# Patient Record
Sex: Male | Born: 1935 | Race: White | Hispanic: No | Marital: Married | Smoking: Never smoker
Health system: Southern US, Community
[De-identification: ages and names within clinical notes are randomized; demographics above are authoritative.]

## PROBLEM LIST (undated history)

## (undated) DIAGNOSIS — I739 Peripheral vascular disease, unspecified: Secondary | ICD-10-CM

## (undated) DIAGNOSIS — I2510 Atherosclerotic heart disease of native coronary artery without angina pectoris: Secondary | ICD-10-CM

## (undated) DIAGNOSIS — K4090 Unilateral inguinal hernia, without obstruction or gangrene, not specified as recurrent: Secondary | ICD-10-CM

## (undated) DIAGNOSIS — E785 Hyperlipidemia, unspecified: Secondary | ICD-10-CM

## (undated) DIAGNOSIS — I4891 Unspecified atrial fibrillation: Secondary | ICD-10-CM

## (undated) DIAGNOSIS — I701 Atherosclerosis of renal artery: Secondary | ICD-10-CM

## (undated) DIAGNOSIS — L570 Actinic keratosis: Secondary | ICD-10-CM

## (undated) DIAGNOSIS — M109 Gout, unspecified: Secondary | ICD-10-CM

## (undated) DIAGNOSIS — I10 Essential (primary) hypertension: Secondary | ICD-10-CM

## (undated) DIAGNOSIS — N183 Chronic kidney disease, stage 3 unspecified: Secondary | ICD-10-CM

## (undated) DIAGNOSIS — I6529 Occlusion and stenosis of unspecified carotid artery: Secondary | ICD-10-CM

## (undated) HISTORY — DX: Essential (primary) hypertension: I10

## (undated) HISTORY — DX: Actinic keratosis: L57.0

## (undated) HISTORY — PX: TONSILLECTOMY: SUR1361

## (undated) HISTORY — PX: OTHER SURGICAL HISTORY: SHX169

## (undated) HISTORY — PX: PROSTATE SURGERY: SHX751

---

## 1993-04-20 DIAGNOSIS — C61 Malignant neoplasm of prostate: Secondary | ICD-10-CM

## 1993-04-20 HISTORY — PX: PROSTATECTOMY: SHX69

## 1993-04-20 HISTORY — DX: Malignant neoplasm of prostate: C61

## 1998-04-20 HISTORY — PX: CORONARY ANGIOPLASTY WITH STENT PLACEMENT: SHX49

## 2004-07-01 ENCOUNTER — Ambulatory Visit: Payer: Self-pay | Admitting: Internal Medicine

## 2006-01-28 IMAGING — US US CAROTID DUPLEX BILAT
1 series · 17 of 24 positions shown · non-contrast
Comparison: none

REASON FOR EXAM: Stenosis
COMMENTS:

[Series 1: us carotid duplex bilat · 17 of 71 slices shown]
[im 1/71]
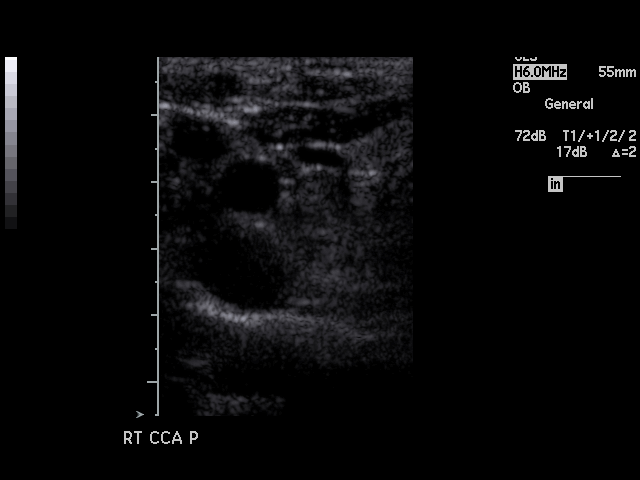
[im 7/71]
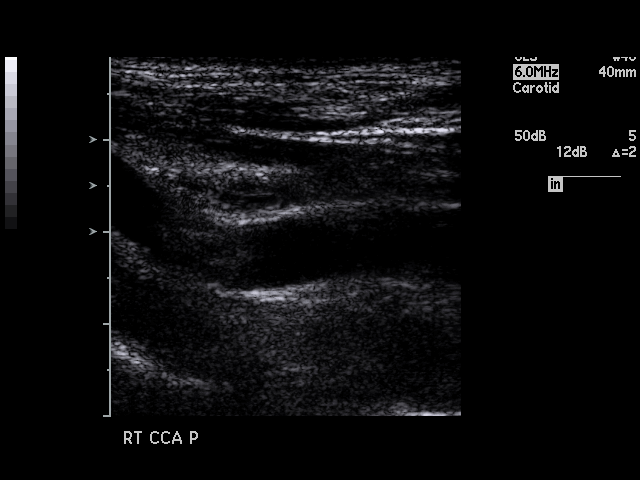
[im 10/71]
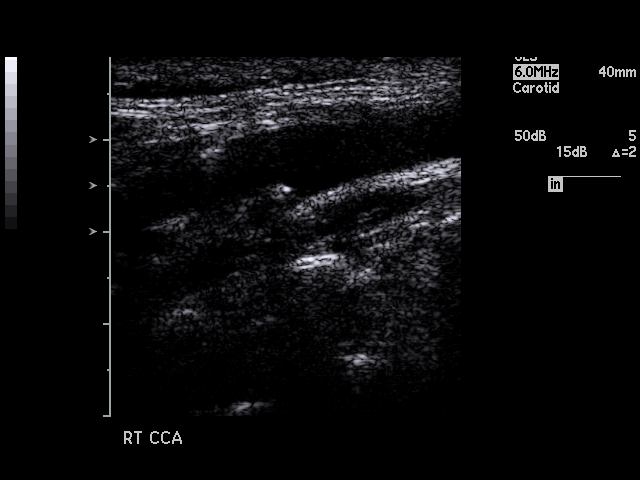
[im 13/71]
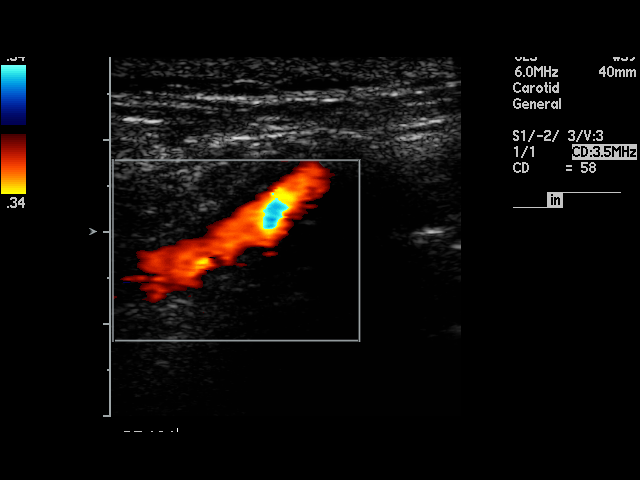
[im 19/71]
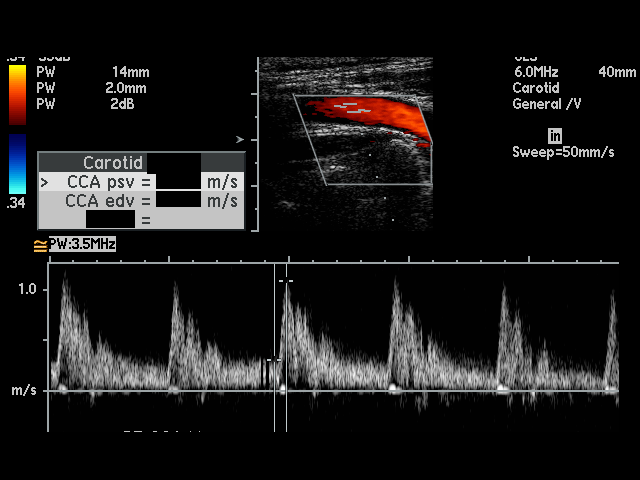
[im 22/71]
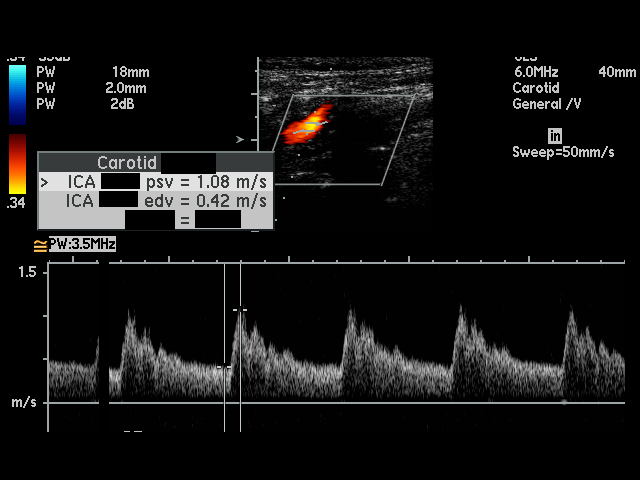
[im 28/71]
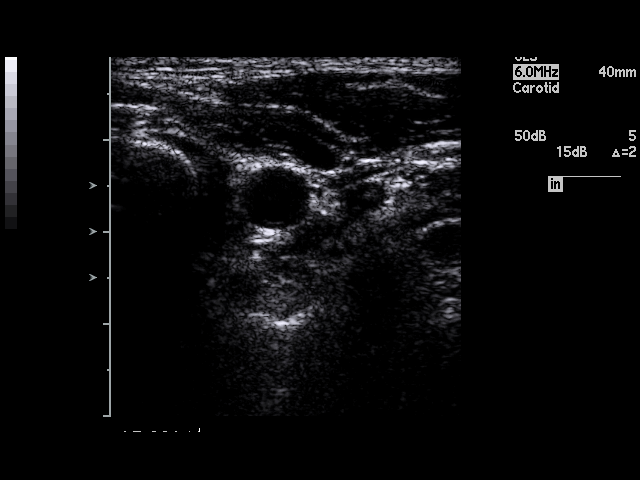
[im 31/71]
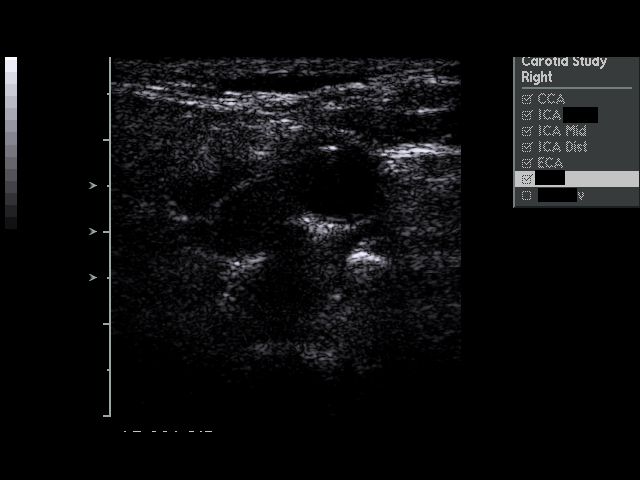
[im 37/71]
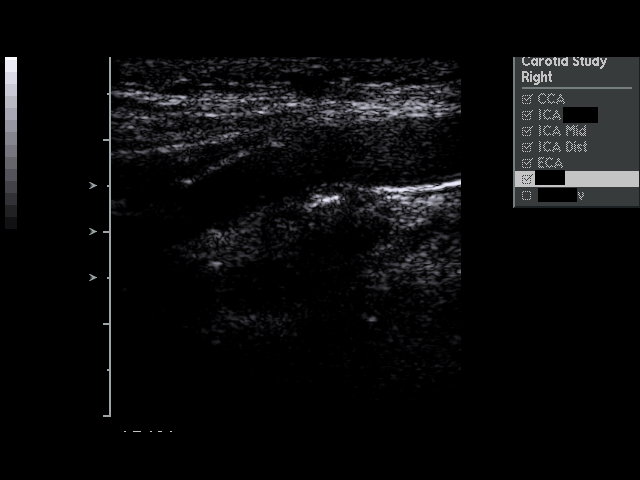
[im 40/71]
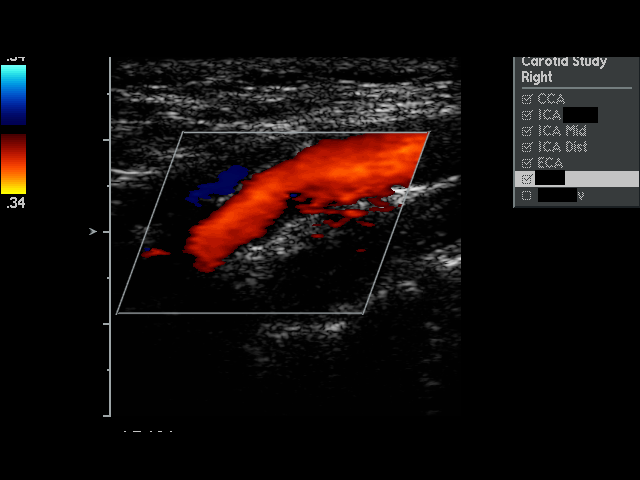
[im 43/71]
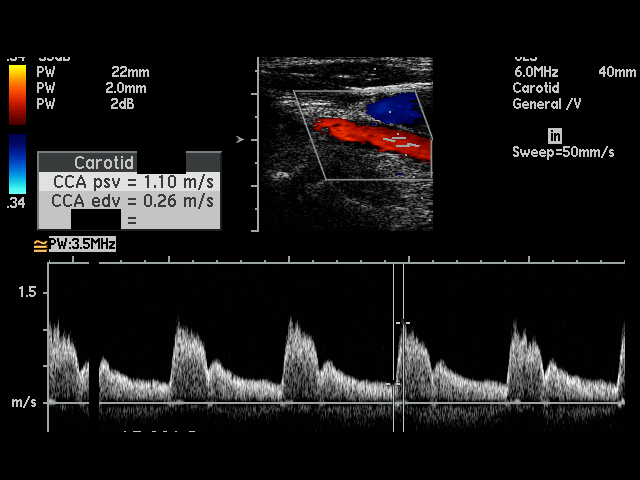
[im 49/71]
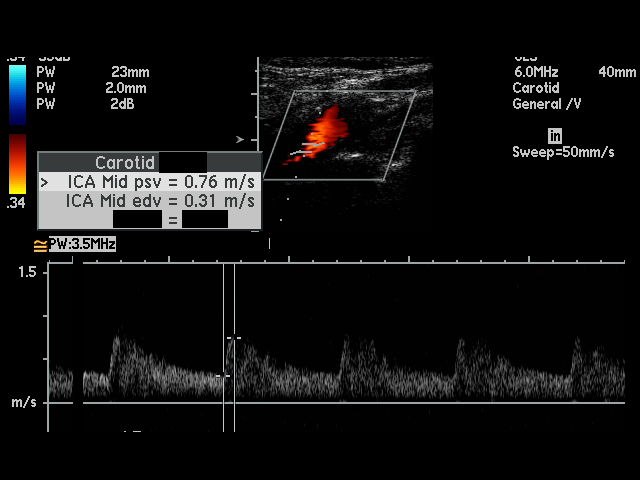
[im 52/71]
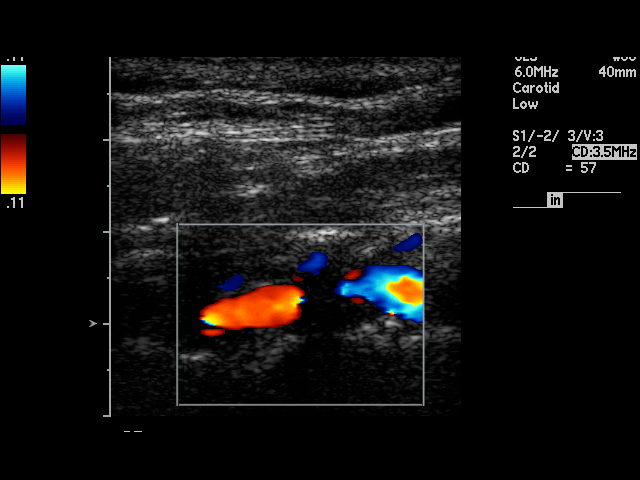
[im 58/71]
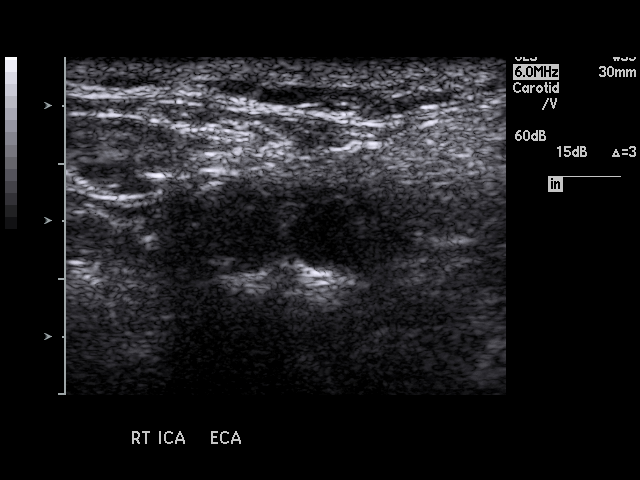
[im 61/71]
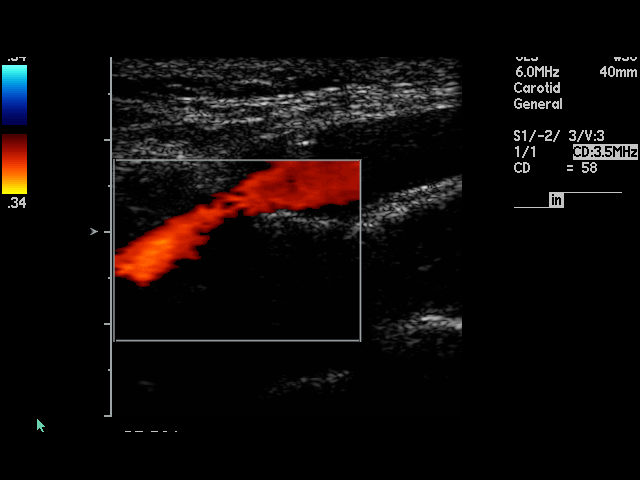
[im 64/71]
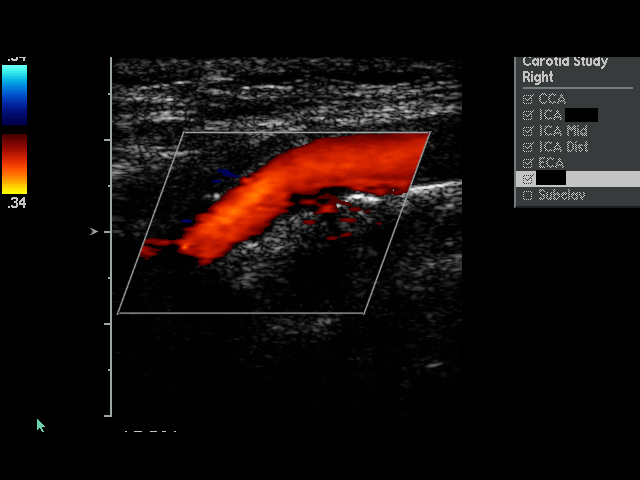
[im 71/71]
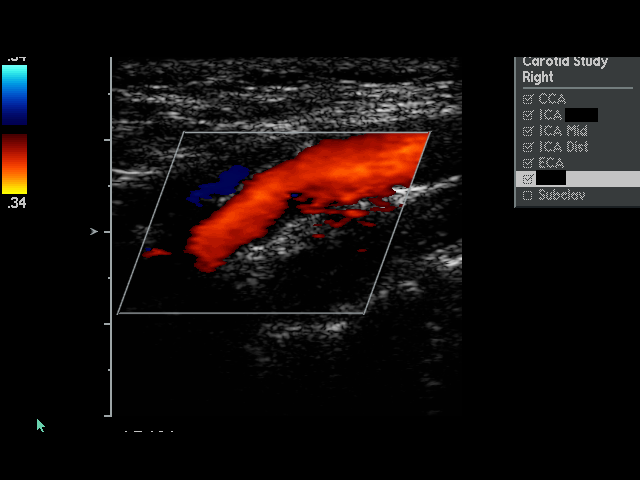

[17 of 24 positions shown; findings below may reference images not displayed]

PROCEDURE:     US  - US CAROTID DOPPLER BILATERAL  - July 01, 2004 [DATE]

RESULT:     There is noted mild calcific plaque formation about the carotid
bifurcations bilaterally.  Also noted is mild soft plaque formation in the
RIGHT internal carotid artery.  On the RIGHT the peak RIGHT common carotid
artery flow velocity measures 1.09 meters per second and the peak RIGHT
internal common carotid artery flow velocity measures 1.08 meters per
second.  The ICA over CCA ratio is 0.985.  On the LEFT the peak LEFT common
carotid artery flow velocity measures 1.17 meters per second and the peak
LEFT internal carotid artery flow velocity measures .770 meters per second.
ICA over CCA ratio is 0.658.  These values bilaterally are compatible with
the absence of hemodynamically significant stenosis.

Antegrade flow is noted in both vertebrals.
IMPRESSION: Mild plaque formation is seen bilaterally.

No hemodynamically significant stenosis is observed on either side.

Antegrade flow is noted in both vertebrals.

## 2006-06-09 ENCOUNTER — Ambulatory Visit: Payer: Self-pay | Admitting: Internal Medicine

## 2013-12-25 ENCOUNTER — Inpatient Hospital Stay: Payer: Self-pay | Admitting: Internal Medicine

## 2013-12-25 DIAGNOSIS — I214 Non-ST elevation (NSTEMI) myocardial infarction: Secondary | ICD-10-CM

## 2013-12-25 HISTORY — DX: Non-ST elevation (NSTEMI) myocardial infarction: I21.4

## 2013-12-25 LAB — BASIC METABOLIC PANEL
Anion Gap: 8 (ref 7–16)
BUN: 28 mg/dL — ABNORMAL HIGH (ref 7–18)
CREATININE: 1.57 mg/dL — AB (ref 0.60–1.30)
Calcium, Total: 9 mg/dL (ref 8.5–10.1)
Chloride: 104 mmol/L (ref 98–107)
Co2: 28 mmol/L (ref 21–32)
EGFR (African American): 49 — ABNORMAL LOW
GFR CALC NON AF AMER: 42 — AB
Glucose: 100 mg/dL — ABNORMAL HIGH (ref 65–99)
Osmolality: 285 (ref 275–301)
Potassium: 3.4 mmol/L — ABNORMAL LOW (ref 3.5–5.1)
Sodium: 140 mmol/L (ref 136–145)

## 2013-12-25 LAB — LIPID PANEL
Cholesterol: 261 mg/dL — ABNORMAL HIGH (ref 0–200)
HDL Cholesterol: 39 mg/dL — ABNORMAL LOW (ref 40–60)
Ldl Cholesterol, Calc: 178 mg/dL — ABNORMAL HIGH (ref 0–100)
TRIGLYCERIDES: 221 mg/dL — AB (ref 0–200)
VLDL Cholesterol, Calc: 44 mg/dL — ABNORMAL HIGH (ref 5–40)

## 2013-12-25 LAB — CK-MB
CK-MB: 4.4 ng/mL — AB (ref 0.5–3.6)
CK-MB: 72.6 ng/mL — ABNORMAL HIGH (ref 0.5–3.6)
CK-MB: 56.6 ng/mL — ABNORMAL HIGH (ref 0.5–3.6)

## 2013-12-25 LAB — CBC
HCT: 44.6 % (ref 40.0–52.0)
HGB: 14.8 g/dL (ref 13.0–18.0)
MCH: 30.4 pg (ref 26.0–34.0)
MCHC: 33.1 g/dL (ref 32.0–36.0)
MCV: 92 fL (ref 80–100)
PLATELETS: 215 10*3/uL (ref 150–440)
RBC: 4.85 10*6/uL (ref 4.40–5.90)
RDW: 13.2 % (ref 11.5–14.5)
WBC: 7.2 10*3/uL (ref 3.8–10.6)

## 2013-12-25 LAB — TROPONIN I
TROPONIN-I: 0.37 ng/mL — AB
Troponin-I: 20 ng/mL — ABNORMAL HIGH
Troponin-I: 29 ng/mL — ABNORMAL HIGH

## 2013-12-25 LAB — PROTIME-INR
INR: 1
Prothrombin Time: 12.6 secs (ref 11.5–14.7)

## 2013-12-25 LAB — APTT: Activated PTT: 30.9 secs (ref 23.6–35.9)

## 2013-12-26 LAB — CBC WITH DIFFERENTIAL/PLATELET
Basophil #: 0 10*3/uL (ref 0.0–0.1)
Basophil %: 0.3 %
EOS ABS: 0.2 10*3/uL (ref 0.0–0.7)
Eosinophil %: 1.9 %
HCT: 39.4 % — ABNORMAL LOW (ref 40.0–52.0)
HGB: 13.3 g/dL (ref 13.0–18.0)
Lymphocyte #: 1.1 10*3/uL (ref 1.0–3.6)
Lymphocyte %: 13.2 %
MCH: 30.7 pg (ref 26.0–34.0)
MCHC: 33.8 g/dL (ref 32.0–36.0)
MCV: 91 fL (ref 80–100)
Monocyte #: 0.6 x10 3/mm (ref 0.2–1.0)
Monocyte %: 7.6 %
NEUTROS ABS: 6.4 10*3/uL (ref 1.4–6.5)
Neutrophil %: 77 %
Platelet: 243 10*3/uL (ref 150–440)
RBC: 4.35 10*6/uL — ABNORMAL LOW (ref 4.40–5.90)
RDW: 13.6 % (ref 11.5–14.5)
WBC: 8.3 10*3/uL (ref 3.8–10.6)

## 2013-12-26 LAB — BASIC METABOLIC PANEL
Anion Gap: 7 (ref 7–16)
BUN: 30 mg/dL — AB (ref 7–18)
CALCIUM: 8.5 mg/dL (ref 8.5–10.1)
CHLORIDE: 105 mmol/L (ref 98–107)
CO2: 30 mmol/L (ref 21–32)
Creatinine: 1.85 mg/dL — ABNORMAL HIGH (ref 0.60–1.30)
EGFR (African American): 40 — ABNORMAL LOW
EGFR (Non-African Amer.): 34 — ABNORMAL LOW
GLUCOSE: 115 mg/dL — AB (ref 65–99)
Osmolality: 290 (ref 275–301)
POTASSIUM: 4.3 mmol/L (ref 3.5–5.1)
Sodium: 142 mmol/L (ref 136–145)

## 2013-12-26 LAB — HEPARIN LEVEL (UNFRACTIONATED)
ANTI-XA(UNFRACTIONATED): 0.32 [IU]/mL (ref 0.30–0.70)
ANTI-XA(UNFRACTIONATED): 0.2 [IU]/mL — AB (ref 0.30–0.70)

## 2013-12-27 HISTORY — PX: CARDIAC CATHETERIZATION: SHX172

## 2013-12-27 LAB — BASIC METABOLIC PANEL
Anion Gap: 7 (ref 7–16)
BUN: 22 mg/dL — ABNORMAL HIGH (ref 7–18)
Calcium, Total: 7.8 mg/dL — ABNORMAL LOW (ref 8.5–10.1)
Chloride: 109 mmol/L — ABNORMAL HIGH (ref 98–107)
Co2: 26 mmol/L (ref 21–32)
Creatinine: 1.55 mg/dL — ABNORMAL HIGH (ref 0.60–1.30)
EGFR (African American): 49 — ABNORMAL LOW
GFR CALC NON AF AMER: 43 — AB
GLUCOSE: 94 mg/dL (ref 65–99)
Osmolality: 286 (ref 275–301)
POTASSIUM: 3.7 mmol/L (ref 3.5–5.1)
SODIUM: 142 mmol/L (ref 136–145)

## 2013-12-27 LAB — CBC WITH DIFFERENTIAL/PLATELET
Basophil #: 0 10*3/uL (ref 0.0–0.1)
Basophil %: 0.4 %
EOS ABS: 0.2 10*3/uL (ref 0.0–0.7)
Eosinophil %: 2.4 %
HCT: 36.6 % — ABNORMAL LOW (ref 40.0–52.0)
HGB: 12 g/dL — AB (ref 13.0–18.0)
Lymphocyte #: 1.1 10*3/uL (ref 1.0–3.6)
Lymphocyte %: 15.3 %
MCH: 30.4 pg (ref 26.0–34.0)
MCHC: 32.9 g/dL (ref 32.0–36.0)
MCV: 93 fL (ref 80–100)
Monocyte #: 0.6 x10 3/mm (ref 0.2–1.0)
Monocyte %: 8.2 %
NEUTROS PCT: 73.7 %
Neutrophil #: 5.4 10*3/uL (ref 1.4–6.5)
Platelet: 196 10*3/uL (ref 150–440)
RBC: 3.96 10*6/uL — ABNORMAL LOW (ref 4.40–5.90)
RDW: 13.2 % (ref 11.5–14.5)
WBC: 7.3 10*3/uL (ref 3.8–10.6)

## 2013-12-27 LAB — APTT: Activated PTT: 74 secs — ABNORMAL HIGH (ref 23.6–35.9)

## 2013-12-27 LAB — HEPARIN LEVEL (UNFRACTIONATED): ANTI-XA(UNFRACTIONATED): 0.19 [IU]/mL — AB (ref 0.30–0.70)

## 2013-12-28 LAB — CBC WITH DIFFERENTIAL/PLATELET
BASOS PCT: 0.4 %
Basophil #: 0 10*3/uL (ref 0.0–0.1)
Eosinophil #: 0.2 10*3/uL (ref 0.0–0.7)
Eosinophil %: 3.7 %
HCT: 35.1 % — ABNORMAL LOW (ref 40.0–52.0)
HGB: 11.8 g/dL — ABNORMAL LOW (ref 13.0–18.0)
LYMPHS ABS: 0.9 10*3/uL — AB (ref 1.0–3.6)
LYMPHS PCT: 15.6 %
MCH: 30.8 pg (ref 26.0–34.0)
MCHC: 33.7 g/dL (ref 32.0–36.0)
MCV: 92 fL (ref 80–100)
Monocyte #: 0.5 x10 3/mm (ref 0.2–1.0)
Monocyte %: 7.8 %
NEUTROS PCT: 72.5 %
Neutrophil #: 4.3 10*3/uL (ref 1.4–6.5)
Platelet: 176 10*3/uL (ref 150–440)
RBC: 3.83 10*6/uL — ABNORMAL LOW (ref 4.40–5.90)
RDW: 13.4 % (ref 11.5–14.5)
WBC: 5.9 10*3/uL (ref 3.8–10.6)

## 2013-12-28 LAB — BASIC METABOLIC PANEL
Anion Gap: 7 (ref 7–16)
BUN: 17 mg/dL (ref 7–18)
CALCIUM: 8 mg/dL — AB (ref 8.5–10.1)
CREATININE: 1.39 mg/dL — AB (ref 0.60–1.30)
Chloride: 109 mmol/L — ABNORMAL HIGH (ref 98–107)
Co2: 27 mmol/L (ref 21–32)
EGFR (African American): 56 — ABNORMAL LOW
EGFR (Non-African Amer.): 49 — ABNORMAL LOW
GLUCOSE: 96 mg/dL (ref 65–99)
Osmolality: 286 (ref 275–301)
Potassium: 3.3 mmol/L — ABNORMAL LOW (ref 3.5–5.1)
Sodium: 143 mmol/L (ref 136–145)

## 2014-05-14 DIAGNOSIS — C61 Malignant neoplasm of prostate: Secondary | ICD-10-CM | POA: Insufficient documentation

## 2014-08-11 NOTE — Consult Note (Signed)
Brief Consult Note: Diagnosis: NQMI/[place]/CAD.   Patient was seen by consultant.   Consult note dictated.   Recommend further assessment or treatment.   Orders entered.   Discussed with Attending MD.   Comments: IMP NQMI CAD [place] Hyperlipidemia HTN ASA allergy . PLAN Tele Plavix load and maintence B-blocker therapy No ACE because of allergy No asa because of allergy Statin therapy Aggrastat pre cath Rec cardiac cath tomorrow Bp control b-blocker and amilodapine Consider ARB? Agree with heparin IV Hydration pre cath.  Electronic Signatures: Lujean Amel D (MD)  (Signed 07-Sep-15 20:39)  Authored: Brief Consult Note   Last Updated: 07-Sep-15 20:39 by Yolonda Kida (MD)

## 2014-08-11 NOTE — Discharge Summary (Signed)
PATIENT NAME:  Timothy Goodwin, Timothy Goodwin MR#:  [id number] DATE OF BIRTH:  1934/11/22  DATE OF ADMISSION:  12/25/2013 DATE OF DISCHARGE:  12/28/2013  DIAGNOSES AT TIME OF DISCHARGE:  1. Non-ST-elevation myocardial infarction.  2. Right renal artery stenosis.  3. Cardiac catheterization showing moderate to severe single vessel disease.  4. Acute on chronic renal failure.   CHIEF COMPLAINT: Chest pain.   HISTORY OF PRESENT ILLNESS: Timothy Goodwin is a 80 year old male with a history of CAD status post previous stent in 2000, history of hypertension, history of prostate cancer, who presented to the ED complaining of chest pain. The patient stated that he developed tightness in his chest that was non-radiating and subsequently felt hot, flushing and sweating for a few minutes. The patient reportedly is allergic to aspirin and received Plavix. EKG did not show any ST-T changes.   PAST MEDICAL HISTORY: Significant for CAD status post stent at [facility] in 2000, history of hypertension, prostate cancer.   PAST SURGICAL HISTORY: Significant for cardiac stent placement, prostate resection. Please see H and P for other details.   HOSPITAL COURSE: The patient was admitted to [facility], was seen by cardiologist Dr. Clayborn Bigness and underwent a cardiac catheterization. Cardiac catheterization showed EF of 55%, left main was normal, LAD showed 50% proximal, 50% mid, and 50% distal, diagonal showed 50% mid, circumflex was large with ectasia, proximal/mid 50% occlusion, RCA was nondominant, 50% mid occlusion, and OM 1 was 90% proximal with stent, and 50% distal with stent.  The renal arteries showed right renal artery 90% proximal stenosis. The patient was advised medical therapy with Plavix, statins, and beta blockers. During his stay in the hospital the patient did have elevated creatinine and was seen by nephrologist, Dr. Juleen China.  With fluids his serum creatinine came down to 1.39  just prior to cardiac catheterization. The  patient's enzymes were elevated with troponin going up as high as 29, initial troponin was 0.37, repeat troponin was 20, and final troponin level was 29.  The patient remained chest pain-free and was stable at the time of discharge. He was discharged on the following medications.    DISCHARGE MEDICATIONS:  Isosorbide mononitrate extended release 30 mg 1 tablet a day, simvastatin 20 mg once a day, clopidogrel 75 mg once a day, metoprolol tartrate 25 mg p.o. b.i.d., and allopurinol as before. He was advised to hold his HCTZ.   FOLLOWUP:  He has been advised to follow up with Dr. Clayborn Bigness, cardiologist with me, Dr. Ginette Pitman, and also follow up with Dr. Juleen China, nephrologist. The patient is stable at the time of discharge.   TOTAL TIME SPENT IN DISCHARGING THE PATIENT: 35 minutes.     ____________________________ Tracie Harrier, MD vh:bu D: 12/28/2013 17:16:40 ET T: 12/28/2013 18:23:01 ET JOB#: [id number]  cc: Tracie Harrier, MD, <Dictator> Tracie Harrier MD ELECTRONICALLY SIGNED 01/09/2014 17:55

## 2014-08-11 NOTE — H&P (Signed)
PATIENT NAME:  Timothy Goodwin, Timothy Goodwin MR#:  [id number] DATE OF BIRTH:  1934-11-09  DATE OF ADMISSION:  12/25/2013  ADMITTING PHYSICIAN: Gladstone Lighter, MD   PRIMARY CARE PHYSICIAN: Tracie Harrier, MD  CHIEF COMPLAINT: Chest pain.   HISTORY OF PRESENT ILLNESS: Timothy Goodwin is a 80 year old Caucasian male with past medical history significant for coronary artery disease, status post stent in the year 2000, hypertension, history of prostate cancer, presents from home secondary to chest pain that started this morning. The patient says that he was doing fine this morning. He woke up, he did not have any symptoms, ate his breakfast. Around 11:00 this morning he suddenly developed tightness in his chest that was nonradiating, but he felt hot, flushy and sweaty for a couple of minutes. The other symptoms improved but the tightness stayed for almost a couple of hours, he presented to the ER. His first troponin is positive at 0.37. THE PATIENT IS ANAPHYLACTIC TO ASPIRIN. So he has been getting a loading dose of Plavix at this time and is being admitted to cardiac floor for non-STEMI. His EKG does not show any acute ST-T wave changes. His chest pain is much improved at this time.   PAST MEDICAL HISTORY:  1.  Coronary artery disease, status post stent, likely in his circumflex artery in the year 2000 at [facility].  2.  Hypertension.  3.  Prostate cancer.   PAST SURGICAL HISTORY:  1.  Cardiac stent placement.  2.  Prostate resection.   ALLERGIES TO MEDICATIONS:  1.  HE IS ANAPHYLACTIC TO ASPIRIN AS IT CAUSED ANGIOEDEMA, IT WAS PROVED BY ALLERGY TESTING BY IMMUNOLOGIST.  2.  ANAPHYLACTIC TO NONSTEROIDAL ANTI-INFLAMMATORY ALSO AND WAS ALSO ADVISED TO STAY AWAY FROM ACE INHIBITORS.  CURRENT HOME MEDICATIONS:  1.  Hydrochlorothiazide. He takes 1-1/2 pills per day, does not know the strength.  2.  Allopurinol for gout symptoms, 1 tablet daily, unknown strength.   SOCIAL HISTORY: Lives at home with his wife.  No smoking or alcohol use.   FAMILY HISTORY: Both parents passed away from cancer. His mom had ovarian cancer and dad had prostate cancer.   REVIEW OF SYSTEMS:  CONSTITUTIONAL: No fever, fatigue or weakness.  EYES: No blurred vision, double vision, inflammation or glaucoma. Uses reading glasses.  EARS, NOSE AND THROAT: No tinnitus, ear pain, hearing loss, epistaxis or discharge.  RESPIRATORY: No cough, wheeze, hemoptysis or COPD.  CARDIOVASCULAR: Positive for chest pain. No orthopnea, edema, arrhythmia, palpitations or syncope.  GASTROINTESTINAL: No nausea, vomiting, diarrhea, abdominal pain, hematemesis or melena.  GENITOURINARY: No dysuria, hematuria, renal calculus, frequency or incontinence.  ENDOCRINE: No polyuria, nocturia or thyroid problems, heat or cold intolerance.  HEMATOLOGY: No anemia, easy bruising or bleeding.  SKIN: No acne, rash or lesions.  MUSCULOSKELETAL: No neck, back, shoulder pain, arthritis or gout.  NEUROLOGIC: No numbness, weakness, CVA, TIA or seizure.  PSYCHOLOGIC: No anxiety, insomnia, depression.   PHYSICAL EXAMINATION:  VITAL SIGNS: Temperature 98.2 degrees Fahrenheit, pulse 72, respirations 18, blood pressure 208/105, pulse oximetry 97% on room air.  GENERAL: Well-built, well-nourished male, sitting in bed, not in any acute distress.  HEENT: Normocephalic, atraumatic. Pupils equal, round, reacting to light. Anicteric sclerae. Extraocular movements intact. Oropharynx is clear without erythema, mass or exudates.  NECK: Supple. No thyromegaly, JVD or carotid bruits. No lymphadenopathy.  LUNGS: Moving air bilaterally. No wheeze or crackles. No use of accessory muscles for breathing.  CARDIOVASCULAR: S1, S2, regular rate and rhythm. No murmurs, rubs or gallops.  ABDOMEN:  Soft, nontender, nondistended. No hepatosplenomegaly. Normal bowel sounds.  EXTREMITIES: No pedal edema. No clubbing or cyanosis, 2+ dorsalis pedis pulses palpable bilaterally.  SKIN: No acne,  rash or lesions.  LYMPHATICS: No cervical lymphadenopathy.  NEUROLOGIC: Cranial nerves intact. No focal motor or sensory deficits.  PSYCHOLOGICAL: The patient is awake, alert, oriented x 3.   LABORATORY DATA: WBC 7.2, hemoglobin 14.8, hematocrit 44.6, platelet count 215,000.   Sodium 140, potassium 3.4, chloride 104, bicarbonate 28, BUN 28, creatinine 1.52 and glucose 100 and calcium of 9.0. CK-MB 4.4. Troponin 0.37.   Chest x-ray: Clear lung fields, thoracic spondylosis and tortuous aorta with atherosclerotic calcifications seen but no acute findings. EKG is normal sinus rhythm, no acute ST-T wave abnormalities. Heart rate of 75.   ASSESSMENT AND PLAN: A 80 year old male with prior history of coronary artery disease, status post stents, hypertension is being admitted for non-ST segment elevation myocardial infarction.  1.  Non- ST segment elevation myocardial infarction. We will admit the patient to telemetry. Recycle troponins. Started on heparin drip. Cardiology has been consulted. Dr. Clayborn Bigness saw the patient and recommended cardiac catheterization tomorrow morning. THE PATIENT HAS AN ANAPHYLACTIC REACTION TO ASPIRIN, SO WE ARE GIVING PLAVIX LOADING DOSE TODAY AND DAILY FROM TOMORROW. Imdur has been added for the nitroglycerin complement. Metoprolol statin, also started. Lipid profile followup and echocardiogram has been ordered.  2.  Hypertension. Hold hydrochlorothiazide as he is hypokalemic and mild renal failure. Start IV fluids, IV hydralazine, p.r.n. metoprolol and Imdur.  3.  Acute renal failure and hypokalemia, likely from hydrochlorothiazide. Hold hydrochlorothiazide. IV fluids and recheck.  4.  History of prostate cancer, has been treated in the past.   5.  Deep veinous thrombosis prophylaxis.   CODE STATUS: Full code.   TIME SPENT ON ADMISSION: Fifty minutes.    ____________________________ Gladstone Lighter, MD rk:TT D: 12/25/2013 15:15:00 ET T: 12/25/2013 15:41:47  ET JOB#: [id number]  cc: Gladstone Lighter, MD, <Dictator> Tracie Harrier, MD Gladstone Lighter MD ELECTRONICALLY SIGNED 12/25/2013 17:42

## 2014-11-12 DIAGNOSIS — N1832 Chronic kidney disease, stage 3b: Secondary | ICD-10-CM | POA: Insufficient documentation

## 2014-11-12 DIAGNOSIS — N183 Chronic kidney disease, stage 3 unspecified: Secondary | ICD-10-CM | POA: Insufficient documentation

## 2016-01-15 ENCOUNTER — Other Ambulatory Visit: Payer: Self-pay | Admitting: Internal Medicine

## 2016-01-15 ENCOUNTER — Ambulatory Visit
Admission: RE | Admit: 2016-01-15 | Discharge: 2016-01-15 | Disposition: A | Discharge status: Home / Self Care | Payer: Medicare Other | Source: Ambulatory Visit | Attending: Internal Medicine | Admitting: Internal Medicine

## 2016-01-15 DIAGNOSIS — M7989 Other specified soft tissue disorders: Secondary | ICD-10-CM | POA: Diagnosis present

## 2016-06-04 ENCOUNTER — Other Ambulatory Visit (INDEPENDENT_AMBULATORY_CARE_PROVIDER_SITE_OTHER): Payer: Self-pay | Admitting: Vascular Surgery

## 2016-06-04 DIAGNOSIS — I701 Atherosclerosis of renal artery: Secondary | ICD-10-CM

## 2016-06-05 ENCOUNTER — Ambulatory Visit (INDEPENDENT_AMBULATORY_CARE_PROVIDER_SITE_OTHER): Payer: Medicare Other | Admitting: Vascular Surgery

## 2016-06-05 ENCOUNTER — Encounter (INDEPENDENT_AMBULATORY_CARE_PROVIDER_SITE_OTHER): Payer: Self-pay | Admitting: Vascular Surgery

## 2016-06-05 ENCOUNTER — Ambulatory Visit (INDEPENDENT_AMBULATORY_CARE_PROVIDER_SITE_OTHER): Payer: Medicare Other

## 2016-06-05 VITALS — BP 197/96 | HR 70 | Resp 16 | Ht 66.0 in | Wt 177.0 lb

## 2016-06-05 DIAGNOSIS — I10 Essential (primary) hypertension: Secondary | ICD-10-CM | POA: Insufficient documentation

## 2016-06-05 DIAGNOSIS — I701 Atherosclerosis of renal artery: Secondary | ICD-10-CM

## 2016-06-05 NOTE — Assessment & Plan Note (Signed)
His renal artery duplex today would suggest less than 60% renal artery stenosis bilaterally. These velocities are better than they were his last study one year ago. At this point, I will extend his ultrasound scheduled check him in 18-24 months. If he has worsening blood pressure in the interim, he will call our office.

## 2016-06-05 NOTE — Assessment & Plan Note (Signed)
His blood pressure control is good renal artery duplex does not demonstrate high-grade stenosis. Continue current medical regimen. Blood pressure control is important in reducing progression of atherosclerotic disease.

## 2016-06-05 NOTE — Progress Notes (Signed)
MRN : [id number]  Timothy Goodwin is a 82 y.o. (05/10/1934) male who presents with chief complaint of  Chief Complaint  Patient presents with  . Follow-up  .  History of Present Illness: Patient returns today in follow up of His hypertension and renal artery stenosis. He says his blood pressure has been very well controlled without significant issues. He is on 3 antihypertensives. As far as he knows, his renal function remains normal. His renal artery duplex today would suggest less than 60% renal artery stenosis bilaterally. These velocities are better than they were his last study one year ago.  Current Outpatient Prescriptions  Medication Sig Dispense Refill  . allopurinol (ZYLOPRIM) 100 MG tablet TAKE 1 TABLET DAILY    . amLODipine (NORVASC) 10 MG tablet Take by mouth.    . calcium carbonate 1250 MG capsule Take by mouth.    . cloNIDine (CATAPRES) 0.1 MG tablet Take by mouth.    . clopidogrel (PLAVIX) 75 MG tablet     . isosorbide mononitrate (IMDUR) 30 MG 24 hr tablet     . magnesium 30 MG tablet Take 30 mg by mouth 2 (two) times daily.    . metoprolol tartrate (LOPRESSOR) 25 MG tablet     . Multiple Vitamin (MULTIVITAMIN) capsule Take 1 capsule by mouth daily.    . TURMERIC PO Take by mouth.    . vitamin C (ASCORBIC ACID) 500 MG tablet Take by mouth.    . Vitamin D, Ergocalciferol, (DRISDOL) 50000 units CAPS capsule     . vitamin E 200 UNIT capsule Take by mouth.     No current facility-administered medications for this visit.     Past Medical History:  Diagnosis Date  . Hypertension     Past Surgical History:  Procedure Laterality Date  . heart stent placement    . PROSTATE SURGERY      Social History Social History  Substance Use Topics  . Smoking status: Never Smoker  . Smokeless tobacco: Never Used  . Alcohol use Yes     Comment: occasionally      Family History No bleeding or clotting disorders  Allergies  Allergen Reactions  . Aspirin Shortness Of  Breath  . Lisinopril Shortness Of Breath  . Sulfa Antibiotics Swelling and Rash     REVIEW OF SYSTEMS (Negative unless checked)  Constitutional: [] Weight loss  [] Fever  [] Chills Cardiac: [] Chest pain   [] Chest pressure   [] Palpitations   [] Shortness of breath when laying flat   [] Shortness of breath at rest   [] Shortness of breath with exertion. Vascular:  [] Pain in legs with walking   [] Pain in legs at rest   [] Pain in legs when laying flat   [] Claudication   [] Pain in feet when walking  [] Pain in feet at rest  [] Pain in feet when laying flat   [] History of DVT   [] Phlebitis   [] Swelling in legs   [] Varicose veins   [] Non-healing ulcers Pulmonary:   [] Uses home oxygen   [] Productive cough   [] Hemoptysis   [] Wheeze  [] COPD   [] Asthma Neurologic:  [] Dizziness  [] Blackouts   [] Seizures   [] History of stroke   [] History of TIA  [] Aphasia   [] Temporary blindness   [] Dysphagia   [] Weakness or numbness in arms   [] Weakness or numbness in legs Musculoskeletal:  [x] Arthritis   [] Joint swelling   [] Joint pain   [] Low back pain Hematologic:  [] Easy bruising  [] Easy bleeding   [] Hypercoagulable state   []   Anemic   Gastrointestinal:  [] Blood in stool   [] Vomiting blood  [] Gastroesophageal reflux/heartburn   [] Abdominal pain Genitourinary:  [] Chronic kidney disease   [] Difficult urination  [] Frequent urination  [] Burning with urination   [] Hematuria Skin:  [] Rashes   [] Ulcers   [] Wounds Psychological:  [] History of anxiety   []  History of major depression.  Physical Examination  BP (!) 197/96   Pulse 70   Resp 16   Ht 5\' 6"  (1.676 m)   Wt 177 lb (80.3 kg)   BMI 28.57 kg/m  Gen:  WD/WN, NAD. Appears younger than stated age Head: [place]/AT, No temporalis wasting. Ear/Nose/Throat: Hearing grossly intact, nares w/o erythema or drainage, trachea midline Eyes: Conjunctiva clear. Sclera non-icteric Neck: Supple.  No JVD.  Pulmonary:  Good air movement, no use of accessory muscles.  Cardiac: RRR, normal S1,  S2 Vascular:  Vessel Right Left  Radial Palpable Palpable                                   Gastrointestinal: soft, non-tender/non-distended.  Musculoskeletal: M/S 5/5 throughout.  No deformity or atrophy.  Neurologic: Sensation grossly intact in extremities.  Symmetrical.  Speech is fluent.  Psychiatric: Judgment intact, Mood & affect appropriate for pt's clinical situation. Dermatologic: No rashes or ulcers noted.  No cellulitis or open wounds. Lymph : No Cervical, Axillary, or Inguinal lymphadenopathy.      Labs No results found for this or any previous visit (from the past 2160 hour(s)).  Radiology No results found.    Assessment/Plan  Essential hypertension His blood pressure control is good renal artery duplex does not demonstrate high-grade stenosis. Continue current medical regimen. Blood pressure control is important in reducing progression of atherosclerotic disease.  Renal artery stenosis (HCC) His renal artery duplex today would suggest less than 60% renal artery stenosis bilaterally. These velocities are better than they were his last study one year ago. At this point, I will extend his ultrasound scheduled check him in 18-24 months. If he has worsening blood pressure in the interim, he will call our office.    Leotis Pain, MD  06/05/2016 9:13 AM    This note was created with Dragon medical transcription system.  Any errors from dictation are purely unintentional

## 2017-04-20 HISTORY — PX: SMALL BOWEL ENTEROSCOPY: SHX2415

## 2018-03-28 ENCOUNTER — Other Ambulatory Visit: Payer: Self-pay

## 2018-03-28 ENCOUNTER — Emergency Department: Payer: Medicare PPO

## 2018-03-28 ENCOUNTER — Inpatient Hospital Stay
Admission: EM | Admit: 2018-03-28 | Discharge: 2018-03-31 | DRG: 281 | Disposition: A | Discharge status: Other Acute Inpatient Hospital | Payer: Medicare PPO | Attending: Internal Medicine | Admitting: Internal Medicine

## 2018-03-28 DIAGNOSIS — E785 Hyperlipidemia, unspecified: Secondary | ICD-10-CM | POA: Diagnosis present

## 2018-03-28 DIAGNOSIS — K219 Gastro-esophageal reflux disease without esophagitis: Secondary | ICD-10-CM | POA: Diagnosis present

## 2018-03-28 DIAGNOSIS — I161 Hypertensive emergency: Secondary | ICD-10-CM | POA: Diagnosis present

## 2018-03-28 DIAGNOSIS — I129 Hypertensive chronic kidney disease with stage 1 through stage 4 chronic kidney disease, or unspecified chronic kidney disease: Secondary | ICD-10-CM | POA: Diagnosis present

## 2018-03-28 DIAGNOSIS — Z882 Allergy status to sulfonamides status: Secondary | ICD-10-CM

## 2018-03-28 DIAGNOSIS — Z7902 Long term (current) use of antithrombotics/antiplatelets: Secondary | ICD-10-CM

## 2018-03-28 DIAGNOSIS — Z79899 Other long term (current) drug therapy: Secondary | ICD-10-CM

## 2018-03-28 DIAGNOSIS — Z955 Presence of coronary angioplasty implant and graft: Secondary | ICD-10-CM

## 2018-03-28 DIAGNOSIS — I214 Non-ST elevation (NSTEMI) myocardial infarction: Secondary | ICD-10-CM | POA: Diagnosis not present

## 2018-03-28 DIAGNOSIS — I25110 Atherosclerotic heart disease of native coronary artery with unstable angina pectoris: Secondary | ICD-10-CM | POA: Diagnosis present

## 2018-03-28 DIAGNOSIS — Z888 Allergy status to other drugs, medicaments and biological substances status: Secondary | ICD-10-CM

## 2018-03-28 DIAGNOSIS — N183 Chronic kidney disease, stage 3 (moderate): Secondary | ICD-10-CM | POA: Diagnosis present

## 2018-03-28 DIAGNOSIS — I2582 Chronic total occlusion of coronary artery: Secondary | ICD-10-CM | POA: Diagnosis present

## 2018-03-28 LAB — CBC
HEMATOCRIT: 44.8 % (ref 39.0–52.0)
Hemoglobin: 14.9 g/dL (ref 13.0–17.0)
MCH: 30 pg (ref 26.0–34.0)
MCHC: 33.3 g/dL (ref 30.0–36.0)
MCV: 90.3 fL (ref 80.0–100.0)
Platelets: 246 10*3/uL (ref 150–400)
RBC: 4.96 MIL/uL (ref 4.22–5.81)
RDW: 13 % (ref 11.5–15.5)
WBC: 5.6 10*3/uL (ref 4.0–10.5)
nRBC: 0 % (ref 0.0–0.2)

## 2018-03-28 LAB — PROTIME-INR
INR: 1.05
Prothrombin Time: 13.6 seconds (ref 11.4–15.2)

## 2018-03-28 LAB — BASIC METABOLIC PANEL
Anion gap: 8 (ref 5–15)
BUN: 27 mg/dL — ABNORMAL HIGH (ref 8–23)
CO2: 25 mmol/L (ref 22–32)
Calcium: 9.1 mg/dL (ref 8.9–10.3)
Chloride: 108 mmol/L (ref 98–111)
Creatinine, Ser: 1.75 mg/dL — ABNORMAL HIGH (ref 0.61–1.24)
GFR calc Af Amer: 41 mL/min — ABNORMAL LOW (ref >60)
GFR calc non Af Amer: 36 mL/min — ABNORMAL LOW (ref >60)
GLUCOSE: 156 mg/dL — AB (ref 70–99)
Potassium: 4.2 mmol/L (ref 3.5–5.1)
Sodium: 141 mmol/L (ref 135–145)

## 2018-03-28 LAB — TROPONIN I
Troponin I: 0.1 ng/mL (ref <0.03)
Troponin I: 0.22 ng/mL (ref <0.03)

## 2018-03-28 LAB — APTT: APTT: 33 s (ref 24–36)

## 2018-03-28 MED ORDER — HEPARIN SODIUM (PORCINE) 5000 UNIT/ML IJ SOLN
4000.0000 [IU] | Freq: Once | INTRAMUSCULAR | Status: DC
  Filled 2018-03-28: qty 1

## 2018-03-28 MED ORDER — HEPARIN BOLUS VIA INFUSION: 4000.0000 [IU] | Freq: Once | INTRAVENOUS | Status: DC

## 2018-03-28 MED ORDER — HEPARIN (PORCINE) 25000 UT/250ML-% IV SOLN
1000.0000 [IU]/h | INTRAVENOUS | Status: DC
  Administered 2018-03-28 – 2018-03-30 (×2): 1000 [IU]/h via INTRAVENOUS
  Filled 2018-03-28 (×3): qty 250

## 2018-03-28 NOTE — ED Triage Notes (Addendum)
Pt arrives to ED via POV from home with c/o intermittent, non-radiating, centralized CP x45 minutes that's been happening off and on x2 weeks. Pt denies any c/o SHOB, lightheadedness, or dizziness, no N/V. Pt reports presenting to his Cardiologist today for his s/x's but unable to get an appointment.

## 2018-03-28 NOTE — ED Notes (Signed)
Charge nurse notified of troponin results 0.10

## 2018-03-28 NOTE — ED Provider Notes (Signed)
[facility] Emergency Department Provider Note  ____________________________________________   First MD Initiated Contact with Patient 03/28/18 2123     (approximate)  I have reviewed the triage vital signs and the nursing notes.   HISTORY  Chief Complaint Chest Pain   HPI Timothy Goodwin is a 83 y.o. male who presents to the emergency department for treatment and evaluation of intermittent chest pain.   Pain has been intermittent for the past couple of weeks.  He states that other than today, the worst episode he had was on Saturday.  He called to schedule an appointment with Dr. call would who is his cardiologist but he was unable to get an appointment until Wednesday.  Pain did not subside after 45 minutes and he decided to come to the emergency department.  He has had no associated shortness of breath, lightheadedness, dizziness, nausea, vomiting, or radiating pain.  Patient has a history of non-STEMI, CAD, angina, hypertension, GERD, and chronic renal insufficiency.  Past Medical History:  Diagnosis Date  . Hypertension     Patient Active Problem List   Diagnosis Date Noted  . NSTEMI (non-ST elevated myocardial infarction) ([place]) 03/29/2018  . Essential hypertension 06/05/2016  . Renal artery stenosis ([place]) 06/05/2016    Past Surgical History:  Procedure Laterality Date  . heart stent placement    . PROSTATE SURGERY      Prior to Admission medications   Medication Sig Start Date End Date Taking? Authorizing Provider  allopurinol (ZYLOPRIM) 100 MG tablet TAKE 1 TABLET DAILY 03/27/16  Yes [provider]  amLODipine (NORVASC) 10 MG tablet Take 10 mg by mouth daily.  02/26/16  Yes [provider]  calcium carbonate 1250 MG capsule Take 1,250 mg by mouth daily.    Yes [provider]  cloNIDine (CATAPRES) 0.1 MG tablet Take 0.1 mg by mouth 2 (two) times daily.  03/16/16 03/29/18 Yes [provider]  clopidogrel  (PLAVIX) 75 MG tablet Take 75 mg by mouth daily.  03/30/16  Yes [provider]  isosorbide mononitrate (IMDUR) 30 MG 24 hr tablet  04/16/16  Yes [provider]  magnesium 30 MG tablet Take 30 mg by mouth 2 (two) times daily.   Yes [provider]  metoprolol tartrate (LOPRESSOR) 25 MG tablet Take 25 mg by mouth 2 (two) times daily.  06/01/16  Yes [provider]  Multiple Vitamin (MULTIVITAMIN) capsule Take 1 capsule by mouth daily.   Yes [provider]  TURMERIC PO Take by mouth.   Yes [provider]  vitamin C (ASCORBIC ACID) 500 MG tablet Take by mouth.   Yes [provider]  Vitamin D, Ergocalciferol, (DRISDOL) 50000 units CAPS capsule  04/22/16  Yes [provider]  vitamin E 200 UNIT capsule Take by mouth.   Yes [provider]    Allergies Aspirin; Lisinopril; and Sulfa antibiotics  No family history on file.  Social History Social History   Tobacco Use  . Smoking status: Never Smoker  . Smokeless tobacco: Never Used  Substance Use Topics  . Alcohol use: Yes    Comment: occasionally  . Drug use: No    Review of Systems  Constitutional: No fever/chills. Eyes: No visual changes. ENT: No sore throat. Cardiovascular: Positive for chest pain. Negative for pleuritic pain. Negative for palpitations. Negative for leg pain. Respiratory: Negative shortness of breath. Gastrointestinal: Negative abdominal pain. Negative for nausea, No vomiting.  No diarrhea.  No constipation. Genitourinary: Negative for dysuria.  Musculoskeletal: Negative for back pain.  Skin: Negative for rash, lesion, wound. Neurological: Negative for headaches, focal weakness or numbness. ___________________________________________   PHYSICAL EXAM:  VITAL SIGNS: ED Triage Vitals  Enc Vitals Group     BP 03/28/18 2003 (!) 207/89     Pulse Rate 03/28/18 2003 78     Resp 03/28/18 2003 18     Temp 03/28/18 2003 98.8 F (37.1  C)     Temp Source 03/28/18 2003 Oral     SpO2 03/28/18 2003 97 %     Weight 03/28/18 2004 165 lb (74.8 kg)     Height 03/28/18 2004 5\' 5"  (1.651 m)     Head Circumference --      Peak Flow --      Pain Score 03/28/18 2003 1     Pain Loc --      Pain Edu? --      Excl. in [place]? --     Constitutional: Alert and oriented. Well appearing and in no acute distress. Normal mental status. Eyes: Conjunctivae are normal. PERRL. Head: Atraumatic. Nose: No congestion/rhinnorhea. Mouth/Throat: Mucous membranes are moist.  Oropharynx non-erythematous. Tongue normal in size and color. Neck: No stridor. No carotid bruit appreciated on exam. Hematological/Lymphatic/Immunilogical: No cervical lymphadenopathy. Cardiovascular: Normal rate, regular rhythm. Grossly normal heart sounds.  Good peripheral circulation. Respiratory: Normal respiratory effort.  No retractions. Lungs CTAB. Gastrointestinal: Soft and nontender. No distention. No abdominal bruits. No CVA tenderness. Genitourinary: Exam deferred. Musculoskeletal: No lower extremity tenderness. No edema of extremities. Neurologic:  Normal speech and language. No gross focal neurologic deficits are appreciated. Skin:  Skin is warm, dry and intact. No rash noted. Psychiatric: Mood and affect are normal. Speech and behavior are normal.  ____________________________________________   LABS (all labs ordered are listed, but only abnormal results are displayed)  Labs Reviewed  BASIC METABOLIC PANEL - Abnormal; Notable for the following components:      Result Value   Glucose, Bld 156 (*)    BUN 27 (*)    Creatinine, Ser 1.75 (*)    GFR calc non Af Amer 36 (*)    GFR calc Af Amer 41 (*)    All other components within normal limits  TROPONIN I - Abnormal; Notable for the following components:   Troponin I 0.10 (*)    All other components within normal limits  TROPONIN I - Abnormal; Notable for the following components:   Troponin I 0.22 (*)     All other components within normal limits  CBC  APTT  PROTIME-INR  HEPARIN LEVEL (UNFRACTIONATED)   ____________________________________________  EKG  ED ECG REPORT I, Sherrie George, the attending physician, personally viewed and interpreted this ECG.   Date: 03/28/2018  EKG Time: 2003  Rate: 74  Rhythm: normal EKG, normal sinus rhythm, unchanged from previous tracings  Axis: Normal  Intervals:none  ST&T Change: No ST elevation  ____________________________________________  RADIOLOGY  ED MD interpretation:  No acute cardiopulmonary findings.  Official radiology report(s): Dg Chest 2 View  Result Date: 03/28/2018 CLINICAL DATA:  84 y/o M; intermittent, nonradiating, central chest pain. EXAM: CHEST - 2 VIEW COMPARISON:  12/25/2013 chest radiograph FINDINGS: Stable normal cardiac silhouette given projection and technique. Uncoiled aorta. Aortic atherosclerosis with calcification. Reticular and peripheral linear opacities of the lungs. No consolidation. No pleural effusion or pneumothorax. No acute osseous abnormality is evident. IMPRESSION: Mild interstitial edema. No consolidation. Aortic Atherosclerosis (ICD10-I70.0). Electronically Signed   By: Kristine Garbe M.D.   On:  03/28/2018 20:41    ____________________________________________   PROCEDURES  Procedure(s) performed: None  Procedures  Critical Care performed: CRITICAL CARE Performed by: Sherrie George   Total critical care time: 30 minutes  Critical care time was exclusive of separately billable procedures and treating other patients.  Critical care was necessary to treat or prevent imminent or life-threatening deterioration.  Critical care was time spent personally by me on the following activities: development of treatment plan with patient and/or surrogate as well as nursing, discussions with consultants, evaluation of patient's response to treatment, examination of patient, obtaining history from  patient or surrogate, ordering and performing treatments and interventions, ordering and review of laboratory studies, ordering and review of radiographic studies, pulse oximetry and re-evaluation of patient's condition.   ____________________________________________   INITIAL IMPRESSION / ASSESSMENT AND PLAN / ED COURSE 83 year old male presenting to the emergency department for treatment and evaluation of intermittent chest pain.  Currently, the patient is pain-free however troponin is noted to be elevated 0.10.  EKG shows no ST elevation.  Consult will be called to Dr. Clayborn Bigness. Plan at this time will be to repeat troponin around 11:00PM.   Consult with Dr. Clayborn Bigness. Heparin and admission advised. Patient and wife updated and agree to the plan. Patient is currently pain free and was advised to alert staff if any pain returns.     ____________________________________________   FINAL CLINICAL IMPRESSION(S) / ED DIAGNOSES  Final diagnoses:  NSTEMI (non-ST elevated myocardial infarction) [facility])     ED Discharge Orders    None       Note:  This document was prepared using Dragon voice recognition software and may include unintentional dictation errors.    Victorino Dike, FNP 03/29/18 [id number]    Arta Silence, MD 03/29/18 [phone]

## 2018-03-28 NOTE — Progress Notes (Signed)
ANTICOAGULATION CONSULT NOTE - Initial Consult  Pharmacy Consult for heparin Indication: chest pain/ACS  Allergies  Allergen Reactions  . Aspirin Shortness Of Breath  . Lisinopril Shortness Of Breath  . Sulfa Antibiotics Swelling and Rash    Patient Measurements: Height: 5\' 5"  (165.1 cm) Weight: 165 lb (74.8 kg) IBW/kg (Calculated) : 61.5 Heparin Dosing Weight: 75 kg  Vital Signs: Temp: 98.8 F (37.1 C) (12/09 2003) Temp Source: Oral (12/09 2003) BP: 166/78 (12/09 2103) Pulse Rate: 66 (12/09 2103)  Labs: Recent Labs    03/28/18 2007  HGB 14.9  HCT 44.8  PLT 246  CREATININE 1.75*  TROPONINI 0.10*    Estimated Creatinine Clearance: 31.3 mL/min (A) (by C-G formula based on SCr of 1.75 mg/dL (H)).   Medical History: Past Medical History:  Diagnosis Date  . Hypertension     Medications:  Scheduled:    Assessment: Patient arrives w/ intermittent CP, initial trop 0.10, EKG showing some ST abnormalities w/ peaked T wave. Patient not on PTA anticoagulation.  Goal of Therapy:  Heparin level 0.3-0.7 units/ml Monitor platelets by anticoagulation protocol: Yes   Plan:  Patient received heparin 4000 units IV x 1 Will start heparin rate at 1000 units/hr Will check an anti-Xa @ 0700 Baseline labs drawn Will monitor daily cbc's and adjust per anti-Xa.  Tobie Lords, PharmD, BCPS Clinical Pharmacist 03/28/2018

## 2018-03-29 ENCOUNTER — Other Ambulatory Visit: Payer: Self-pay

## 2018-03-29 ENCOUNTER — Inpatient Hospital Stay
Admit: 2018-03-29 | Discharge: 2018-03-29 | Disposition: A | Discharge status: Home / Self Care | Payer: Medicare PPO | Attending: Internal Medicine | Admitting: Internal Medicine

## 2018-03-29 DIAGNOSIS — I161 Hypertensive emergency: Secondary | ICD-10-CM | POA: Diagnosis present

## 2018-03-29 DIAGNOSIS — Z7902 Long term (current) use of antithrombotics/antiplatelets: Secondary | ICD-10-CM | POA: Diagnosis not present

## 2018-03-29 DIAGNOSIS — E785 Hyperlipidemia, unspecified: Secondary | ICD-10-CM | POA: Diagnosis present

## 2018-03-29 DIAGNOSIS — I2582 Chronic total occlusion of coronary artery: Secondary | ICD-10-CM | POA: Diagnosis present

## 2018-03-29 DIAGNOSIS — I129 Hypertensive chronic kidney disease with stage 1 through stage 4 chronic kidney disease, or unspecified chronic kidney disease: Secondary | ICD-10-CM | POA: Diagnosis present

## 2018-03-29 DIAGNOSIS — I25110 Atherosclerotic heart disease of native coronary artery with unstable angina pectoris: Secondary | ICD-10-CM | POA: Diagnosis present

## 2018-03-29 DIAGNOSIS — I214 Non-ST elevation (NSTEMI) myocardial infarction: Secondary | ICD-10-CM | POA: Diagnosis present

## 2018-03-29 DIAGNOSIS — Z955 Presence of coronary angioplasty implant and graft: Secondary | ICD-10-CM | POA: Diagnosis not present

## 2018-03-29 DIAGNOSIS — N183 Chronic kidney disease, stage 3 (moderate): Secondary | ICD-10-CM | POA: Diagnosis present

## 2018-03-29 DIAGNOSIS — Z888 Allergy status to other drugs, medicaments and biological substances status: Secondary | ICD-10-CM | POA: Diagnosis not present

## 2018-03-29 DIAGNOSIS — Z79899 Other long term (current) drug therapy: Secondary | ICD-10-CM | POA: Diagnosis not present

## 2018-03-29 DIAGNOSIS — I249 Acute ischemic heart disease, unspecified: Secondary | ICD-10-CM | POA: Diagnosis present

## 2018-03-29 DIAGNOSIS — Z882 Allergy status to sulfonamides status: Secondary | ICD-10-CM | POA: Diagnosis not present

## 2018-03-29 DIAGNOSIS — K219 Gastro-esophageal reflux disease without esophagitis: Secondary | ICD-10-CM | POA: Diagnosis present

## 2018-03-29 LAB — CBC
HCT: 41.6 % (ref 39.0–52.0)
Hemoglobin: 13.4 g/dL (ref 13.0–17.0)
MCH: 29.7 pg (ref 26.0–34.0)
MCHC: 32.2 g/dL (ref 30.0–36.0)
MCV: 92.2 fL (ref 80.0–100.0)
Platelets: 222 10*3/uL (ref 150–400)
RBC: 4.51 MIL/uL (ref 4.22–5.81)
RDW: 12.9 % (ref 11.5–15.5)
WBC: 6.1 10*3/uL (ref 4.0–10.5)
nRBC: 0 % (ref 0.0–0.2)

## 2018-03-29 LAB — HEPARIN LEVEL (UNFRACTIONATED)
HEPARIN UNFRACTIONATED: 0.4 [IU]/mL (ref 0.30–0.70)
Heparin Unfractionated: 0.45 [IU]/mL (ref 0.30–0.70)

## 2018-03-29 LAB — BASIC METABOLIC PANEL
Anion gap: 7 (ref 5–15)
BUN: 26 mg/dL — AB (ref 8–23)
CO2: 23 mmol/L (ref 22–32)
Calcium: 8.9 mg/dL (ref 8.9–10.3)
Chloride: 111 mmol/L (ref 98–111)
Creatinine, Ser: 1.36 mg/dL — ABNORMAL HIGH (ref 0.61–1.24)
GFR calc Af Amer: 56 mL/min — ABNORMAL LOW (ref >60)
GFR calc non Af Amer: 48 mL/min — ABNORMAL LOW (ref >60)
Glucose, Bld: 102 mg/dL — ABNORMAL HIGH (ref 70–99)
Potassium: 4.1 mmol/L (ref 3.5–5.1)
Sodium: 141 mmol/L (ref 135–145)

## 2018-03-29 LAB — ECHOCARDIOGRAM COMPLETE
Height: 65 in
Weight: 2640 [oz_av]

## 2018-03-29 LAB — TROPONIN I: Troponin I: 0.86 ng/mL (ref <0.03)

## 2018-03-29 LAB — GLUCOSE, CAPILLARY: Glucose-Capillary: 96 mg/dL (ref 70–99)

## 2018-03-29 MED ORDER — ASPIRIN 81 MG PO CHEW: 81.0000 mg | CHEWABLE_TABLET | ORAL | Status: DC

## 2018-03-29 MED ORDER — MAGNESIUM 30 MG PO TABS: 30.0000 mg | ORAL_TABLET | Freq: Two times a day (BID) | ORAL | Status: DC

## 2018-03-29 MED ORDER — ACETAMINOPHEN 325 MG PO TABS: 650.0000 mg | ORAL_TABLET | Freq: Four times a day (QID) | ORAL | Status: DC | PRN

## 2018-03-29 MED ORDER — METOPROLOL TARTRATE 25 MG PO TABS
25.0000 mg | ORAL_TABLET | Freq: Two times a day (BID) | ORAL | Status: DC
  Administered 2018-03-29 – 2018-03-31 (×5): 25 mg via ORAL
  Filled 2018-03-29 (×5): qty 1

## 2018-03-29 MED ORDER — BISACODYL 5 MG PO TBEC
5.0000 mg | DELAYED_RELEASE_TABLET | Freq: Every day | ORAL | Status: DC | PRN
  Filled 2018-03-29: qty 1

## 2018-03-29 MED ORDER — CLONIDINE HCL 0.1 MG PO TABS
0.1000 mg | ORAL_TABLET | Freq: Two times a day (BID) | ORAL | Status: DC
  Administered 2018-03-29 – 2018-03-31 (×5): 0.1 mg via ORAL
  Filled 2018-03-29 (×5): qty 1

## 2018-03-29 MED ORDER — HYDROCODONE-ACETAMINOPHEN 5-325 MG PO TABS
1.0000 | ORAL_TABLET | ORAL | Status: DC | PRN
  Administered 2018-03-31 (×2): 2 via ORAL
  Filled 2018-03-29 (×2): qty 2

## 2018-03-29 MED ORDER — SODIUM CHLORIDE 0.9 % IV SOLN: 250.0000 mL | INTRAVENOUS | Status: DC | PRN

## 2018-03-29 MED ORDER — ONDANSETRON HCL 4 MG PO TABS: 4.0000 mg | ORAL_TABLET | Freq: Four times a day (QID) | ORAL | Status: DC | PRN

## 2018-03-29 MED ORDER — TRAZODONE HCL 50 MG PO TABS: 25.0000 mg | ORAL_TABLET | Freq: Every evening | ORAL | Status: DC | PRN

## 2018-03-29 MED ORDER — SODIUM CHLORIDE 0.9% FLUSH
3.0000 mL | Freq: Two times a day (BID) | INTRAVENOUS | Status: DC
  Administered 2018-03-29 – 2018-03-30 (×2): 3 mL via INTRAVENOUS

## 2018-03-29 MED ORDER — DOCUSATE SODIUM 100 MG PO CAPS
100.0000 mg | ORAL_CAPSULE | Freq: Two times a day (BID) | ORAL | Status: DC
  Administered 2018-03-29: 100 mg via ORAL
  Filled 2018-03-29 (×5): qty 1

## 2018-03-29 MED ORDER — SODIUM CHLORIDE 0.9% FLUSH: 3.0000 mL | INTRAVENOUS | Status: DC | PRN

## 2018-03-29 MED ORDER — ALLOPURINOL 100 MG PO TABS
100.0000 mg | ORAL_TABLET | Freq: Every day | ORAL | Status: DC
  Administered 2018-03-29 – 2018-03-31 (×3): 100 mg via ORAL
  Filled 2018-03-29 (×3): qty 1

## 2018-03-29 MED ORDER — SODIUM CHLORIDE 0.9 % IV SOLN
Freq: Once | INTRAVENOUS | Status: AC
  Administered 2018-03-30: 05:00:00 via INTRAVENOUS

## 2018-03-29 MED ORDER — AMLODIPINE BESYLATE 10 MG PO TABS
10.0000 mg | ORAL_TABLET | Freq: Every day | ORAL | Status: DC
  Administered 2018-03-29 – 2018-03-31 (×3): 10 mg via ORAL
  Filled 2018-03-29: qty 1
  Filled 2018-03-29: qty 2
  Filled 2018-03-29 (×2): qty 1

## 2018-03-29 MED ORDER — ISOSORBIDE MONONITRATE ER 30 MG PO TB24
30.0000 mg | ORAL_TABLET | Freq: Every day | ORAL | Status: DC
  Administered 2018-03-29 – 2018-03-30 (×2): 30 mg via ORAL
  Filled 2018-03-29 (×2): qty 1

## 2018-03-29 MED ORDER — ADULT MULTIVITAMIN W/MINERALS CH
1.0000 | ORAL_TABLET | Freq: Every day | ORAL | Status: DC
  Administered 2018-03-29 – 2018-03-31 (×3): 1 via ORAL
  Filled 2018-03-29 (×3): qty 1

## 2018-03-29 MED ORDER — ACETAMINOPHEN 650 MG RE SUPP: 650.0000 mg | Freq: Four times a day (QID) | RECTAL | Status: DC | PRN

## 2018-03-29 MED ORDER — ONDANSETRON HCL 4 MG/2ML IJ SOLN: 4.0000 mg | Freq: Four times a day (QID) | INTRAMUSCULAR | Status: DC | PRN

## 2018-03-29 NOTE — Progress Notes (Addendum)
[facility] NAME: Timothy Goodwin    MR#:  [id number]  DATE OF BIRTH:  [birth year]  SUBJECTIVE:  seen in the emergency room. Patient presented with chest pain/pressure with mild shortness of breath. Currently asymptomatic. Very independent and active. REVIEW OF SYSTEMS:   Review of Systems  Constitutional: Negative for chills, fever and weight loss.  HENT: Negative for ear discharge, ear pain and nosebleeds.   Eyes: Negative for blurred vision, pain and discharge.  Respiratory: Negative for sputum production, shortness of breath, wheezing and stridor.   Cardiovascular: Negative for chest pain, palpitations, orthopnea and PND.  Gastrointestinal: Negative for abdominal pain, diarrhea, nausea and vomiting.  Genitourinary: Negative for frequency and urgency.  Musculoskeletal: Negative for back pain and joint pain.  Neurological: Negative for sensory change, speech change, focal weakness and weakness.  Psychiatric/Behavioral: Negative for depression and hallucinations. The patient is not nervous/anxious.    Tolerating Diet:yes Tolerating PT: walks by his self  DRUG ALLERGIES:   Allergies  Allergen Reactions  . Aspirin Shortness Of Breath  . Lisinopril Shortness Of Breath  . Sulfa Antibiotics Swelling and Rash    VITALS:  Blood pressure (!) 146/68, pulse (!) 55, temperature 97.9 F (36.6 C), temperature source Oral, resp. rate 20, height 5\' 5"  (1.651 m), weight 74.3 kg, SpO2 100 %.  PHYSICAL EXAMINATION:   Physical Exam  GENERAL:  83 y.o.-year-old patient lying in the bed with no acute distress.  EYES: Pupils equal, round, reactive to light and accommodation. No scleral icterus. Extraocular muscles intact.  HEENT: Head atraumatic, normocephalic. Oropharynx and nasopharynx clear.  NECK:  Supple, no jugular venous distention. No thyroid enlargement, no tenderness.  LUNGS: Normal breath sounds bilaterally, no wheezing,  rales, rhonchi. No use of accessory muscles of respiration.  CARDIOVASCULAR: S1, S2 normal. No murmurs, rubs, or gallops.  ABDOMEN: Soft, nontender, nondistended. Bowel sounds present. No organomegaly or mass.  EXTREMITIES: No cyanosis, clubbing or edema b/l.    NEUROLOGIC: Cranial nerves II through XII are intact. No focal Motor or sensory deficits b/l.   PSYCHIATRIC:  patient is alert and oriented x 3.  SKIN: No obvious rash, lesion, or ulcer.   LABORATORY PANEL:  CBC Recent Labs  Lab 03/29/18 0856  WBC 6.1  HGB 13.4  HCT 41.6  PLT 222    Chemistries  Recent Labs  Lab 03/29/18 0856  NA 141  K 4.1  CL 111  CO2 23  GLUCOSE 102*  BUN 26*  CREATININE 1.36*  CALCIUM 8.9   Cardiac Enzymes Recent Labs  Lab 03/29/18 0856  TROPONINI 0.86*   RADIOLOGY:  Dg Chest 2 View  Result Date: 03/28/2018 CLINICAL DATA:  83 y/o M; intermittent, nonradiating, central chest pain. EXAM: CHEST - 2 VIEW COMPARISON:  12/25/2013 chest radiograph FINDINGS: Stable normal cardiac silhouette given projection and technique. Uncoiled aorta. Aortic atherosclerosis with calcification. Reticular and peripheral linear opacities of the lungs. No consolidation. No pleural effusion or pneumothorax. No acute osseous abnormality is evident. IMPRESSION: Mild interstitial edema. No consolidation. Aortic Atherosclerosis (ICD10-I70.0). Electronically Signed   By: Kristine Garbe M.D.   On: 03/28/2018 20:41   ASSESSMENT AND PLAN:  Timothy Goodwin  is a 83 y.o. male with a known history of uncontrolled hypertension, CAD and CKD. Patient presented to emergency room for intermittent chest pain going on for the past 2 weeks.The chest pain is severe, retrosternal, described as heavy pressure and without radiation.  Chest pain is worse with  exertion.  The most recent episode just before presenting to emergency room lasted 45 minutes and did not improve even with nitroglycerin.  1.Acute NSTEMI in this patient with  history of CAD.   - patient on heparin drip.   -Continue to monitor on telemetry and follow troponin levels.   -Will check 2D echo for further evaluation of cardiac function.  - Cardiology consult with Dr. Clayborn Bigness. The case discussed with him. Possibility of cardiac cath tomorrow -Continue metoprolol, imdur, amlodipine ,aspirin -troponin .10--- .22--- .86 -pt had cardiac cath in 2000 and stent placement to LAD. -Myoview stress test in 2015 which was essentially negative  2.  Hypertensive emergency.  Will restart home hypertensive medication.  And continue to monitor blood pressure closely.  3.  CKD 3.  Creatinine level is at baseline.  We will continue to monitor kidney function closely and avoid nephrotoxic medications.    Case discussed with Care Management/Social Worker. Management plans discussed with the patient, family and they are in agreement.  CODE STATUS: full DVT Prophylaxis: Iv heparin gtt  TOTAL TIME TAKING CARE OF THIS PATIENT: *30* minutes.  >50% time spent on counselling and coordination of care  POSSIBLE D/C IN *2-3DAYS, DEPENDING ON CLINICAL CONDITION.  Note: This dictation was prepared with Dragon dictation along with smaller phrase technology. Any transcriptional errors that result from this process are unintentional.  Fritzi Mandes M.D on 03/29/2018 at 4:18 PM  Between 7am to 6pm - Pager - [phone]  After 6pm go to [email or web address] - password [email or web address] [facility]  Office  [phone]  CC: Primary care physician; Tracie Harrier, MDPatient ID: Timothy Goodwin, male   DOB: 1934/07/31, 83 y.o.   MRN: [id number]

## 2018-03-29 NOTE — Progress Notes (Signed)
*  PRELIMINARY RESULTS* Echocardiogram 2D Echocardiogram has been performed.  Timothy Goodwin 03/29/2018, 11:13 AM

## 2018-03-29 NOTE — Progress Notes (Signed)
ANTICOAGULATION CONSULT NOTE - Initial Consult  Pharmacy Consult for heparin Indication: chest pain/ACS  Patient Measurements: Height: 5\' 5"  (165.1 cm) Weight: 165 lb (74.8 kg) IBW/kg (Calculated) : 61.5  Vital Signs: BP: 172/77 (12/10 1136) Pulse Rate: 69 (12/10 1136)  Labs: Recent Labs    03/28/18 2007 03/28/18 2305 03/29/18 0856  HGB 14.9  --  13.4  HCT 44.8  --  41.6  PLT 246  --  222  APTT  --  33  --   LABPROT  --  13.6  --   INR  --  1.05  --   HEPARINUNFRC  --   --  0.40  CREATININE 1.75*  --  1.36*  TROPONINI 0.10* 0.22* 0.86*    Estimated Creatinine Clearance: 40.2 mL/min (A) (by C-G formula based on SCr of 1.36 mg/dL (H)).  Medications:  Scheduled:  . allopurinol  100 mg Oral Daily  . amLODipine  10 mg Oral Daily  . cloNIDine  0.1 mg Oral BID  . docusate sodium  100 mg Oral BID  . isosorbide mononitrate  30 mg Oral Daily  . magnesium  30 mg Oral BID  . metoprolol tartrate  25 mg Oral BID  . multivitamin with minerals  1 tablet Oral Daily    Assessment: Patient arrived w/ intermittent CP, initial trop 0.10, EKG showed some ST abnormalities w/ peaked T wave. Patient not on PTA anticoagulation. Hgb down slightly, still wnl; PLT wnl and stable. Troponin is still trending up.  Heparin Course 12/09 PM: 4000 unit bolus then 1000 units/hr 12/10 0856 HL 0.40  Goal of Therapy:  Heparin level 0.3-0.7 units/ml Monitor platelets by anticoagulation protocol: Yes   Plan:  Maintain heparin rate at 1000 units/hr Will check an anti-Xa at 1600 Will monitor daily cbc's and adjust per anti-Xa.  Vallery Sa, PharmD Clinical Pharmacist 03/29/2018

## 2018-03-29 NOTE — ED Notes (Signed)
Pt's diet to remain NPO per attending Posey Pronto until evaluated by cardiology.

## 2018-03-29 NOTE — Progress Notes (Signed)
ANTICOAGULATION CONSULT NOTE - Follow up [place] for heparin Indication: chest pain/ACS  Patient Measurements: Height: 5\' 5"  (165.1 cm) Weight: 163 lb 11.2 oz (74.3 kg) IBW/kg (Calculated) : 61.5  Vital Signs: Temp: 97.9 F (36.6 C) (12/10 1509) Temp Source: Oral (12/10 1509) BP: 146/68 (12/10 1509) Pulse Rate: 55 (12/10 1509)  Labs: Recent Labs    03/28/18 2007 03/28/18 2305 03/29/18 0856 03/29/18 1541  HGB 14.9  --  13.4  --   HCT 44.8  --  41.6  --   PLT 246  --  222  --   APTT  --  33  --   --   LABPROT  --  13.6  --   --   INR  --  1.05  --   --   HEPARINUNFRC  --   --  0.40 0.45  CREATININE 1.75*  --  1.36*  --   TROPONINI 0.10* 0.22* 0.86*  --     Estimated Creatinine Clearance: 40.1 mL/min (A) (by C-G formula based on SCr of 1.36 mg/dL (H)).  Medications:  Scheduled:  . allopurinol  100 mg Oral Daily  . amLODipine  10 mg Oral Daily  . cloNIDine  0.1 mg Oral BID  . docusate sodium  100 mg Oral BID  . isosorbide mononitrate  30 mg Oral Daily  . metoprolol tartrate  25 mg Oral BID  . multivitamin with minerals  1 tablet Oral Daily    Assessment: Patient arrived w/ intermittent CP, initial trop 0.10, EKG showed some ST abnormalities w/ peaked T wave. Patient not on PTA anticoagulation. Hgb down slightly, still wnl; PLT wnl and stable. Troponin is still trending up.  Heparin Course 12/09 PM: 4000 unit bolus then 1000 units/hr 12/10 0856 HL 0.40 12/10 1541 HL 0.45  Goal of Therapy:  Heparin level 0.3-0.7 units/ml Monitor platelets by anticoagulation protocol: Yes   Plan:  Maintain heparin rate at 1000 units/hr Will check an anti-Xa in AM as there have been 2 therapeutic levels Will monitor daily cbc's and adjust per anti-Xa.  Pharmacy will continue to follow.   Rayna Sexton, PharmD, BCPS Clinical Pharmacist 03/29/2018 5:17 PM

## 2018-03-29 NOTE — H&P (Signed)
[facility] [place] NAME: Timothy Goodwin    MR#:  [id number]  DATE OF BIRTH:  [birth year]  DATE OF ADMISSION:  03/28/2018  PRIMARY CARE PHYSICIAN: Tracie Harrier, MD   REQUESTING/REFERRING PHYSICIAN:   CHIEF COMPLAINT:   Chief Complaint  Patient presents with  . Chest Pain    HISTORY OF PRESENT ILLNESS: Timothy Goodwin  is a 83 y.o. male with a known history of uncontrolled hypertension, CAD and CKD. Patient presented to emergency room for intermittent chest pain going on for the past 2 weeks. The chest pain is severe, retrosternal, described as heavy pressure and without radiation.  Chest pain is worse with exertion.  The most recent episode just before presenting to emergency room lasted 45 minutes and did not improve even with nitroglycerin.  He denies any dizziness, nausea, palpitations, bleeding.  No fever or chills. Initial blood pressure was elevated at 200/90. EKG shows normal sinus rhythm with heart rate of 74 bpm, no acute ischemic changes. Blood test done emergency room are notable for creatinine level elevated at 1.75 and troponin level elevated at 0.1 and then 0.22. Patient is admitted for further evaluation and treatment.  PAST MEDICAL HISTORY:   Past Medical History:  Diagnosis Date  . Hypertension     PAST SURGICAL HISTORY:  Past Surgical History:  Procedure Laterality Date  . heart stent placement    . PROSTATE SURGERY      SOCIAL HISTORY:  Social History   Tobacco Use  . Smoking status: Never Smoker  . Smokeless tobacco: Never Used  Substance Use Topics  . Alcohol use: Yes    Comment: occasionally    FAMILY HISTORY: No family history on file.  DRUG ALLERGIES:  Allergies  Allergen Reactions  . Aspirin Shortness Of Breath  . Lisinopril Shortness Of Breath  . Sulfa Antibiotics Swelling and Rash    REVIEW OF SYSTEMS:   CONSTITUTIONAL: No fever, fatigue or weakness.  EYES: No changes in  vision.  EARS, NOSE, AND THROAT: No tinnitus or ear pain.  RESPIRATORY: No cough, shortness of breath, wheezing or hemoptysis.  CARDIOVASCULAR: Positive for chest pain, no orthopnea, edema.  GASTROINTESTINAL: No nausea, vomiting, diarrhea or abdominal pain.  GENITOURINARY: No dysuria, hematuria.  ENDOCRINE: No polyuria, nocturia. HEMATOLOGY: No bleeding. SKIN: No rash or lesion. MUSCULOSKELETAL: No joint pain at this time.   NEUROLOGIC: No focal weakness.  PSYCHIATRY: No anxiety or depression.   MEDICATIONS AT HOME:  Prior to Admission medications   Medication Sig Start Date End Date Taking? Authorizing Provider  allopurinol (ZYLOPRIM) 100 MG tablet TAKE 1 TABLET DAILY 03/27/16  Yes [provider]  amLODipine (NORVASC) 10 MG tablet Take 10 mg by mouth daily.  02/26/16  Yes [provider]  calcium carbonate 1250 MG capsule Take 1,250 mg by mouth daily.    Yes [provider]  cloNIDine (CATAPRES) 0.1 MG tablet Take 0.1 mg by mouth 2 (two) times daily.  03/16/16 03/29/18 Yes [provider]  clopidogrel (PLAVIX) 75 MG tablet Take 75 mg by mouth daily.  03/30/16  Yes [provider]  isosorbide mononitrate (IMDUR) 30 MG 24 hr tablet  04/16/16  Yes [provider]  magnesium 30 MG tablet Take 30 mg by mouth 2 (two) times daily.   Yes [provider]  metoprolol tartrate (LOPRESSOR) 25 MG tablet Take 25 mg by mouth 2 (two) times daily.  06/01/16  Yes [provider]  Multiple Vitamin (MULTIVITAMIN)  capsule Take 1 capsule by mouth daily.   Yes [provider]  TURMERIC PO Take by mouth.   Yes [provider]  vitamin C (ASCORBIC ACID) 500 MG tablet Take by mouth.   Yes [provider]  Vitamin D, Ergocalciferol, (DRISDOL) 50000 units CAPS capsule  04/22/16  Yes [provider]  vitamin E 200 UNIT capsule Take by mouth.   Yes [provider]      PHYSICAL EXAMINATION:   VITAL  SIGNS: Blood pressure (!) 154/76, pulse (!) 54, temperature 98.8 F (37.1 C), temperature source Oral, resp. rate 15, height 5\' 5"  (1.651 m), weight 74.8 kg, SpO2 96 %.  GENERAL:  83 y.o.-year-old patient lying in the bed with no acute distress, currently improved status post nitroglycerin and heparin drip and morphine IV.  EYES: Pupils equal, round, reactive to light and accommodation. No scleral icterus. Extraocular muscles intact.  HEENT: Head atraumatic, normocephalic. Oropharynx and nasopharynx clear.  NECK:  Supple, no jugular venous distention. No thyroid enlargement, no tenderness.  LUNGS: Normal breath sounds bilaterally, no wheezing, rales,rhonchi or crepitation. No use of accessory muscles of respiration.  CARDIOVASCULAR: S1, S2 normal. No S3/S4.  ABDOMEN: Soft, nontender, nondistended. Bowel sounds present. No organomegaly or mass.  EXTREMITIES: No pedal edema, cyanosis, or clubbing.  NEUROLOGIC: Cranial nerves II through XII are intact. Muscle strength 5/5 in all extremities. Sensation intact.   PSYCHIATRIC: The patient is alert and oriented x 3.  SKIN: No obvious rash, lesion, or ulcer.   LABORATORY PANEL:   CBC Recent Labs  Lab 03/28/18 2007  WBC 5.6  HGB 14.9  HCT 44.8  PLT 246  MCV 90.3  MCH 30.0  MCHC 33.3  RDW 13.0   ------------------------------------------------------------------------------------------------------------------  Chemistries  Recent Labs  Lab 03/28/18 2007  NA 141  K 4.2  CL 108  CO2 25  GLUCOSE 156*  BUN 27*  CREATININE 1.75*  CALCIUM 9.1   ------------------------------------------------------------------------------------------------------------------ estimated creatinine clearance is 31.3 mL/min (A) (by C-G formula based on SCr of 1.75 mg/dL (H)). ------------------------------------------------------------------------------------------------------------------ No results for input(s): TSH, T4TOTAL, T3FREE, THYROIDAB in the last  72 hours.  Invalid input(s): FREET3   Coagulation profile Recent Labs  Lab 03/28/18 2305  INR 1.05   ------------------------------------------------------------------------------------------------------------------- No results for input(s): DDIMER in the last 72 hours. -------------------------------------------------------------------------------------------------------------------  Cardiac Enzymes Recent Labs  Lab 03/28/18 2007 03/28/18 2305  TROPONINI 0.10* 0.22*   ------------------------------------------------------------------------------------------------------------------ Invalid input(s): POCBNP  ---------------------------------------------------------------------------------------------------------------  Urinalysis No results found for: COLORURINE, APPEARANCEUR, LABSPEC, PHURINE, GLUCOSEU, HGBUR, BILIRUBINUR, KETONESUR, PROTEINUR, UROBILINOGEN, NITRITE, LEUKOCYTESUR   RADIOLOGY: Dg Chest 2 View  Result Date: 03/28/2018 CLINICAL DATA:  83 y/o M; intermittent, nonradiating, central chest pain. EXAM: CHEST - 2 VIEW COMPARISON:  12/25/2013 chest radiograph FINDINGS: Stable normal cardiac silhouette given projection and technique. Uncoiled aorta. Aortic atherosclerosis with calcification. Reticular and peripheral linear opacities of the lungs. No consolidation. No pleural effusion or pneumothorax. No acute osseous abnormality is evident. IMPRESSION: Mild interstitial edema. No consolidation. Aortic Atherosclerosis (ICD10-I70.0). Electronically Signed   By: Kristine Garbe M.D.   On: 03/28/2018 20:41    EKG: Orders placed or performed during the hospital encounter of 03/28/18  . ED EKG  . ED EKG    IMPRESSION AND PLAN:  1. NSTEMI in this patient with history of CAD.  We will start patient on heparin drip.  Continue to monitor on telemetry and follow troponin levels.  Will check 2D echo for further evaluation of cardiac function.  Cardiology is consulted  for further evaluation and treatment. 2.  Hypertensive emergency.  Will restart home hypertensive medication.  And continue to monitor blood pressure closely. 3.  CKD 3.  Creatinine level is at baseline.  We will continue to monitor kidney function closely and avoid nephrotoxic medications.  All the records are reviewed and case discussed with ED provider. Management plans discussed with the patient, family and they are in agreement.  CODE STATUS: Full    TOTAL TIME TAKING CARE OF THIS PATIENT: 50 minutes.    Amelia Jo M.D on 03/29/2018 at 12:42 AM  Between 7am to 6pm - Pager - [phone]  After 6pm go to [email or web address] - password [email or web address] [facility] [place] [facility]  [phone]  CC: Primary care physician; Tracie Harrier, MD

## 2018-03-29 NOTE — Consult Note (Signed)
Reason for Consult: Unstable angina elevated troponin Referring Physician: Dr. Roque Lias primary Dr. Amelia Jo hospitalist Cardiologist [facility]  Timothy Goodwin is an 83 y.o. male.  HPI: Patient is a 83 year old white male known coronary disease history of PCI and stent 2000 in [place].  Patient states that he is done reasonably well had a functional study a few years ago which was essentially unremarkable he has been treated medically with Imdur but started having midsternal chest discomfort over the last several days has gotten progressively worse recently he woke up with chest discomfort.  Patient made an attempt to have a follow-up with cardiology but with accelerating nature of his chest discomfort he came to the emergency room for evaluation.  In the emergency room he was found to have elevated troponins chest pain was improved with anticoagulation and nitrates.  Patient states it feels like his normal anginal symptoms.  Patient states he has been compliant with his medication  Past Medical History:  Diagnosis Date  . Hypertension     Past Surgical History:  Procedure Laterality Date  . heart stent placement    . PROSTATE SURGERY      History reviewed. No pertinent family history.  Social History:  reports that he has never smoked. He has never used smokeless tobacco. He reports that he drinks alcohol. He reports that he does not use drugs.  Allergies:  Allergies  Allergen Reactions  . Aspirin Shortness Of Breath  . Lisinopril Shortness Of Breath  . Sulfa Antibiotics Swelling and Rash    Medications: I have reviewed the patient's current medications.  Results for orders placed or performed during the hospital encounter of 03/28/18 (from the past 48 hour(s))  CBC     Status: None   Collection Time: 03/28/18  8:07 PM  Result Value Ref Range   WBC 5.6 4.0 - 10.5 K/uL   RBC 4.96 4.22 - 5.81 MIL/uL   Hemoglobin 14.9 13.0 - 17.0 g/dL   HCT 44.8 39.0 - 52.0 %   MCV 90.3 80.0 -  100.0 fL   MCH 30.0 26.0 - 34.0 pg   MCHC 33.3 30.0 - 36.0 g/dL   RDW 13.0 11.5 - 15.5 %   Platelets 246 150 - 400 K/uL   nRBC 0.0 0.0 - 0.2 %    Comment: Performed at [facility], [address] [place], [place] [address]  Basic metabolic panel     Status: Abnormal   Collection Time: 03/28/18  8:07 PM  Result Value Ref Range   Sodium 141 135 - 145 mmol/L   Potassium 4.2 3.5 - 5.1 mmol/L   Chloride 108 98 - 111 mmol/L   CO2 25 22 - 32 mmol/L   Glucose, Bld 156 (H) 70 - 99 mg/dL   BUN 27 (H) 8 - 23 mg/dL   Creatinine, Ser 1.75 (H) 0.61 - 1.24 mg/dL   Calcium 9.1 8.9 - 10.3 mg/dL   GFR calc non Af Amer 36 (L) >60 mL/min   GFR calc Af Amer 41 (L) >60 mL/min   Anion gap 8 5 - 15    Comment: Performed at [facility], [address] [place], [place] [address]  Troponin I - ONCE - STAT     Status: Abnormal   Collection Time: 03/28/18  8:07 PM  Result Value Ref Range   Troponin I 0.10 (HH) <0.03 ng/mL    Comment: CRITICAL RESULT CALLED TO, READ BACK BY AND VERIFIED WITH LISA THOMPSON @2035  ON 03/28/18 BY FMW  Performed at [facility], [address] [place], [place] [address]   Troponin I - Once-Timed     Status: Abnormal   Collection Time: 03/28/18 11:05 PM  Result Value Ref Range   Troponin I 0.22 (HH) <0.03 ng/mL    Comment: CRITICAL VALUE NOTED. VALUE IS CONSISTENT WITH PREVIOUSLY REPORTED/CALLED VALUE / FLC Performed at [facility], [address] [place], [place] [address]   APTT     Status: None   Collection Time: 03/28/18 11:05 PM  Result Value Ref Range   aPTT 33 24 - 36 seconds    Comment: Performed at [facility], [address] [place], [place] [address]  Protime-INR     Status: None   Collection Time: 03/28/18 11:05 PM  Result Value Ref Range   Prothrombin Time 13.6 11.4 - 15.2 seconds   INR 1.05     Comment: Performed at [facility], [address] [place], [place] [address]  Glucose,  capillary     Status: None   Collection Time: 03/29/18  8:52 AM  Result Value Ref Range   Glucose-Capillary 96 70 - 99 mg/dL  Heparin level (unfractionated)     Status: None   Collection Time: 03/29/18  8:56 AM  Result Value Ref Range   Heparin Unfractionated 0.40 0.30 - 0.70 IU/mL    Comment: (NOTE) If heparin results are below expected values, and patient dosage has  been confirmed, suggest follow up testing of antithrombin III levels. Performed at [facility], [address] [place], [place] [address]   Basic metabolic panel     Status: Abnormal   Collection Time: 03/29/18  8:56 AM  Result Value Ref Range   Sodium 141 135 - 145 mmol/L   Potassium 4.1 3.5 - 5.1 mmol/L   Chloride 111 98 - 111 mmol/L   CO2 23 22 - 32 mmol/L   Glucose, Bld 102 (H) 70 - 99 mg/dL   BUN 26 (H) 8 - 23 mg/dL   Creatinine, Ser 1.36 (H) 0.61 - 1.24 mg/dL   Calcium 8.9 8.9 - 10.3 mg/dL   GFR calc non Af Amer 48 (L) >60 mL/min   GFR calc Af Amer 56 (L) >60 mL/min   Anion gap 7 5 - 15    Comment: Performed at [facility], [address] [place], [place] [address]  CBC     Status: None   Collection Time: 03/29/18  8:56 AM  Result Value Ref Range   WBC 6.1 4.0 - 10.5 K/uL   RBC 4.51 4.22 - 5.81 MIL/uL   Hemoglobin 13.4 13.0 - 17.0 g/dL   HCT 41.6 39.0 - 52.0 %   MCV 92.2 80.0 - 100.0 fL   MCH 29.7 26.0 - 34.0 pg   MCHC 32.2 30.0 - 36.0 g/dL   RDW 12.9 11.5 - 15.5 %   Platelets 222 150 - 400 K/uL   nRBC 0.0 0.0 - 0.2 %    Comment: Performed at [facility], [address] [place], [place] [address]  Troponin I - Add-On to previous collection     Status: Abnormal   Collection Time: 03/29/18  8:56 AM  Result Value Ref Range   Troponin I 0.86 (HH) <0.03 ng/mL    Comment: CRITICAL VALUE NOTED. VALUE IS CONSISTENT WITH PREVIOUSLY REPORTED/CALLED VALUE FMW Performed at [facility], [address] [place], [place] [address]   Heparin level (unfractionated)      Status: None  Collection Time: 03/29/18  3:41 PM  Result Value Ref Range   Heparin Unfractionated 0.45 0.30 - 0.70 IU/mL    Comment: (NOTE) If heparin results are below expected values, and patient dosage has  been confirmed, suggest follow up testing of antithrombin III levels. Performed at [facility], [address] [place], [place] [address]     Dg Chest 2 View  Result Date: 03/28/2018 CLINICAL DATA:  83 y/o M; intermittent, nonradiating, central chest pain. EXAM: CHEST - 2 VIEW COMPARISON:  12/25/2013 chest radiograph FINDINGS: Stable normal cardiac silhouette given projection and technique. Uncoiled aorta. Aortic atherosclerosis with calcification. Reticular and peripheral linear opacities of the lungs. No consolidation. No pleural effusion or pneumothorax. No acute osseous abnormality is evident. IMPRESSION: Mild interstitial edema. No consolidation. Aortic Atherosclerosis (ICD10-I70.0). Electronically Signed   By: Kristine Garbe M.D.   On: 03/28/2018 20:41    Review of Systems  Constitutional: Positive for diaphoresis and malaise/fatigue.  HENT: Positive for congestion.   Eyes: Negative.   Respiratory: Positive for shortness of breath.   Cardiovascular: Positive for chest pain and orthopnea.  Gastrointestinal: Negative.   Genitourinary: Negative.   Musculoskeletal: Positive for back pain.  Skin: Negative.   Neurological: Positive for dizziness and weakness.  Endo/Heme/Allergies: Negative.   Psychiatric/Behavioral: Negative.    Blood pressure (!) 146/68, pulse (!) 55, temperature 97.9 F (36.6 C), temperature source Oral, resp. rate 20, height 5\' 5"  (1.651 m), weight 74.3 kg, SpO2 100 %. Physical Exam  Nursing note and vitals reviewed. Constitutional: He is oriented to person, place, and time. He appears well-developed and well-nourished.  HENT:  Head: Normocephalic and atraumatic.  Eyes: Pupils are equal, round, and reactive to light.  Conjunctivae and EOM are normal.  Neck: Normal range of motion. Neck supple.  Cardiovascular: Normal rate and regular rhythm.  Murmur heard. Respiratory: Effort normal and breath sounds normal.  GI: Soft. Bowel sounds are normal.  Musculoskeletal: Normal range of motion.  Neurological: He is alert and oriented to person, place, and time. He has normal reflexes.  Skin: Skin is warm and dry.  Psychiatric: He has a normal mood and affect.    Assessment/Plan: Unstable angina Coronary artery disease History of PCI and stent Hyperlipidemia Hypertension Elevated troponin Chronic renal sufficiency stage III . Plan Agree with admission Rule out myocardial infarction follow-up EKGs and troponins Recommend intravenous heparin therapy for anticoagulation Echocardiogram for further assessment evaluation Advised patient to follow-up with nephrology for chronic renal sufficiency Recommend cardiac cath prior to discharge we will hydrate to protect his kidneys  Timothy Goodwin 03/29/2018, 6:41 PM

## 2018-03-30 ENCOUNTER — Encounter
Admission: EM | Disposition: A | Discharge status: Other Acute Inpatient Hospital | Payer: Self-pay | Source: Home / Self Care | Attending: Internal Medicine

## 2018-03-30 HISTORY — PX: LEFT HEART CATH AND CORONARY ANGIOGRAPHY: CATH118249

## 2018-03-30 LAB — CBC
HCT: 41.6 % (ref 39.0–52.0)
Hemoglobin: 13.6 g/dL (ref 13.0–17.0)
MCH: 29.8 pg (ref 26.0–34.0)
MCHC: 32.7 g/dL (ref 30.0–36.0)
MCV: 91 fL (ref 80.0–100.0)
NRBC: 0 % (ref 0.0–0.2)
Platelets: 222 10*3/uL (ref 150–400)
RBC: 4.57 MIL/uL (ref 4.22–5.81)
RDW: 13 % (ref 11.5–15.5)
WBC: 5.9 10*3/uL (ref 4.0–10.5)

## 2018-03-30 LAB — GLUCOSE, CAPILLARY: Glucose-Capillary: 84 mg/dL (ref 70–99)

## 2018-03-30 LAB — HEPARIN LEVEL (UNFRACTIONATED): Heparin Unfractionated: 0.48 IU/mL (ref 0.30–0.70)

## 2018-03-30 SURGERY — LEFT HEART CATH AND CORONARY ANGIOGRAPHY
Anesthesia: Moderate Sedation

## 2018-03-30 MED ORDER — LABETALOL HCL 5 MG/ML IV SOLN
INTRAVENOUS | Status: AC
  Administered 2018-03-30: 10 mg via INTRAVENOUS
  Filled 2018-03-30: qty 4

## 2018-03-30 MED ORDER — NITROGLYCERIN 2 % TD OINT
TOPICAL_OINTMENT | TRANSDERMAL | Status: AC
  Administered 2018-03-30: 1 [in_us] via TOPICAL
  Filled 2018-03-30: qty 1

## 2018-03-30 MED ORDER — MIDAZOLAM HCL 2 MG/2ML IJ SOLN
INTRAMUSCULAR | Status: DC | PRN
  Administered 2018-03-30: 1 mg via INTRAVENOUS

## 2018-03-30 MED ORDER — HEPARIN (PORCINE) IN NACL 1000-0.9 UT/500ML-% IV SOLN
INTRAVENOUS | Status: AC
  Filled 2018-03-30: qty 1000

## 2018-03-30 MED ORDER — MIDAZOLAM HCL 2 MG/2ML IJ SOLN
INTRAMUSCULAR | Status: AC
  Filled 2018-03-30: qty 2

## 2018-03-30 MED ORDER — LABETALOL HCL 5 MG/ML IV SOLN
10.0000 mg | Freq: Once | INTRAVENOUS | Status: AC
  Administered 2018-03-30: 10 mg via INTRAVENOUS

## 2018-03-30 MED ORDER — NITROGLYCERIN 0.4 MG SL SUBL
0.4000 mg | SUBLINGUAL_TABLET | SUBLINGUAL | Status: DC | PRN
  Administered 2018-03-30 – 2018-03-31 (×4): 0.4 mg via SUBLINGUAL
  Filled 2018-03-30: qty 1

## 2018-03-30 MED ORDER — HEPARIN SODIUM (PORCINE) 1000 UNIT/ML IJ SOLN
INTRAMUSCULAR | Status: AC
  Filled 2018-03-30: qty 1

## 2018-03-30 MED ORDER — SODIUM CHLORIDE 0.9% FLUSH
3.0000 mL | Freq: Two times a day (BID) | INTRAVENOUS | Status: DC
  Administered 2018-03-30 – 2018-03-31 (×2): 3 mL via INTRAVENOUS

## 2018-03-30 MED ORDER — VERAPAMIL HCL 2.5 MG/ML IV SOLN
INTRAVENOUS | Status: DC | PRN
  Administered 2018-03-30: 2.5 mg via INTRA_ARTERIAL

## 2018-03-30 MED ORDER — IOPAMIDOL (ISOVUE-300) INJECTION 61%
INTRAVENOUS | Status: DC | PRN
  Administered 2018-03-30: 155 mL via INTRA_ARTERIAL

## 2018-03-30 MED ORDER — FENTANYL CITRATE (PF) 100 MCG/2ML IJ SOLN
INTRAMUSCULAR | Status: AC
  Filled 2018-03-30: qty 2

## 2018-03-30 MED ORDER — NITROGLYCERIN 2 % TD OINT
1.0000 [in_us] | TOPICAL_OINTMENT | Freq: Once | TRANSDERMAL | Status: AC
  Administered 2018-03-30: 1 [in_us] via TOPICAL

## 2018-03-30 MED ORDER — NITROGLYCERIN 0.4 MG SL SUBL
SUBLINGUAL_TABLET | SUBLINGUAL | Status: AC
  Administered 2018-03-30: 0.4 mg via SUBLINGUAL
  Filled 2018-03-30: qty 1

## 2018-03-30 MED ORDER — ISOSORBIDE MONONITRATE ER 60 MG PO TB24
60.0000 mg | ORAL_TABLET | Freq: Every day | ORAL | Status: DC
  Administered 2018-03-31: 60 mg via ORAL
  Filled 2018-03-30: qty 1

## 2018-03-30 MED ORDER — SODIUM CHLORIDE 0.9 % WEIGHT BASED INFUSION: 1.0000 mL/kg/h | INTRAVENOUS | Status: DC

## 2018-03-30 MED ORDER — VERAPAMIL HCL 2.5 MG/ML IV SOLN
INTRAVENOUS | Status: AC
  Filled 2018-03-30: qty 2

## 2018-03-30 MED ORDER — FENTANYL CITRATE (PF) 100 MCG/2ML IJ SOLN
INTRAMUSCULAR | Status: DC | PRN
  Administered 2018-03-30: 25 ug via INTRAVENOUS

## 2018-03-30 SURGICAL SUPPLY — 16 items
CATH INFINITI 5 FR JL3.5 (CATHETERS) ×3 IMPLANT
CATH INFINITI 5FR ANG PIGTAIL (CATHETERS) ×3 IMPLANT
CATH INFINITI 5FR JL4 (CATHETERS) ×3 IMPLANT
CATH INFINITI 5FR JL5 (CATHETERS) ×3 IMPLANT
CATH INFINITI JR4 5F (CATHETERS) ×3 IMPLANT
DEVICE CLOSURE MYNXGRIP 5F (Vascular Products) ×3 IMPLANT
DEVICE RAD TR BAND REGULAR (VASCULAR PRODUCTS) ×3 IMPLANT
GLIDESHEATH SLEND SS 6F .021 (SHEATH) ×3 IMPLANT
KIT MANI 3VAL PERCEP (MISCELLANEOUS) ×3 IMPLANT
NEEDLE PERC 18GX7CM (NEEDLE) ×3 IMPLANT
PACK CARDIAC CATH (CUSTOM PROCEDURE TRAY) ×3 IMPLANT
SHEATH AVANTI 5FR X 11CM (SHEATH) ×3 IMPLANT
SHEATH AVANTI 6FR X 11CM (SHEATH) ×3 IMPLANT
WIRE GUIDERIGHT .035X150 (WIRE) ×3 IMPLANT
WIRE HITORQ VERSACORE ST 145CM (WIRE) ×3 IMPLANT
WIRE ROSEN-J .035X260CM (WIRE) ×3 IMPLANT

## 2018-03-30 NOTE — Progress Notes (Signed)
[facility] [place] NAME: Timothy Goodwin    MR#:  [id number]  DATE OF BIRTH:  March 13, 1935  SUBJECTIVE:   Patient presented with chest pain/pressure with mild shortness of breath. Currently asymptomatic. Very independent and active. REVIEW OF SYSTEMS:   Review of Systems  Constitutional: Negative for chills, fever and weight loss.  HENT: Negative for ear discharge, ear pain and nosebleeds.   Eyes: Negative for blurred vision, pain and discharge.  Respiratory: Negative for sputum production, shortness of breath, wheezing and stridor.   Cardiovascular: Negative for chest pain, palpitations, orthopnea and PND.  Gastrointestinal: Negative for abdominal pain, diarrhea, nausea and vomiting.  Genitourinary: Negative for frequency and urgency.  Musculoskeletal: Negative for back pain and joint pain.  Neurological: Negative for sensory change, speech change, focal weakness and weakness.  Psychiatric/Behavioral: Negative for depression and hallucinations. The patient is not nervous/anxious.    Tolerating Diet:yes Tolerating PT: walks by his self  DRUG ALLERGIES:   Allergies  Allergen Reactions  . Aspirin Shortness Of Breath  . Lisinopril Shortness Of Breath  . Sulfa Antibiotics Swelling and Rash    VITALS:  Blood pressure (!) 160/74, pulse 65, temperature 98.3 F (36.8 C), temperature source Oral, resp. rate 18, height 5\' 5"  (1.651 m), weight 73.9 kg, SpO2 95 %.  PHYSICAL EXAMINATION:   Physical Exam  GENERAL:  83 y.o.-year-old patient lying in the bed with no acute distress.  EYES: Pupils equal, round, reactive to light and accommodation. No scleral icterus. Extraocular muscles intact.  HEENT: Head atraumatic, normocephalic. Oropharynx and nasopharynx clear.  NECK:  Supple, no jugular venous distention. No thyroid enlargement, no tenderness.  LUNGS: Normal breath sounds bilaterally, no wheezing, rales, rhonchi. No use of accessory  muscles of respiration.  CARDIOVASCULAR: S1, S2 normal. No murmurs, rubs, or gallops.  ABDOMEN: Soft, nontender, nondistended. Bowel sounds present. No organomegaly or mass.  EXTREMITIES: No cyanosis, clubbing or edema b/l.    NEUROLOGIC: Cranial nerves II through XII are intact. No focal Motor or sensory deficits b/l.   PSYCHIATRIC:  patient is alert and oriented x 3.  SKIN: No obvious rash, lesion, or ulcer.   LABORATORY PANEL:  CBC Recent Labs  Lab 03/30/18 0513  WBC 5.9  HGB 13.6  HCT 41.6  PLT 222    Chemistries  Recent Labs  Lab 03/29/18 0856  NA 141  K 4.1  CL 111  CO2 23  GLUCOSE 102*  BUN 26*  CREATININE 1.36*  CALCIUM 8.9   Cardiac Enzymes Recent Labs  Lab 03/29/18 0856  TROPONINI 0.86*   RADIOLOGY:  Dg Chest 2 View  Result Date: 03/28/2018 CLINICAL DATA:  83 y/o M; intermittent, nonradiating, central chest pain. EXAM: CHEST - 2 VIEW COMPARISON:  12/25/2013 chest radiograph FINDINGS: Stable normal cardiac silhouette given projection and technique. Uncoiled aorta. Aortic atherosclerosis with calcification. Reticular and peripheral linear opacities of the lungs. No consolidation. No pleural effusion or pneumothorax. No acute osseous abnormality is evident. IMPRESSION: Mild interstitial edema. No consolidation. Aortic Atherosclerosis (ICD10-I70.0). Electronically Signed   By: Kristine Garbe M.D.   On: 03/28/2018 20:41   ASSESSMENT AND PLAN:  Timothy Goodwin  is a 83 y.o. male with a known history of uncontrolled hypertension, CAD and CKD. Patient presented to emergency room for intermittent chest pain going on for the past 2 weeks.The chest pain is severe, retrosternal, described as heavy pressure and without radiation.  Chest pain is worse with exertion.  The most recent  episode just before presenting to emergency room lasted 45 minutes and did not improve even with nitroglycerin.  1.Acute NSTEMI in this patient with history of CAD.   - patient on  heparin drip.   -Continue to monitor on telemetry and follow troponin levels.   -Will check 2D echo for further evaluation of cardiac function.  - Cardiology consult with Dr. Clayborn Bigness. The case discussed with him. cardiac cath today -Continue metoprolol, imdur, amlodipine ,aspirin -troponin .10--- .22--- .86 -pt had cardiac cath in 2000 and stent placement to LAD. -Myoview stress test in 2015 which was essentially negative  2.  Hypertensive emergency.  Will restart home hypertensive medication.  And continue to monitor blood pressure closely.  3.  CKD 3.  Creatinine level is at baseline.  We will continue to monitor kidney function closely and avoid nephrotoxic medications.  No family in the room   Case discussed with Care Management/Social Worker. Management plans discussed with the patient, family and they are in agreement.  CODE STATUS: full DVT Prophylaxis: Iv heparin gtt  TOTAL TIME TAKING CARE OF THIS PATIENT: *30* minutes.  >50% time spent on counselling and coordination of care  POSSIBLE D/C IN 1-2 DAYS, DEPENDING ON CLINICAL CONDITION.  Note: This dictation was prepared with Dragon dictation along with smaller phrase technology. Any transcriptional errors that result from this process are unintentional.  Fritzi Mandes M.D on 03/30/2018 at 8:21 AM  Between 7am to 6pm - Pager - [phone]  After 6pm go to [email or web address] - password [email or web address] [facility]  Office  [phone]  CC: Primary care physician; Tracie Harrier, MDPatient ID: Timothy Goodwin, male   DOB: 1935-04-01, 83 y.o.   MRN: [id number]

## 2018-03-30 NOTE — Progress Notes (Signed)
Patient ID: Timothy Goodwin, male   DOB: July 20, 1934, 83 y.o.   MRN: [id number] Per Dr Clayborn Bigness pt will get CABG evaluation at [facility] as ou pt.  Discharge tomorrow on current cardiac meds.   NO ASA (allergy) HOLD PLAVIX for possible CABG as out pt

## 2018-03-30 NOTE — Progress Notes (Signed)
Pt developed 1/10 chest discomfort while [place] at bedside.  Medications administered with resolution of symptoms, see MAR.  Will not restart heparin at this time per MD.

## 2018-03-30 NOTE — Plan of Care (Signed)
  Problem: Activity: Goal: Risk for activity intolerance will decrease Outcome: Progressing Note:  Up in room independently   Problem: Coping: Goal: Level of anxiety will decrease Outcome: Progressing   Problem: Elimination: Goal: Will not experience complications related to urinary retention Outcome: Progressing   Problem: Pain Managment: Goal: General experience of comfort will improve Outcome: Progressing Note:  No complaints of pain this shift   Problem: Safety: Goal: Ability to remain free from injury will improve Outcome: Progressing   Problem: Skin Integrity: Goal: Risk for impaired skin integrity will decrease Outcome: Progressing   Problem: Cardiac: Goal: Ability to achieve and maintain adequate cardiovascular perfusion will improve Outcome: Progressing Note:  Heparin gtt infusing, positive troponins, cardiac cath scheduled for today   Problem: Education: Goal: Knowledge of General Education information will improve Description Including pain rating scale, medication(s)/side effects and non-pharmacologic comfort measures Outcome: Completed/Met   Problem: Nutrition: Goal: Adequate nutrition will be maintained Outcome: Completed/Met

## 2018-03-30 NOTE — Progress Notes (Signed)
ANTICOAGULATION CONSULT NOTE - Follow up [place] for heparin Indication: chest pain/ACS  Patient Measurements: Height: 5\' 5"  (165.1 cm) Weight: 163 lb (73.9 kg) IBW/kg (Calculated) : 61.5  Vital Signs: Temp: 98.3 F (36.8 C) (12/11 0447) Temp Source: Oral (12/11 0447) BP: 160/74 (12/11 0447) Pulse Rate: 65 (12/11 0447)  Labs: Recent Labs    03/28/18 2007 03/28/18 2305 03/29/18 0856 03/29/18 1541 03/30/18 0513  HGB 14.9  --  13.4  --  13.6  HCT 44.8  --  41.6  --  41.6  PLT 246  --  222  --  222  APTT  --  33  --   --   --   LABPROT  --  13.6  --   --   --   INR  --  1.05  --   --   --   HEPARINUNFRC  --   --  0.40 0.45 0.48  CREATININE 1.75*  --  1.36*  --   --   TROPONINI 0.10* 0.22* 0.86*  --   --     Estimated Creatinine Clearance: 40.1 mL/min (A) (by C-G formula based on SCr of 1.36 mg/dL (H)).  Medications:  Scheduled:  . allopurinol  100 mg Oral Daily  . amLODipine  10 mg Oral Daily  . aspirin  81 mg Oral Pre-Cath  . cloNIDine  0.1 mg Oral BID  . docusate sodium  100 mg Oral BID  . isosorbide mononitrate  30 mg Oral Daily  . metoprolol tartrate  25 mg Oral BID  . multivitamin with minerals  1 tablet Oral Daily  . sodium chloride flush  3 mL Intravenous Q12H    Assessment: Patient arrived w/ intermittent CP, initial trop 0.10, EKG showed some ST abnormalities w/ peaked T wave. Patient not on PTA anticoagulation. Hgb down slightly, still wnl; PLT wnl and stable. Troponin is still trending up.  Heparin Course 12/09 PM: 4000 unit bolus then 1000 units/hr 12/10 0856 HL 0.40 12/10 1541 HL 0.45  Goal of Therapy:  Heparin level 0.3-0.7 units/ml Monitor platelets by anticoagulation protocol: Yes   Plan:  Maintain heparin rate at 1000 units/hr Will check an anti-Xa in AM as there have been 2 therapeutic levels Will monitor daily cbc's and adjust per anti-Xa.  12/11 :  HL @ 0500 = 0.48 Will continue pt on current rate and recheck HL on  12/12 with AM labs.   Pharmacy will continue to follow.   Soua Lenk D Clinical Pharmacist 03/30/2018 6:59 AM

## 2018-03-30 NOTE — Plan of Care (Signed)
Patient has no complaints of chest pain. Radial site is within normal limits.

## 2018-03-31 ENCOUNTER — Ambulatory Visit (HOSPITAL_COMMUNITY)
Admission: AD | Admit: 2018-03-31 | Discharge: 2018-03-31 | Disposition: A | Discharge status: Home / Self Care | Payer: Medicare PPO | Source: Other Acute Inpatient Hospital | Attending: Internal Medicine | Admitting: Internal Medicine

## 2018-03-31 ENCOUNTER — Encounter: Payer: Self-pay | Admitting: Internal Medicine

## 2018-03-31 ENCOUNTER — Other Ambulatory Visit: Payer: Self-pay

## 2018-03-31 DIAGNOSIS — I249 Acute ischemic heart disease, unspecified: Secondary | ICD-10-CM | POA: Insufficient documentation

## 2018-03-31 DIAGNOSIS — I2510 Atherosclerotic heart disease of native coronary artery without angina pectoris: Secondary | ICD-10-CM | POA: Insufficient documentation

## 2018-03-31 LAB — GLUCOSE, CAPILLARY: Glucose-Capillary: 181 mg/dL — ABNORMAL HIGH (ref 70–99)

## 2018-03-31 LAB — HEPARIN LEVEL (UNFRACTIONATED)

## 2018-03-31 MED ORDER — NITROGLYCERIN 2 % TD OINT: 1.0000 [in_us] | TOPICAL_OINTMENT | Freq: Four times a day (QID) | TRANSDERMAL | Status: AC

## 2018-03-31 MED ORDER — ISOSORBIDE MONONITRATE ER 60 MG PO TB24: 60.0000 mg | ORAL_TABLET | Freq: Every day | ORAL | 0 refills | Status: DC

## 2018-03-31 MED ORDER — MORPHINE SULFATE (PF) 2 MG/ML IV SOLN: 2.0000 mg | Freq: Once | INTRAVENOUS | Status: DC

## 2018-03-31 MED ORDER — LABETALOL HCL 5 MG/ML IV SOLN
5.0000 mg | Freq: Once | INTRAVENOUS | Status: AC
  Administered 2018-03-31: 5 mg via INTRAVENOUS

## 2018-03-31 MED ORDER — LABETALOL HCL 5 MG/ML IV SOLN
INTRAVENOUS | Status: AC
  Filled 2018-03-31: qty 4

## 2018-03-31 MED ORDER — HEPARIN (PORCINE) 25000 UT/250ML-% IV SOLN: 1000.0000 [IU]/h | INTRAVENOUS | 0 refills | Status: DC

## 2018-03-31 MED ORDER — LABETALOL HCL 5 MG/ML IV SOLN: 5.0000 mg | Freq: Once | INTRAVENOUS | Status: DC

## 2018-03-31 MED ORDER — HEPARIN (PORCINE) 25000 UT/250ML-% IV SOLN
1000.0000 [IU]/h | INTRAVENOUS | Status: DC
  Administered 2018-03-31: 1000 [IU]/h via INTRAVENOUS
  Filled 2018-03-31: qty 250

## 2018-03-31 MED ORDER — LABETALOL HCL 5 MG/ML IV SOLN
10.0000 mg | Freq: Once | INTRAVENOUS | Status: AC
  Administered 2018-03-31: 10 mg via INTRAVENOUS
  Filled 2018-03-31: qty 4

## 2018-03-31 MED ORDER — LABETALOL HCL 5 MG/ML IV SOLN: 5.0000 mg | Freq: Once | INTRAVENOUS | Status: AC

## 2018-03-31 MED ORDER — NITROGLYCERIN 2 % TD OINT
TOPICAL_OINTMENT | TRANSDERMAL | Status: AC
  Administered 2018-03-31: 03:00:00
  Filled 2018-03-31: qty 1

## 2018-03-31 MED ORDER — MORPHINE SULFATE (PF) 2 MG/ML IV SOLN
INTRAVENOUS | Status: AC
  Administered 2018-03-31: 2 mg via INTRAVENOUS
  Filled 2018-03-31: qty 1

## 2018-03-31 MED ORDER — LABETALOL HCL 5 MG/ML IV SOLN
INTRAVENOUS | Status: AC
  Administered 2018-03-31: 03:00:00
  Filled 2018-03-31: qty 4

## 2018-03-31 MED ORDER — MORPHINE SULFATE (PF) 2 MG/ML IV SOLN
2.0000 mg | INTRAVENOUS | Status: DC | PRN
  Administered 2018-03-31: 2 mg via INTRAVENOUS
  Filled 2018-03-31: qty 1

## 2018-03-31 NOTE — Progress Notes (Signed)
Per [facility] has a bed available bed 7112, Report called to [employer], [facility] called for transportation, Tele box removed and returned, wife and daughter updated. Transfer packet sent with [facility].Pt currently on Heparin gtt at 10 ml/hr. Pt's bp  178/99, HR 95, Dr. Vianne Bulls notified and received verbal order for one time dose of IV labetalol. Will administer and passed on to [place], [facility] administrator. Update:  BP 156/87, pulse 95.

## 2018-03-31 NOTE — Progress Notes (Signed)
ANTICOAGULATION CONSULT NOTE - Follow up [place] for heparin Indication: chest pain/ACS  Patient Measurements: Height: 5\' 5"  (165.1 cm) Weight: 161 lb 6 oz (73.2 kg) IBW/kg (Calculated) : 61.5  Vital Signs: Temp: 98.6 F (37 C) (12/12 0746) Temp Source: Oral (12/12 0746) BP: 135/77 (12/12 0746) Pulse Rate: 65 (12/12 0746)  Labs: Recent Labs    03/28/18 2007 03/28/18 2305  03/29/18 0856 03/29/18 1541 03/30/18 0513 03/31/18 0332  HGB 14.9  --   --  13.4  --  13.6  --   HCT 44.8  --   --  41.6  --  41.6  --   PLT 246  --   --  222  --  222  --   APTT  --  33  --   --   --   --   --   LABPROT  --  13.6  --   --   --   --   --   INR  --  1.05  --   --   --   --   --   HEPARINUNFRC  --   --    < > 0.40 0.45 0.48 <0.10*  CREATININE 1.75*  --   --  1.36*  --   --   --   TROPONINI 0.10* 0.22*  --  0.86*  --   --   --    < > = values in this interval not displayed.    Estimated Creatinine Clearance: 37.1 mL/min (A) (by C-G formula based on SCr of 1.36 mg/dL (H)).  Medications:  Scheduled:  . allopurinol  100 mg Oral Daily  . amLODipine  10 mg Oral Daily  . cloNIDine  0.1 mg Oral BID  . docusate sodium  100 mg Oral BID  . isosorbide mononitrate  60 mg Oral Daily  . labetalol  5 mg Intravenous Once  . metoprolol tartrate  25 mg Oral BID  .  morphine injection  2 mg Intravenous Once  . multivitamin with minerals  1 tablet Oral Daily  . sodium chloride flush  3 mL Intravenous Q12H    Assessment: Patient arrived w/ intermittent CP, initial trop 0.10, EKG showed some ST abnormalities w/ peaked T wave. Patient not on PTA anticoagulation. Hgb down slightly, still wnl; PLT wnl and stable. Last troponin 0.86.  Heparin Course 12/09 PM: 4000 unit bolus (bolus never administered) then 1000 units/hr  12/10 0856 HL 0.40 12/10 1541 HL 0.45 12/11 0500 HL 0.48 12/12 0500 HL <0.10 but drip was stopped 12/11 1900  Goal of Therapy:  Heparin level 0.3-0.7  units/ml Monitor platelets by anticoagulation protocol: Yes   Plan:  Based on previous response will re-initiate heparin infusion at 1000 units/hr with no bolus Will check an anti-Xa in 8 hours Will monitor daily cbc's and adjust per anti-Xa.  Pharmacy will continue to follow.   Dallie Piles, PharmD Clinical Pharmacist 03/31/2018 9:33 AM

## 2018-03-31 NOTE — Progress Notes (Signed)
At 0100, Patient begin complaining of 5/10. Blood pressure was elevated (noted in vital sign flowsheet). Administered 3 sublingual nitro's every 5 minutes. Patient's blood pressure decreased to 159/88. At [place], patient is complaining of 7/10 chest pain. Notified Dr.Willis, received orders for nitro paste and morphine. Obtained EKG. Blood pressure remains elevated. At 0245, Patient is complaining of chest pain. Dr.Diamond give orders for labetalol. I also administered 2 oral norcos. Patient continued to complain of chest pain 7/10. Dr.Diamond was summoned to assess patient. New orders were placed to address blood pressure and chest pain.

## 2018-03-31 NOTE — Progress Notes (Signed)
Pt continue to have chest pain 4/10, VSS, Norco given, NRS on tele. Per Dr. Clayborn Bigness will plan to transfer to [facility] today, and received verbal orders to restart heparin gtt. Pharmacy aware. Will continue to monitor.

## 2018-03-31 NOTE — Progress Notes (Signed)
ANTICOAGULATION CONSULT NOTE - Follow up Timothy Goodwin for heparin Indication: chest pain/ACS  Patient Measurements: Height: 5\' 5"  (165.1 cm) Weight: 161 lb 6 oz (73.2 kg) IBW/kg (Calculated) : 61.5  Vital Signs: Temp: 98.8 F (37.1 C) (12/12 0351) Temp Source: Oral (12/12 0351) BP: 169/85 (12/12 0351) Pulse Rate: 84 (12/12 0351)  Labs: Recent Labs    03/28/18 2007 03/28/18 2305  03/29/18 0856 03/29/18 1541 03/30/18 0513 03/31/18 0332  HGB 14.9  --   --  13.4  --  13.6  --   HCT 44.8  --   --  41.6  --  41.6  --   PLT 246  --   --  222  --  222  --   APTT  --  33  --   --   --   --   --   LABPROT  --  13.6  --   --   --   --   --   INR  --  1.05  --   --   --   --   --   HEPARINUNFRC  --   --    < > 0.40 0.45 0.48 <0.10*  CREATININE 1.75*  --   --  1.36*  --   --   --   TROPONINI 0.10* 0.22*  --  0.86*  --   --   --    < > = values in this interval not displayed.    Estimated Creatinine Clearance: 37.1 mL/min (A) (by C-G formula based on SCr of 1.36 mg/dL (H)).  Medications:  Scheduled:  . allopurinol  100 mg Oral Daily  . amLODipine  10 mg Oral Daily  . cloNIDine  0.1 mg Oral BID  . docusate sodium  100 mg Oral BID  . isosorbide mononitrate  60 mg Oral Daily  . labetalol  5 mg Intravenous Once  . metoprolol tartrate  25 mg Oral BID  .  morphine injection  2 mg Intravenous Once  . multivitamin with minerals  1 tablet Oral Daily  . nitroGLYCERIN  1 inch Topical Q6H  . sodium chloride flush  3 mL Intravenous Q12H    Assessment: Patient arrived w/ intermittent CP, initial trop 0.10, EKG showed some ST abnormalities w/ peaked T wave. Patient not on PTA anticoagulation. Hgb down slightly, still wnl; PLT wnl and stable. Troponin is still trending up.  Heparin Course 12/09 PM: 4000 unit bolus then 1000 units/hr 12/10 0856 HL 0.40 12/10 1541 HL 0.45  Goal of Therapy:  Heparin level 0.3-0.7 units/ml Monitor platelets by anticoagulation protocol:  Yes   Plan:  Maintain heparin rate at 1000 units/hr Will check an anti-Xa in AM as there have been 2 therapeutic levels Will monitor daily cbc's and adjust per anti-Xa.  12/11 :  HL @ 0500 = 0.48 Will continue pt on current rate and recheck HL on 12/12 with AM labs.   12/12: HL @ 0500 = < 0.1  Heparin gtt was Goodwin/c'Goodwin on 12/11 @ ~ 19:00 by Dr Duane Boston  Pharmacy will continue to follow.   Timothy Goodwin Clinical Pharmacist 03/31/2018 5:31 AM

## 2018-03-31 NOTE — Discharge Summary (Addendum)
Timothy Goodwin, is a 83 y.o. male  DOB June 21, 1934  MRN [id number].  Admission date:  03/28/2018  Admitting Physician  Amelia Jo, MD  Discharge Date:  03/31/2018   Primary MD  Tracie Harrier, MD  Recommendations for primary care physician for things to follow:   Patient will go to [facility] for CABG.   Admission Diagnosis  NSTEMI (non-ST elevated myocardial infarction) [facility]) [I21.4]   Discharge Diagnosis  NSTEMI (non-ST elevated myocardial infarction) ([place]) [I21.4]    Active Problems:   NSTEMI (non-ST elevated myocardial infarction) [facility])      Past Medical History:  Diagnosis Date  . Hypertension     Past Surgical History:  Procedure Laterality Date  . heart stent placement    . LEFT HEART CATH AND CORONARY ANGIOGRAPHY N/A 03/30/2018   Procedure: LEFT HEART CATH AND CORONARY ANGIOGRAPHY and possible PCI and stent;  Surgeon: Yolonda Kida, MD;  Location: [place] [facility];  Service: Cardiovascular;  Laterality: N/A;  . PROSTATE SURGERY         History of present illness and  Hospital Course:     Kindly see H&P for history of present illness and admission details, please review complete Labs, Consult reports and Test reports for all details in brief  HPI  from the history and physical done on the day of admission 83 year old male patient with history of uncontrolled hypertension, CAD, CKD comes in because of chest pain that is going on for 2 weeks before he came to the hospital.,  Chest pain was very severe retrosternal chest pain and noted to have severely elevated blood pressure up to 200/90.   Hospital Course  #1. non-ST elevation MI causing retrosternal chest pain: Patient has a history of CAD, admitted to telemetry, started on heparin drip, patient troponins trended up from  0.22-0.86.  Started on heparin drip, seen by [facility] [place] from cardiology, had a cardiac cath yesterday, showed 100% lesion in the ramus, mid LAD, ostial circumflex to proximal, patient has multivessel coronary artery disease including mid LAD ramus as well as proximal circumflex left dominant system, patient needs CABG at discharge center, spoke with Dr. Call wood today, Dr. Bertell Maria at [facility] will be accepting physician, patient continues to have midsternal chest pressure so we will get intensity more for nitroglycerin, heparin drip will be continued during the transfer as well.  #2. hypertensive emergency, initial blood pressure systolic more than 824, patient started on home medicines including Norvasc, blocker, clonidine increase the dose of Imdur from 30 mg to 60 mg daily, blood pressure is better this morning. 3.  CKD stage III: Creatinine level is baseline, monitor kidney function closely.  Noted to have a GFR of 48.  Creatinine is 1.36.    Discharge Condition: Stable   Follow UP  Transfer to [facility] when the bed is available.   Discharge Instructions  and  Discharge Medications     Allergies as of 03/31/2018      Reactions   Aspirin Shortness Of Breath   Lisinopril Shortness Of Breath   Nsaids Swelling   Sulfa Antibiotics Swelling, Rash      Medication List    STOP taking these medications   clopidogrel 75 MG tablet Commonly known as:  PLAVIX     TAKE these medications   allopurinol 100 MG tablet Commonly known as:  ZYLOPRIM TAKE 1 TABLET DAILY   amLODipine 10 MG tablet Commonly known as:  NORVASC Take 10 mg by mouth daily.  calcium carbonate 1250 MG capsule Take 1,250 mg by mouth daily.   cloNIDine 0.1 MG tablet Commonly known as:  CATAPRES Take 0.1 mg by mouth 2 (two) times daily.   heparin 25000-0.45 UT/250ML-% infusion Inject 1,000 Units/hr into the vein continuous.   isosorbide mononitrate 60 MG 24 hr tablet Commonly known as:  IMDUR Take 1  tablet (60 mg total) by mouth daily. What changed:    medication strength  how much to take  how to take this  when to take this   magnesium 30 MG tablet Take 30 mg by mouth 2 (two) times daily.   metoprolol tartrate 25 MG tablet Commonly known as:  LOPRESSOR Take 25 mg by mouth 2 (two) times daily.   multivitamin capsule Take 1 capsule by mouth daily.   TURMERIC PO Take by mouth.   vitamin C 500 MG tablet Commonly known as:  ASCORBIC ACID Take by mouth.   Vitamin D (Ergocalciferol) 1.25 MG (50000 UT) Caps capsule Commonly known as:  DRISDOL   vitamin E 200 UNIT capsule Take by mouth.         Diet and Activity recommendation: See Discharge Instructions above   Consults obtained -cardiology   Major procedures and Radiology Reports - PLEASE review detailed and final reports for all details, in brief -      Dg Chest 2 View  Result Date: 03/28/2018 CLINICAL DATA:  83 y/o M; intermittent, nonradiating, central chest pain. EXAM: CHEST - 2 VIEW COMPARISON:  12/25/2013 chest radiograph FINDINGS: Stable normal cardiac silhouette given projection and technique. Uncoiled aorta. Aortic atherosclerosis with calcification. Reticular and peripheral linear opacities of the lungs. No consolidation. No pleural effusion or pneumothorax. No acute osseous abnormality is evident. IMPRESSION: Mild interstitial edema. No consolidation. Aortic Atherosclerosis (ICD10-I70.0). Electronically Signed   By: Kristine Garbe M.D.   On: 03/28/2018 20:41    Micro Results   No results found for this or any previous visit (from the past 240 hour(s)).     Today   Subjective:   Timothy Goodwin still has chest pressure, started on heparin drip, Nitropaste, stable for discharge when the bed is available at [facility] for CABG.  Objective:   Blood pressure 135/77, pulse 65, temperature 98.6 F (37 C), temperature source Oral, resp. rate 20, height 5\' 5"  (1.651 m), weight 73.2 kg, SpO2  92 %.   Intake/Output Summary (Last 24 hours) at 03/31/2018 1550 Last data filed at 03/31/2018 1500 Gross per 24 hour  Intake 645.05 ml  Output 1600 ml  Net -954.95 ml    Exam Awake Alert, Oriented x 3, No new F.N deficits, Normal affect [place].AT,PERRAL Supple Neck,No JVD, No cervical lymphadenopathy appriciated.  Symmetrical Chest wall movement, Good air movement bilaterally, CTAB RRR,No Gallops,Rubs or new Murmurs, No Parasternal Heave +ve B.Sounds, Abd Soft, Non tender, No organomegaly appriciated, No rebound -guarding or rigidity. No Cyanosis, Clubbing or edema, No new Rash or bruise  Data Review   CBC w Diff:  Lab Results  Component Value Date   WBC 5.9 03/30/2018   HGB 13.6 03/30/2018   HGB 11.8 (L) 12/28/2013   HCT 41.6 03/30/2018   HCT 35.1 (L) 12/28/2013   PLT 222 03/30/2018   PLT 176 12/28/2013   LYMPHOPCT 15.6 12/28/2013   MONOPCT 7.8 12/28/2013   EOSPCT 3.7 12/28/2013   BASOPCT 0.4 12/28/2013    CMP:  Lab Results  Component Value Date   NA 141 03/29/2018   NA 143 12/28/2013   K 4.1 03/29/2018  K 3.3 (L) 12/28/2013   CL 111 03/29/2018   CL 109 (H) 12/28/2013   CO2 23 03/29/2018   CO2 27 12/28/2013   BUN 26 (H) 03/29/2018   BUN 17 12/28/2013   CREATININE 1.36 (H) 03/29/2018   CREATININE 1.39 (H) 12/28/2013  .   Total Time in preparing paper work, data evaluation and todays exam - 66 minutes  Fritzi Mandes M.D on 03/31/2018 at 3:50 PM    Note: This dictation was prepared with Dragon dictation along with smaller phrase technology. Any transcriptional errors that result from this process are unintentional.

## 2018-04-05 DIAGNOSIS — Z951 Presence of aortocoronary bypass graft: Secondary | ICD-10-CM

## 2018-04-05 HISTORY — PX: CORONARY ARTERY BYPASS GRAFT: SHX141

## 2018-04-05 HISTORY — DX: Presence of aortocoronary bypass graft: Z95.1

## 2018-04-07 DIAGNOSIS — N170 Acute kidney failure with tubular necrosis: Secondary | ICD-10-CM | POA: Insufficient documentation

## 2018-04-09 DIAGNOSIS — I4891 Unspecified atrial fibrillation: Secondary | ICD-10-CM | POA: Insufficient documentation

## 2018-04-10 HISTORY — PX: TEE WITH CARDIOVERSION: SHX5442

## 2018-04-18 IMAGING — US US EXTREM LOW VENOUS*L*
1 series · 13 of 24 positions shown · non-contrast
Comparison: None.

CLINICAL DATA: Left lower extremity pain and edema following fall
approximately 4 weeks ago. History of prostate cancer. Evaluate for
DVT.



[Series 1: us extrem low venous*left* · 0.08mm/px · 13 of 34 slices shown]
[im 1/34]
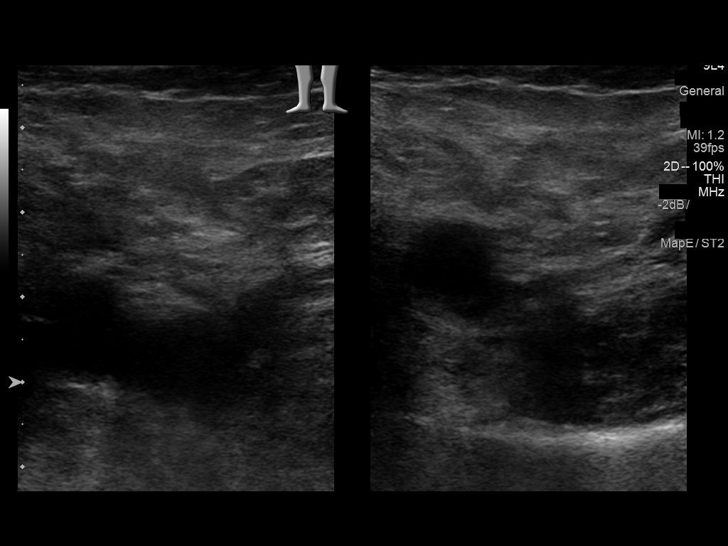
[im 3/34]
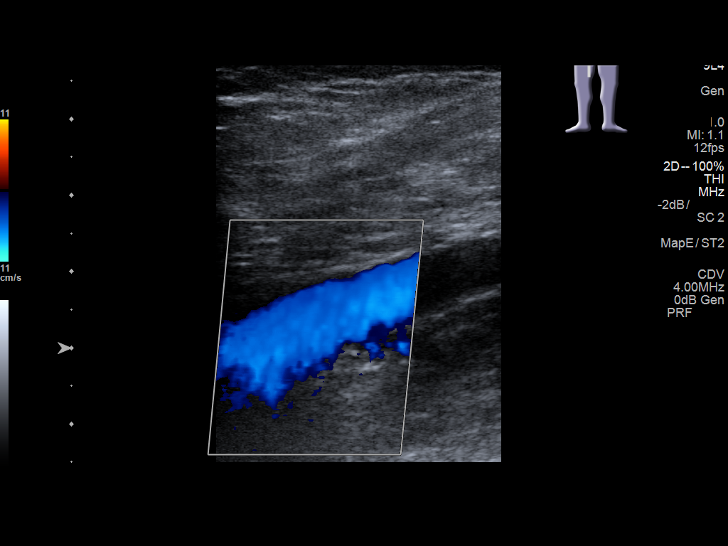
[im 6/34]
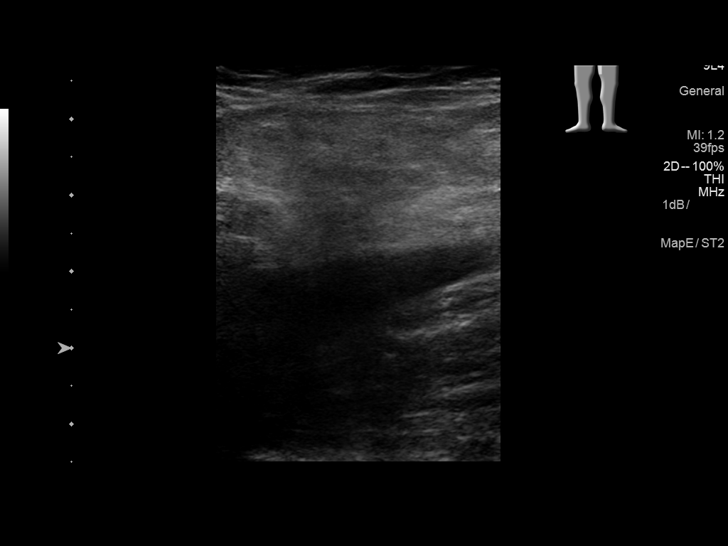
[im 9/34]
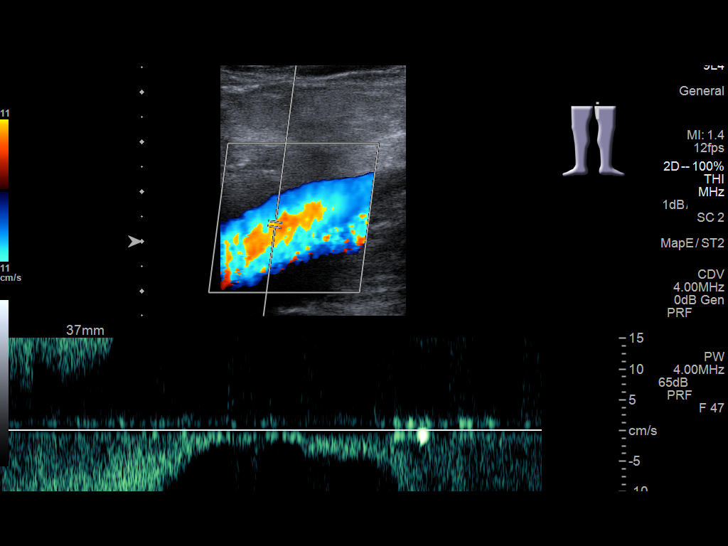
[im 12/34]
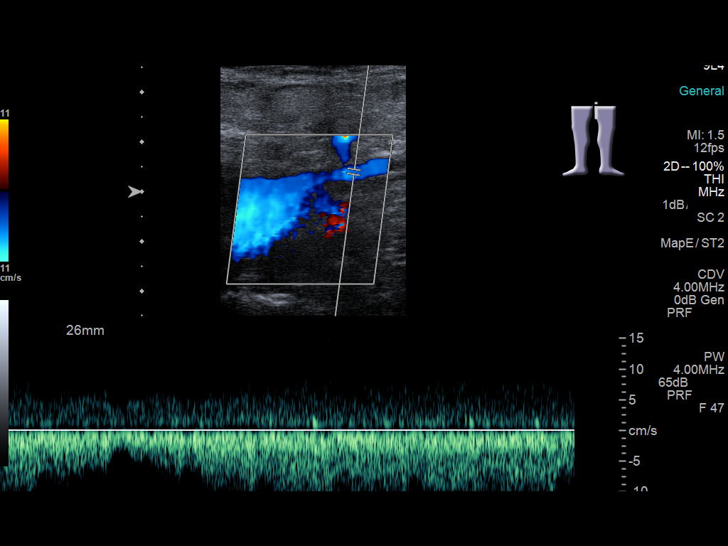
[im 15/34]
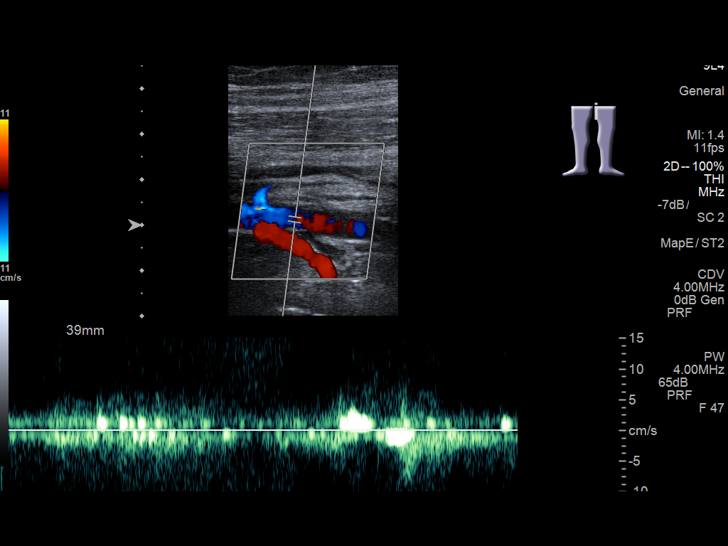
[im 18/34]
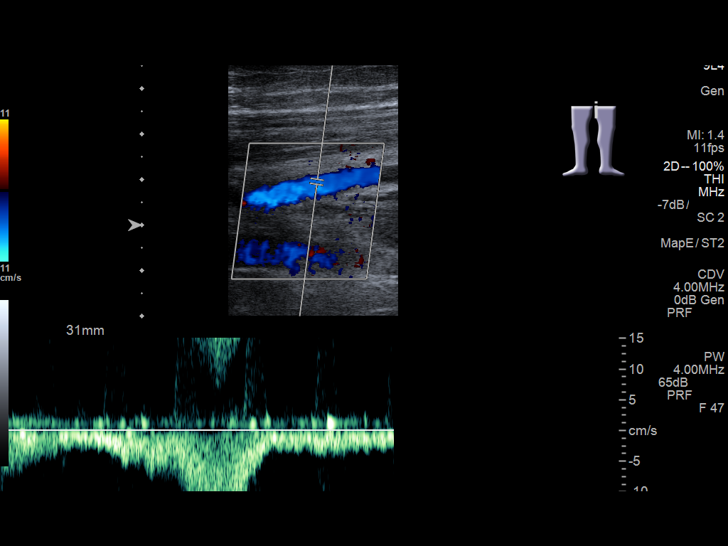
[im 19/34]
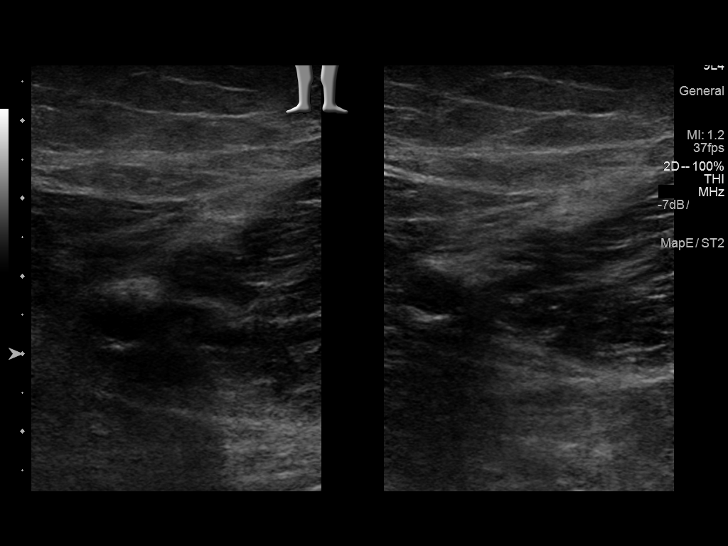
[im 22/34]
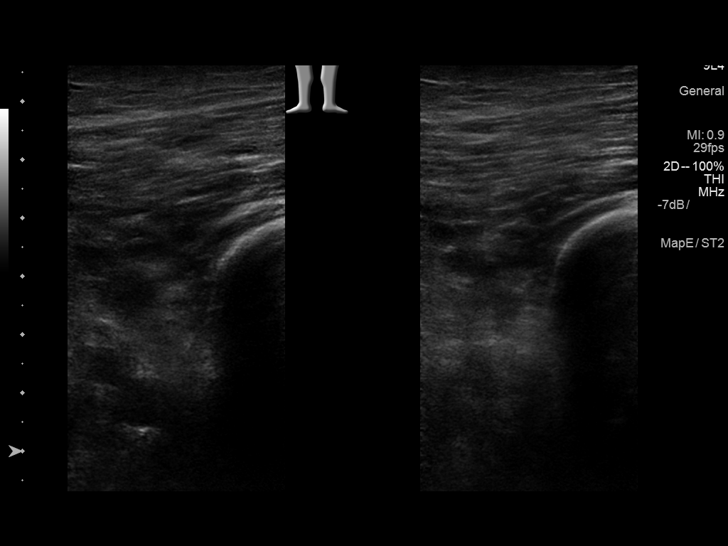
[im 25/34]
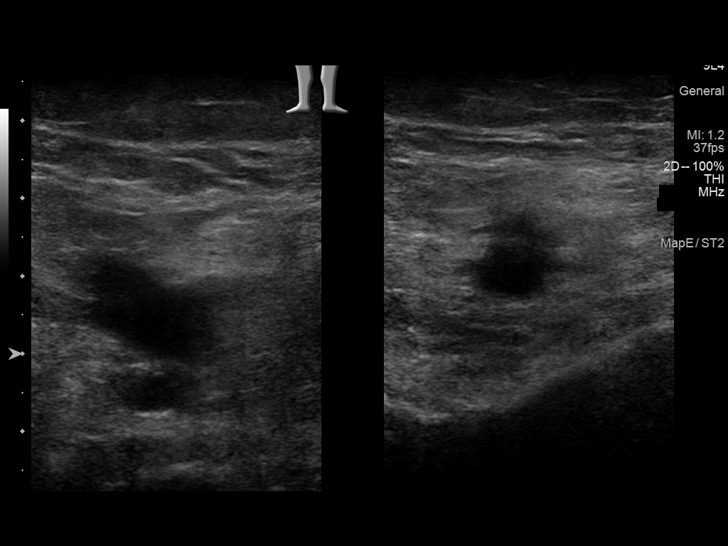
[im 28/34]
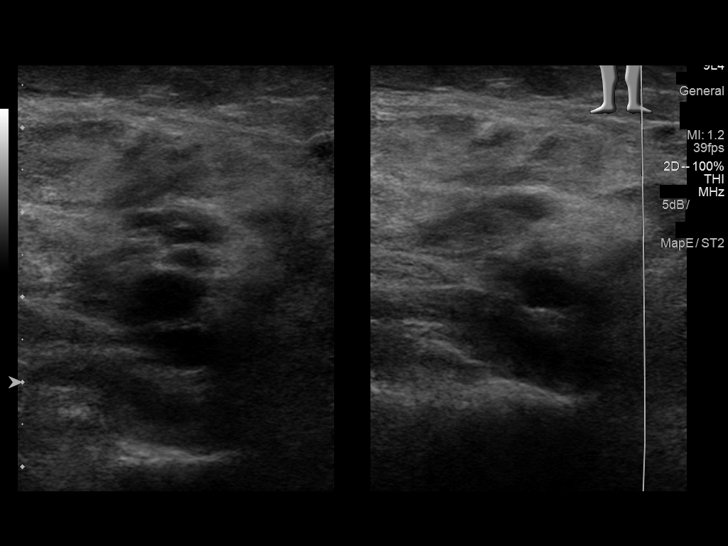
[im 31/34]
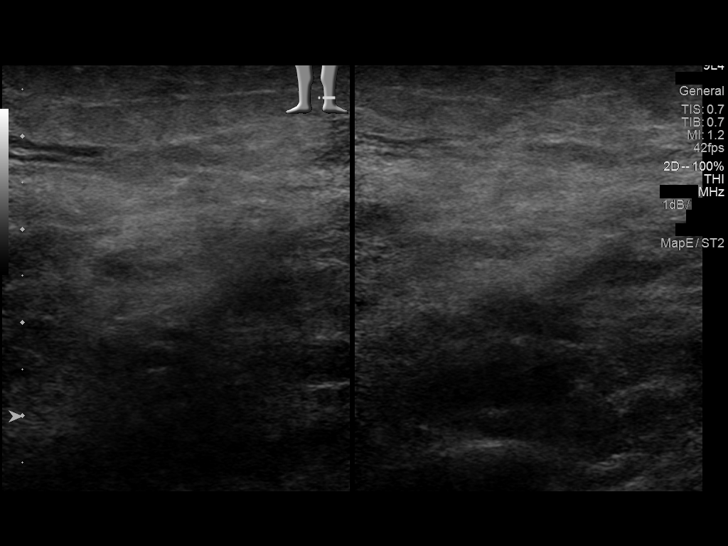
[im 34/34]
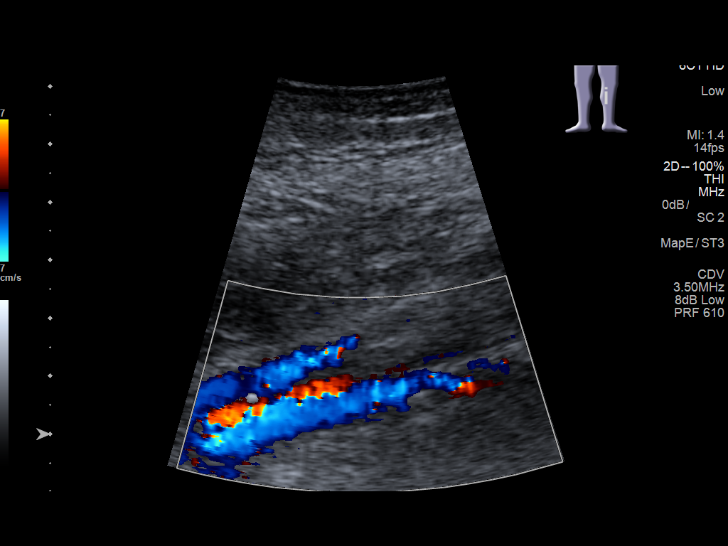

[13 of 24 positions shown; findings below may reference images not displayed]

FINDINGS: Contralateral Common Femoral Vein: Respiratory phasicity is normal
and symmetric with the symptomatic side. No evidence of thrombus.
Normal compressibility.

Common Femoral Vein: No evidence of thrombus. Normal
compressibility, respiratory phasicity and response to augmentation.

Saphenofemoral Junction: No evidence of thrombus. Normal
compressibility and flow on color Doppler imaging.

Profunda Femoral Vein: No evidence of thrombus. Normal
compressibility and flow on color Doppler imaging.

Femoral Vein: No evidence of thrombus. Normal compressibility,
respiratory phasicity and response to augmentation.

Popliteal Vein: No evidence of thrombus. Normal compressibility,
respiratory phasicity and response to augmentation.

Calf Veins: No evidence of thrombus. Normal compressibility and flow
on color Doppler imaging.

Superficial Great Saphenous Vein: No evidence of thrombus. Normal
compressibility and flow on color Doppler imaging.

Venous Reflux:  None.

Other Findings:  None.
IMPRESSION: No evidence of DVT within the left lower extremity.

## 2018-04-19 NOTE — Care Plan (Signed)
  Problem: Occupational Therapy: Activities of Daily Living Goal: Dressing Description STG: Patient will complete upper body dressing and complete lower body dressing  in a chair using adaptive equipment as needed with modified independence  within 2 weeks.  Outcome: Met/ Completed Goal: Grooming Description STG: Patient will complete grooming at the sink using no adaptive equipment with supervision  within 2 weeks.   Outcome: Met/ Completed Goal: Bathroom Access Description STG: Patient will access bathroom/toilet using the least restrictive assistive device with modified independence  within 2 weeks.  Outcome: Met/ Completed Goal: Safety Precautions Description STG: Patient will demonstrate adherence to sternal precautions independently within 2 weeks.   Outcome: Met/ Completed  Velia HERO. Billy OTR/L Pager; [phone]

## 2018-05-16 ENCOUNTER — Encounter: Payer: Self-pay | Admitting: *Deleted

## 2018-05-16 ENCOUNTER — Encounter: Payer: Medicare PPO | Attending: Internal Medicine | Admitting: *Deleted

## 2018-05-16 VITALS — Ht 65.1 in | Wt 154.0 lb

## 2018-05-16 DIAGNOSIS — I214 Non-ST elevation (NSTEMI) myocardial infarction: Secondary | ICD-10-CM | POA: Insufficient documentation

## 2018-05-16 DIAGNOSIS — E785 Hyperlipidemia, unspecified: Secondary | ICD-10-CM | POA: Insufficient documentation

## 2018-05-16 DIAGNOSIS — Z951 Presence of aortocoronary bypass graft: Secondary | ICD-10-CM | POA: Insufficient documentation

## 2018-05-16 NOTE — Progress Notes (Signed)
Daily Session Note  Patient Details  Name: Timothy Goodwin MRN: [id number] Date of Birth: [birth year] Referring Provider:     Cardiac Rehab from 05/16/2018 in [facility]  Referring Provider  Lujean Amel MD      Encounter Date: 05/16/2018  Check In: Session Check In - 05/16/18 1235      Check-In   Supervising physician immediately available to respond to emergencies  See telemetry face sheet for immediately available ER MD    Location  [facility]    Staff Present  Renita Papa, RN BSN;Anella Nakata Luan Pulling, MA, RCEP, CCRP, Exercise Physiologist    Warm-up and Cool-down  Not performed (comment)   med review completed   Resistance Training Performed  Yes    VAD Patient?  No    PAD/SET Patient?  No      Pain Assessment   Currently in Pain?  No/denies        Exercise Prescription Changes - 05/16/18 1500      Response to Exercise   Blood Pressure (Admit)  146/74    Blood Pressure (Exercise)  156/64    Blood Pressure (Exit)  146/72    Heart Rate (Admit)  74 bpm    Heart Rate (Exercise)  98 bpm    Heart Rate (Exit)  89 bpm    Oxygen Saturation (Admit)  99 %    Oxygen Saturation (Exercise)  100 %    Rating of Perceived Exertion (Exercise)  13    Symptoms  none    Comments  walk test results       Social History   Tobacco Use  Smoking Status Never Smoker  Smokeless Tobacco Never Used    Goals Met:  Exercise tolerated well Personal goals reviewed No report of cardiac concerns or symptoms Strength training completed today  Goals Unmet:  Not Applicable  Comments: Completed medical evaulation and walk test  Dr. Emily Filbert is Medical Director for [facility].

## 2018-05-16 NOTE — Progress Notes (Signed)
Cardiac Individual Treatment Plan  Patient Details  Name: Timothy Goodwin MRN: [id number] Date of Birth: [birth year] Referring Provider:     Cardiac Rehab from 05/16/2018 in [facility] Cardiac and Pulmonary Rehab  Referring Provider  Lujean Amel MD      Initial Encounter Date:    Cardiac Rehab from 05/16/2018 in [facility] Cardiac and Pulmonary Rehab  Date  05/16/18      Visit Diagnosis: S/P CABG x 2  Patient's Home Medications on Admission:  Current Outpatient Medications:  .  allopurinol (ZYLOPRIM) 100 MG tablet, TAKE 1 TABLET DAILY, Disp: , Rfl:  .  amiodarone (PACERONE) 200 MG tablet, , Disp: , Rfl:  .  atorvastatin (LIPITOR) 40 MG tablet, , Disp: , Rfl:  .  calcium carbonate 1250 MG capsule, Take 1,250 mg by mouth daily. , Disp: , Rfl:  .  clopidogrel (PLAVIX) 75 MG tablet, Take by mouth., Disp: , Rfl:  .  furosemide (LASIX) 20 MG tablet, , Disp: , Rfl:  .  magnesium 30 MG tablet, Take 30 mg by mouth 2 (two) times daily., Disp: , Rfl:  .  metoprolol tartrate (LOPRESSOR) 25 MG tablet, Take 25 mg by mouth 2 (two) times daily. , Disp: , Rfl:  .  Multiple Vitamin (MULTIVITAMIN) capsule, Take 1 capsule by mouth daily., Disp: , Rfl:  .  pantoprazole (PROTONIX) 40 MG tablet, , Disp: , Rfl:  .  potassium chloride (MICRO-K) 10 MEQ CR capsule, , Disp: , Rfl:  .  vitamin C (ASCORBIC ACID) 500 MG tablet, Take by mouth., Disp: , Rfl:  .  Vitamin D, Ergocalciferol, (DRISDOL) 50000 units CAPS capsule, , Disp: , Rfl:  .  vitamin E 200 UNIT capsule, Take by mouth., Disp: , Rfl:  .  amLODipine (NORVASC) 10 MG tablet, Take 10 mg by mouth daily. , Disp: , Rfl:  .  cloNIDine (CATAPRES) 0.1 MG tablet, Take 0.1 mg by mouth 2 (two) times daily. , Disp: , Rfl:  .  heparin 25000-0.45 UT/250ML-% infusion, Inject 1,000 Units/hr into the vein continuous. (Patient not taking: Reported on 05/16/2018), Disp: 10 mL, Rfl: 0 .  isosorbide mononitrate (IMDUR) 60 MG 24 hr tablet, Take 1 tablet (60 mg total) by mouth daily.  (Patient not taking: Reported on 05/16/2018), Disp: 30 tablet, Rfl: 0 .  TURMERIC PO, Take by mouth., Disp: , Rfl:   Past Medical History: Past Medical History:  Diagnosis Date  . Hypertension     Tobacco Use: Social History   Tobacco Use  Smoking Status Never Smoker  Smokeless Tobacco Never Used    Labs: Recent Review Scientist, physiological    Labs for ITP Cardiac and Pulmonary Rehab Latest Ref Rng & Units 12/25/2013   Cholestrol 0 - 200 mg/dL 261(H)   LDLCALC 0 - 100 mg/dL 178(H)   HDL 40 - 60 mg/dL 39(L)   Trlycerides 0 - 200 mg/dL 221(H)       Exercise Target Goals: Exercise Program Goal: Individual exercise prescription set using results from initial 6 min walk test and THRR while considering  patient's activity barriers and safety.   Exercise Prescription Goal: Initial exercise prescription builds to 30-45 minutes a day of aerobic activity, 2-3 days per week.  Home exercise guidelines will be given to patient during program as part of exercise prescription that the participant will acknowledge.  Activity Barriers & Risk Stratification: Activity Barriers & Cardiac Risk Stratification - 05/16/18 1307      Activity Barriers & Cardiac Risk Stratification   Activity Barriers  None    Cardiac Risk Stratification  High       6 Minute Walk: 6 Minute Walk    Row Name 05/16/18 1528         6 Minute Walk   Phase  Initial     Distance  1480 feet     Walk Time  6 minutes     # of Rest Breaks  0     MPH  2.8     METS  2.85     RPE  13     VO2 Peak  9.96     Symptoms  No     Resting HR  74 bpm     Resting BP  146/74     Resting Oxygen Saturation   99 %     Exercise Oxygen Saturation  during 6 min walk  100 %     Max Ex. HR  98 bpm     Max Ex. BP  156/64     2 Minute Post BP  146/72        Oxygen Initial Assessment:   Oxygen Re-Evaluation:   Oxygen Discharge (Final Oxygen Re-Evaluation):   Initial Exercise Prescription: Initial Exercise Prescription -  05/16/18 1500      Date of Initial Exercise RX and Referring Provider   Date  05/16/18    Referring Provider  Lujean Amel MD      Treadmill   MPH  2.4    Grade  0.5    Minutes  15    METs  2.84      REL-XR   Level  1    Speed  50    Minutes  15    METs  2.8      T5 Nustep   Level  1    SPM  80    Minutes  15    METs  2.5      Prescription Details   Frequency (times per week)  2    Duration  Progress to 30 minutes of continuous aerobic without signs/symptoms of physical distress      Intensity   THRR 40-80% of Max Heartrate  100-126    Ratings of Perceived Exertion  11-13    Perceived Dyspnea  0-4      Progression   Progression  Continue to progress workloads to maintain intensity without signs/symptoms of physical distress.      Resistance Training   Training Prescription  Yes    Weight  3 lbs    Reps  10-15       Perform Capillary Blood Glucose checks as needed.  Exercise Prescription Changes: Exercise Prescription Changes    Row Name 05/16/18 1500             Response to Exercise   Blood Pressure (Admit)  146/74       Blood Pressure (Exercise)  156/64       Blood Pressure (Exit)  146/72       Heart Rate (Admit)  74 bpm       Heart Rate (Exercise)  98 bpm       Heart Rate (Exit)  89 bpm       Oxygen Saturation (Admit)  99 %       Oxygen Saturation (Exercise)  100 %       Rating of Perceived Exertion (Exercise)  13       Symptoms  none       Comments  walk  test results          Exercise Comments:   Exercise Goals and Review: Exercise Goals    Row Name 05/16/18 1532             Exercise Goals   Increase Physical Activity  Yes       Intervention  Provide advice, education, support and counseling about physical activity/exercise needs.;Develop an individualized exercise prescription for aerobic and resistive training based on initial evaluation findings, risk stratification, comorbidities and participant's personal goals.        Expected Outcomes  Short Term: Attend rehab on a regular basis to increase amount of physical activity.;Long Term: Add in home exercise to make exercise part of routine and to increase amount of physical activity.;Long Term: Exercising regularly at least 3-5 days a week.       Increase Strength and Stamina  Yes       Intervention  Develop an individualized exercise prescription for aerobic and resistive training based on initial evaluation findings, risk stratification, comorbidities and participant's personal goals.;Provide advice, education, support and counseling about physical activity/exercise needs.       Expected Outcomes  Short Term: Increase workloads from initial exercise prescription for resistance, speed, and METs.;Short Term: Perform resistance training exercises routinely during rehab and add in resistance training at home;Long Term: Improve cardiorespiratory fitness, muscular endurance and strength as measured by increased METs and functional capacity (6MWT)       Able to understand and use rate of perceived exertion (RPE) scale  Yes       Intervention  Provide education and explanation on how to use RPE scale       Expected Outcomes  Short Term: Able to use RPE daily in rehab to express subjective intensity level;Long Term:  Able to use RPE to guide intensity level when exercising independently       Knowledge and understanding of Target Heart Rate Range (THRR)  Yes       Intervention  Provide education and explanation of THRR including how the numbers were predicted and where they are located for reference       Expected Outcomes  Short Term: Able to state/look up THRR;Short Term: Able to use daily as guideline for intensity in rehab;Long Term: Able to use THRR to govern intensity when exercising independently       Able to check pulse independently  Yes       Intervention  Provide education and demonstration on how to check pulse in carotid and radial arteries.;Review the importance of  being able to check your own pulse for safety during independent exercise       Expected Outcomes  Long Term: Able to check pulse independently and accurately;Short Term: Able to explain why pulse checking is important during independent exercise       Understanding of Exercise Prescription  Yes       Intervention  Provide education, explanation, and written materials on patient's individual exercise prescription       Expected Outcomes  Short Term: Able to explain program exercise prescription;Long Term: Able to explain home exercise prescription to exercise independently          Exercise Goals Re-Evaluation :   Discharge Exercise Prescription (Final Exercise Prescription Changes): Exercise Prescription Changes - 05/16/18 1500      Response to Exercise   Blood Pressure (Admit)  146/74    Blood Pressure (Exercise)  156/64    Blood Pressure (Exit)  146/72  Heart Rate (Admit)  74 bpm    Heart Rate (Exercise)  98 bpm    Heart Rate (Exit)  89 bpm    Oxygen Saturation (Admit)  99 %    Oxygen Saturation (Exercise)  100 %    Rating of Perceived Exertion (Exercise)  13    Symptoms  none    Comments  walk test results       Nutrition:  Target Goals: Understanding of nutrition guidelines, daily intake of sodium '1500mg'$ , cholesterol '200mg'$ , calories 30% from fat and 7% or less from saturated fats, daily to have 5 or more servings of fruits and vegetables.  Biometrics: Pre Biometrics - 05/16/18 1532      Pre Biometrics   Height  5' 5.1" (1.654 m)    Weight  154 lb (69.9 kg)    Waist Circumference  37 inches    Hip Circumference  36.5 inches    Waist to Hip Ratio  1.01 %    BMI (Calculated)  25.53    Single Leg Stand  18.1 seconds        Nutrition Therapy Plan and Nutrition Goals: Nutrition Therapy & Goals - 05/16/18 1305      Intervention Plan   Intervention  Prescribe, educate and counsel regarding individualized specific dietary modifications aiming towards targeted core  components such as weight, hypertension, lipid management, diabetes, heart failure and other comorbidities.;Nutrition handout(s) given to patient.    Expected Outcomes  Short Term Goal: Understand basic principles of dietary content, such as calories, fat, sodium, cholesterol and nutrients.;Short Term Goal: A plan has been developed with personal nutrition goals set during dietitian appointment.;Long Term Goal: Adherence to prescribed nutrition plan.       Nutrition Assessments: Nutrition Assessments - 05/16/18 1306      MEDFICTS Scores   Pre Score  37       Nutrition Goals Re-Evaluation:   Nutrition Goals Discharge (Final Nutrition Goals Re-Evaluation):   Psychosocial: Target Goals: Acknowledge presence or absence of significant depression and/or stress, maximize coping skills, provide positive support system. Participant is able to verbalize types and ability to use techniques and skills needed for reducing stress and depression.   Initial Review & Psychosocial Screening: Initial Psych Review & Screening - 05/16/18 1304      Initial Review   Current issues with  None Identified      Family Dynamics   Good Support System?  Yes   spouse and adult children   Comments  Dainel and his wife have lived in [place] for about the last 6 years. They love it and enjoy the community setting.       Barriers   Psychosocial barriers to participate in program  The patient should benefit from training in stress management and relaxation.;There are no identifiable barriers or psychosocial needs.      Screening Interventions   Interventions  Encouraged to exercise;Program counselor consult;To provide support and resources with identified psychosocial needs;Provide feedback about the scores to participant    Expected Outcomes  Short Term goal: Utilizing psychosocial counselor, staff and physician to assist with identification of specific Stressors or current issues interfering with healing  process. Setting desired goal for each stressor or current issue identified.;Long Term Goal: Stressors or current issues are controlled or eliminated.;Short Term goal: Identification and review with participant of any Quality of Life or Depression concerns found by scoring the questionnaire.;Long Term goal: The participant improves quality of Life and PHQ9 Scores as seen by post scores  and/or verbalization of changes       Quality of Life Scores:  Quality of Life - 05/16/18 1305      Quality of Life   Select  Quality of Life      Quality of Life Scores   Health/Function Pre  26.25 %    Socioeconomic Pre  27.5 %    Psych/Spiritual Pre  26.79 %    Family Pre  25.9 %    GLOBAL Pre  24.48 %      Scores of 19 and below usually indicate a poorer quality of life in these areas.  A difference of  2-3 points is a clinically meaningful difference.  A difference of 2-3 points in the total score of the Quality of Life Index has been associated with significant improvement in overall quality of life, self-image, physical symptoms, and general health in studies assessing change in quality of life.  PHQ-9: Recent Review Flowsheet Data    Depression screen [facility] 2/9 05/16/2018   Decreased Interest 0   Down, Depressed, Hopeless 0   PHQ - 2 Score 0   Altered sleeping 0   Tired, decreased energy 0   Change in appetite 0   Feeling bad or failure about yourself  0   Trouble concentrating 0   Moving slowly or fidgety/restless 0   Suicidal thoughts 0   PHQ-9 Score 0     Interpretation of Total Score  Total Score Depression Severity:  1-4 = Minimal depression, 5-9 = Mild depression, 10-14 = Moderate depression, 15-19 = Moderately severe depression, 20-27 = Severe depression   Psychosocial Evaluation and Intervention:   Psychosocial Re-Evaluation:   Psychosocial Discharge (Final Psychosocial Re-Evaluation):   Vocational Rehabilitation: Provide vocational rehab assistance to qualifying  candidates.   Vocational Rehab Evaluation & Intervention: Vocational Rehab - 05/16/18 1304      Initial Vocational Rehab Evaluation & Intervention   Assessment shows need for Vocational Rehabilitation  No       Education: Education Goals: Education classes will be provided on a variety of topics geared toward better understanding of heart health and risk factor modification. Participant will state understanding/return demonstration of topics presented as noted by education test scores.  Learning Barriers/Preferences: Learning Barriers/Preferences - 05/16/18 1257      Learning Barriers/Preferences   Learning Barriers  None    Learning Preferences  None       Education Topics:  AED/CPR: - Group verbal and written instruction with the use of models to demonstrate the basic use of the AED with the basic ABC's of resuscitation.   General Nutrition Guidelines/Fats and Fiber: -Group instruction provided by verbal, written material, models and posters to present the general guidelines for heart healthy nutrition. Gives an explanation and review of dietary fats and fiber.   Controlling Sodium/Reading Food Labels: -Group verbal and written material supporting the discussion of sodium use in heart healthy nutrition. Review and explanation with models, verbal and written materials for utilization of the food label.   Exercise Physiology & General Exercise Guidelines: - Group verbal and written instruction with models to review the exercise physiology of the cardiovascular system and associated critical values. Provides general exercise guidelines with specific guidelines to those with heart or lung disease.    Aerobic Exercise & Resistance Training: - Gives group verbal and written instruction on the various components of exercise. Focuses on aerobic and resistive training programs and the benefits of this training and how to safely progress through these programs.Marland Kitchen  Flexibility,  Balance, Mind/Body Relaxation: Provides group verbal/written instruction on the benefits of flexibility and balance training, including mind/body exercise modes such as yoga, pilates and tai chi.  Demonstration and skill practice provided.   Stress and Anxiety: - Provides group verbal and written instruction about the health risks of elevated stress and causes of high stress.  Discuss the correlation between heart/lung disease and anxiety and treatment options. Review healthy ways to manage with stress and anxiety.   Depression: - Provides group verbal and written instruction on the correlation between heart/lung disease and depressed mood, treatment options, and the stigmas associated with seeking treatment.   Anatomy & Physiology of the Heart: - Group verbal and written instruction and models provide basic cardiac anatomy and physiology, with the coronary electrical and arterial systems. Review of Valvular disease and Heart Failure   Cardiac Procedures: - Group verbal and written instruction to review commonly prescribed medications for heart disease. Reviews the medication, class of the drug, and side effects. Includes the steps to properly store meds and maintain the prescription regimen. (beta blockers and nitrates)   Cardiac Medications I: - Group verbal and written instruction to review commonly prescribed medications for heart disease. Reviews the medication, class of the drug, and side effects. Includes the steps to properly store meds and maintain the prescription regimen.   Cardiac Medications II: -Group verbal and written instruction to review commonly prescribed medications for heart disease. Reviews the medication, class of the drug, and side effects. (all other drug classes)    Go Sex-Intimacy & Heart Disease, Get SMART - Goal Setting: - Group verbal and written instruction through game format to discuss heart disease and the return to sexual intimacy. Provides group  verbal and written material to discuss and apply goal setting through the application of the S.M.A.R.T. Method.   Other Matters of the Heart: - Provides group verbal, written materials and models to describe Stable Angina and Peripheral Artery. Includes description of the disease process and treatment options available to the cardiac patient.   Exercise & Equipment Safety: - Individual verbal instruction and demonstration of equipment use and safety with use of the equipment.   Cardiac Rehab from 05/16/2018 in [facility] Cardiac and Pulmonary Rehab  Date  05/16/18  Educator  [facility]  Instruction Review Code  1- Verbalizes Understanding      Infection Prevention: - Provides verbal and written material to individual with discussion of infection control including proper hand washing and proper equipment cleaning during exercise session.   Cardiac Rehab from 05/16/2018 in [facility] Cardiac and Pulmonary Rehab  Date  05/16/18  Educator  [facility]  Instruction Review Code  1- Verbalizes Understanding      Falls Prevention: - Provides verbal and written material to individual with discussion of falls prevention and safety.   Cardiac Rehab from 05/16/2018 in [facility] Cardiac and Pulmonary Rehab  Date  05/16/18  Educator  [facility]  Instruction Review Code  1- Verbalizes Understanding      Diabetes: - Individual verbal and written instruction to review signs/symptoms of diabetes, desired ranges of glucose level fasting, after meals and with exercise. Acknowledge that pre and post exercise glucose checks will be done for 3 sessions at entry of program.   Know Your Numbers and Risk Factors: -Group verbal and written instruction about important numbers in your health.  Discussion of what are risk factors and how they play a role in the disease process.  Review of Cholesterol, Blood Pressure, Diabetes, and BMI and the  role they play in your overall health.   Sleep Hygiene: -Provides group verbal and written instruction  about how sleep can affect your health.  Define sleep hygiene, discuss sleep cycles and impact of sleep habits. Review good sleep hygiene tips.    Other: -Provides group and verbal instruction on various topics (see comments)   Knowledge Questionnaire Score: Knowledge Questionnaire Score - 05/16/18 1257      Knowledge Questionnaire Score   Pre Score  24/26   correct answers reviewed with Najeh. Focus on Nutrition      Core Components/Risk Factors/Patient Goals at Admission: Personal Goals and Risk Factors at Admission - 05/16/18 1255      Core Components/Risk Factors/Patient Goals on Admission    Weight Management  Yes;Weight Gain    Intervention  Weight Management: Develop a combined nutrition and exercise program designed to reach desired caloric intake, while maintaining appropriate intake of nutrient and fiber, sodium and fats, and appropriate energy expenditure required for the weight goal.;Weight Management: Provide education and appropriate resources to help participant work on and attain dietary goals.    Admit Weight  154 lb (69.9 kg)    Goal Weight: Short Term  160 lb (72.6 kg)    Expected Outcomes  Short Term: Continue to assess and modify interventions until short term weight is achieved;Long Term: Adherence to nutrition and physical activity/exercise program aimed toward attainment of established weight goal;Weight Maintenance: Understanding of the daily nutrition guidelines, which includes 25-35% calories from fat, 7% or less cal from saturated fats, less than '200mg'$  cholesterol, less than 1.5gm of sodium, & 5 or more servings of fruits and vegetables daily;Understanding recommendations for meals to include 15-35% energy as protein, 25-35% energy from fat, 35-60% energy from carbohydrates, less than '200mg'$  of dietary cholesterol, 20-35 gm of total fiber daily;Understanding of distribution of calorie intake throughout the day with the consumption of 4-5 meals/snacks;Weight Gain:  Understanding of general recommendations for a high calorie, high protein meal plan that promotes weight gain by distributing calorie intake throughout the day with the consumption for 4-5 meals, snacks, and/or supplements    Hypertension  Yes    Intervention  Provide education on lifestyle modifcations including regular physical activity/exercise, weight management, moderate sodium restriction and increased consumption of fresh fruit, vegetables, and low fat dairy, alcohol moderation, and smoking cessation.;Monitor prescription use compliance.    Expected Outcomes  Short Term: Continued assessment and intervention until BP is < 140/35m HG in hypertensive participants. < 130/872mHG in hypertensive participants with diabetes, heart failure or chronic kidney disease.;Long Term: Maintenance of blood pressure at goal levels.    Lipids  Yes    Intervention  Provide education and support for participant on nutrition & aerobic/resistive exercise along with prescribed medications to achieve LDL '70mg'$ , HDL >'40mg'$ .    Expected Outcomes  Short Term: Participant states understanding of desired cholesterol values and is compliant with medications prescribed. Participant is following exercise prescription and nutrition guidelines.;Long Term: Cholesterol controlled with medications as prescribed, with individualized exercise RX and with personalized nutrition plan. Value goals: LDL < '70mg'$ , HDL > 40 mg.       Core Components/Risk Factors/Patient Goals Review:    Core Components/Risk Factors/Patient Goals at Discharge (Final Review):    ITP Comments: ITP Comments    Row Name 05/16/18 1236           ITP Comments  Med Review completed. Initial ITP created. [facility]  Comments: Initial ITP

## 2018-05-16 NOTE — Patient Instructions (Signed)
Patient Instructions  Patient Details  Name: Timothy Goodwin MRN: [id number] Date of Birth: November 01, 1934 Referring Provider:  Yolonda Kida, MD  Below are your personal goals for exercise, nutrition, and risk factors. Our goal is to help you stay on track towards obtaining and maintaining these goals. We will be discussing your progress on these goals with you throughout the program.  Initial Exercise Prescription: Initial Exercise Prescription - 05/16/18 1500      Date of Initial Exercise RX and Referring Provider   Date  05/16/18    Referring Provider  Lujean Amel MD      Treadmill   MPH  2.4    Grade  0.5    Minutes  15    METs  2.84      REL-XR   Level  1    Speed  50    Minutes  15    METs  2.8      T5 Nustep   Level  1    SPM  80    Minutes  15    METs  2.5      Prescription Details   Frequency (times per week)  2    Duration  Progress to 30 minutes of continuous aerobic without signs/symptoms of physical distress      Intensity   THRR 40-80% of Max Heartrate  100-126    Ratings of Perceived Exertion  11-13    Perceived Dyspnea  0-4      Progression   Progression  Continue to progress workloads to maintain intensity without signs/symptoms of physical distress.      Resistance Training   Training Prescription  Yes    Weight  3 lbs    Reps  10-15       Exercise Goals: Frequency: Be able to perform aerobic exercise two to three times per week in program working toward 2-5 days per week of home exercise.  Intensity: Work with a perceived exertion of 11 (fairly light) - 15 (hard) while following your exercise prescription.  We will make changes to your prescription with you as you progress through the program.   Duration: Be able to do 30 to 45 minutes of continuous aerobic exercise in addition to a 5 minute warm-up and a 5 minute cool-down routine.   Nutrition Goals: Your personal nutrition goals will be established when you do your nutrition  analysis with the dietician.  The following are general nutrition guidelines to follow: Cholesterol < 200mg /day Sodium < 1500mg /day Fiber: Men over 50 yrs - 30 grams per day  Personal Goals: Personal Goals and Risk Factors at Admission - 05/16/18 1255      Core Components/Risk Factors/Patient Goals on Admission    Weight Management  Yes;Weight Gain    Intervention  Weight Management: Develop a combined nutrition and exercise program designed to reach desired caloric intake, while maintaining appropriate intake of nutrient and fiber, sodium and fats, and appropriate energy expenditure required for the weight goal.;Weight Management: Provide education and appropriate resources to help participant work on and attain dietary goals.    Admit Weight  154 lb (69.9 kg)    Goal Weight: Short Term  160 lb (72.6 kg)    Expected Outcomes  Short Term: Continue to assess and modify interventions until short term weight is achieved;Long Term: Adherence to nutrition and physical activity/exercise program aimed toward attainment of established weight goal;Weight Maintenance: Understanding of the daily nutrition guidelines, which includes 25-35% calories from fat, 7% or less  cal from saturated fats, less than 200mg  cholesterol, less than 1.5gm of sodium, & 5 or more servings of fruits and vegetables daily;Understanding recommendations for meals to include 15-35% energy as protein, 25-35% energy from fat, 35-60% energy from carbohydrates, less than 200mg  of dietary cholesterol, 20-35 gm of total fiber daily;Understanding of distribution of calorie intake throughout the day with the consumption of 4-5 meals/snacks;Weight Gain: Understanding of general recommendations for a high calorie, high protein meal plan that promotes weight gain by distributing calorie intake throughout the day with the consumption for 4-5 meals, snacks, and/or supplements    Hypertension  Yes    Intervention  Provide education on lifestyle  modifcations including regular physical activity/exercise, weight management, moderate sodium restriction and increased consumption of fresh fruit, vegetables, and low fat dairy, alcohol moderation, and smoking cessation.;Monitor prescription use compliance.    Expected Outcomes  Short Term: Continued assessment and intervention until BP is < 140/69mm HG in hypertensive participants. < 130/73mm HG in hypertensive participants with diabetes, heart failure or chronic kidney disease.;Long Term: Maintenance of blood pressure at goal levels.    Lipids  Yes    Intervention  Provide education and support for participant on nutrition & aerobic/resistive exercise along with prescribed medications to achieve LDL 70mg , HDL >40mg .    Expected Outcomes  Short Term: Participant states understanding of desired cholesterol values and is compliant with medications prescribed. Participant is following exercise prescription and nutrition guidelines.;Long Term: Cholesterol controlled with medications as prescribed, with individualized exercise RX and with personalized nutrition plan. Value goals: LDL < 70mg , HDL > 40 mg.       Tobacco Use Initial Evaluation: Social History   Tobacco Use  Smoking Status Never Smoker  Smokeless Tobacco Never Used    Exercise Goals and Review: Exercise Goals    Row Name 05/16/18 1532             Exercise Goals   Increase Physical Activity  Yes       Intervention  Provide advice, education, support and counseling about physical activity/exercise needs.;Develop an individualized exercise prescription for aerobic and resistive training based on initial evaluation findings, risk stratification, comorbidities and participant's personal goals.       Expected Outcomes  Short Term: Attend rehab on a regular basis to increase amount of physical activity.;Long Term: Add in home exercise to make exercise part of routine and to increase amount of physical activity.;Long Term: Exercising  regularly at least 3-5 days a week.       Increase Strength and Stamina  Yes       Intervention  Develop an individualized exercise prescription for aerobic and resistive training based on initial evaluation findings, risk stratification, comorbidities and participant's personal goals.;Provide advice, education, support and counseling about physical activity/exercise needs.       Expected Outcomes  Short Term: Increase workloads from initial exercise prescription for resistance, speed, and METs.;Short Term: Perform resistance training exercises routinely during rehab and add in resistance training at home;Long Term: Improve cardiorespiratory fitness, muscular endurance and strength as measured by increased METs and functional capacity (6MWT)       Able to understand and use rate of perceived exertion (RPE) scale  Yes       Intervention  Provide education and explanation on how to use RPE scale       Expected Outcomes  Short Term: Able to use RPE daily in rehab to express subjective intensity level;Long Term:  Able to use RPE to guide intensity  level when exercising independently       Knowledge and understanding of Target Heart Rate Range (THRR)  Yes       Intervention  Provide education and explanation of THRR including how the numbers were predicted and where they are located for reference       Expected Outcomes  Short Term: Able to state/look up THRR;Short Term: Able to use daily as guideline for intensity in rehab;Long Term: Able to use THRR to govern intensity when exercising independently       Able to check pulse independently  Yes       Intervention  Provide education and demonstration on how to check pulse in carotid and radial arteries.;Review the importance of being able to check your own pulse for safety during independent exercise       Expected Outcomes  Long Term: Able to check pulse independently and accurately;Short Term: Able to explain why pulse checking is important during  independent exercise       Understanding of Exercise Prescription  Yes       Intervention  Provide education, explanation, and written materials on patient's individual exercise prescription       Expected Outcomes  Short Term: Able to explain program exercise prescription;Long Term: Able to explain home exercise prescription to exercise independently          Copy of goals given to participant.

## 2018-05-18 ENCOUNTER — Encounter: Payer: Self-pay | Admitting: *Deleted

## 2018-05-18 DIAGNOSIS — Z951 Presence of aortocoronary bypass graft: Secondary | ICD-10-CM

## 2018-05-18 NOTE — Progress Notes (Signed)
Cardiac Individual Treatment Plan  Patient Details  Name: Timothy Goodwin MRN: [id number] Date of Birth: [birth year] Referring Provider:     Cardiac Rehab from 05/16/2018 in [facility] Cardiac and Pulmonary Rehab  Referring Provider  Lujean Amel MD      Initial Encounter Date:    Cardiac Rehab from 05/16/2018 in [facility] Cardiac and Pulmonary Rehab  Date  05/16/18      Visit Diagnosis: S/P CABG x 2  Patient's Home Medications on Admission:  Current Outpatient Medications:  .  allopurinol (ZYLOPRIM) 100 MG tablet, TAKE 1 TABLET DAILY, Disp: , Rfl:  .  amiodarone (PACERONE) 200 MG tablet, , Disp: , Rfl:  .  amLODipine (NORVASC) 10 MG tablet, Take 10 mg by mouth daily. , Disp: , Rfl:  .  atorvastatin (LIPITOR) 40 MG tablet, , Disp: , Rfl:  .  calcium carbonate 1250 MG capsule, Take 1,250 mg by mouth daily. , Disp: , Rfl:  .  cloNIDine (CATAPRES) 0.1 MG tablet, Take 0.1 mg by mouth 2 (two) times daily. , Disp: , Rfl:  .  clopidogrel (PLAVIX) 75 MG tablet, Take by mouth., Disp: , Rfl:  .  furosemide (LASIX) 20 MG tablet, , Disp: , Rfl:  .  heparin 25000-0.45 UT/250ML-% infusion, Inject 1,000 Units/hr into the vein continuous. (Patient not taking: Reported on 05/16/2018), Disp: 10 mL, Rfl: 0 .  isosorbide mononitrate (IMDUR) 60 MG 24 hr tablet, Take 1 tablet (60 mg total) by mouth daily. (Patient not taking: Reported on 05/16/2018), Disp: 30 tablet, Rfl: 0 .  magnesium 30 MG tablet, Take 30 mg by mouth 2 (two) times daily., Disp: , Rfl:  .  metoprolol tartrate (LOPRESSOR) 25 MG tablet, Take 25 mg by mouth 2 (two) times daily. , Disp: , Rfl:  .  Multiple Vitamin (MULTIVITAMIN) capsule, Take 1 capsule by mouth daily., Disp: , Rfl:  .  pantoprazole (PROTONIX) 40 MG tablet, , Disp: , Rfl:  .  potassium chloride (MICRO-K) 10 MEQ CR capsule, , Disp: , Rfl:  .  TURMERIC PO, Take by mouth., Disp: , Rfl:  .  vitamin C (ASCORBIC ACID) 500 MG tablet, Take by mouth., Disp: , Rfl:  .  Vitamin D,  Ergocalciferol, (DRISDOL) 50000 units CAPS capsule, , Disp: , Rfl:  .  vitamin E 200 UNIT capsule, Take by mouth., Disp: , Rfl:   Past Medical History: Past Medical History:  Diagnosis Date  . Hypertension     Tobacco Use: Social History   Tobacco Use  Smoking Status Never Smoker  Smokeless Tobacco Never Used    Labs: Recent Review Scientist, physiological    Labs for ITP Cardiac and Pulmonary Rehab Latest Ref Rng & Units 12/25/2013   Cholestrol 0 - 200 mg/dL 261(H)   LDLCALC 0 - 100 mg/dL 178(H)   HDL 40 - 60 mg/dL 39(L)   Trlycerides 0 - 200 mg/dL 221(H)       Exercise Target Goals: Exercise Program Goal: Individual exercise prescription set using results from initial 6 min walk test and THRR while considering  patient's activity barriers and safety.   Exercise Prescription Goal: Initial exercise prescription builds to 30-45 minutes a day of aerobic activity, 2-3 days per week.  Home exercise guidelines will be given to patient during program as part of exercise prescription that the participant will acknowledge.  Activity Barriers & Risk Stratification: Activity Barriers & Cardiac Risk Stratification - 05/16/18 1307      Activity Barriers & Cardiac Risk Stratification   Activity Barriers  None    Cardiac Risk Stratification  High       6 Minute Walk: 6 Minute Walk    Row Name 05/16/18 1528         6 Minute Walk   Phase  Initial     Distance  1480 feet     Walk Time  6 minutes     # of Rest Breaks  0     MPH  2.8     METS  2.85     RPE  13     VO2 Peak  9.96     Symptoms  No     Resting HR  74 bpm     Resting BP  146/74     Resting Oxygen Saturation   99 %     Exercise Oxygen Saturation  during 6 min walk  100 %     Max Ex. HR  98 bpm     Max Ex. BP  156/64     2 Minute Post BP  146/72        Oxygen Initial Assessment:   Oxygen Re-Evaluation:   Oxygen Discharge (Final Oxygen Re-Evaluation):   Initial Exercise Prescription: Initial Exercise  Prescription - 05/16/18 1500      Date of Initial Exercise RX and Referring Provider   Date  05/16/18    Referring Provider  Lujean Amel MD      Treadmill   MPH  2.4    Grade  0.5    Minutes  15    METs  2.84      REL-XR   Level  1    Speed  50    Minutes  15    METs  2.8      T5 Nustep   Level  1    SPM  80    Minutes  15    METs  2.5      Prescription Details   Frequency (times per week)  2    Duration  Progress to 30 minutes of continuous aerobic without signs/symptoms of physical distress      Intensity   THRR 40-80% of Max Heartrate  100-126    Ratings of Perceived Exertion  11-13    Perceived Dyspnea  0-4      Progression   Progression  Continue to progress workloads to maintain intensity without signs/symptoms of physical distress.      Resistance Training   Training Prescription  Yes    Weight  3 lbs    Reps  10-15       Perform Capillary Blood Glucose checks as needed.  Exercise Prescription Changes: Exercise Prescription Changes    Row Name 05/16/18 1500             Response to Exercise   Blood Pressure (Admit)  146/74       Blood Pressure (Exercise)  156/64       Blood Pressure (Exit)  146/72       Heart Rate (Admit)  74 bpm       Heart Rate (Exercise)  98 bpm       Heart Rate (Exit)  89 bpm       Oxygen Saturation (Admit)  99 %       Oxygen Saturation (Exercise)  100 %       Rating of Perceived Exertion (Exercise)  13       Symptoms  none       Comments  walk  test results          Exercise Comments:   Exercise Goals and Review: Exercise Goals    Row Name 05/16/18 1532             Exercise Goals   Increase Physical Activity  Yes       Intervention  Provide advice, education, support and counseling about physical activity/exercise needs.;Develop an individualized exercise prescription for aerobic and resistive training based on initial evaluation findings, risk stratification, comorbidities and participant's personal  goals.       Expected Outcomes  Short Term: Attend rehab on a regular basis to increase amount of physical activity.;Long Term: Add in home exercise to make exercise part of routine and to increase amount of physical activity.;Long Term: Exercising regularly at least 3-5 days a week.       Increase Strength and Stamina  Yes       Intervention  Develop an individualized exercise prescription for aerobic and resistive training based on initial evaluation findings, risk stratification, comorbidities and participant's personal goals.;Provide advice, education, support and counseling about physical activity/exercise needs.       Expected Outcomes  Short Term: Increase workloads from initial exercise prescription for resistance, speed, and METs.;Short Term: Perform resistance training exercises routinely during rehab and add in resistance training at home;Long Term: Improve cardiorespiratory fitness, muscular endurance and strength as measured by increased METs and functional capacity (6MWT)       Able to understand and use rate of perceived exertion (RPE) scale  Yes       Intervention  Provide education and explanation on how to use RPE scale       Expected Outcomes  Short Term: Able to use RPE daily in rehab to express subjective intensity level;Long Term:  Able to use RPE to guide intensity level when exercising independently       Knowledge and understanding of Target Heart Rate Range (THRR)  Yes       Intervention  Provide education and explanation of THRR including how the numbers were predicted and where they are located for reference       Expected Outcomes  Short Term: Able to state/look up THRR;Short Term: Able to use daily as guideline for intensity in rehab;Long Term: Able to use THRR to govern intensity when exercising independently       Able to check pulse independently  Yes       Intervention  Provide education and demonstration on how to check pulse in carotid and radial arteries.;Review the  importance of being able to check your own pulse for safety during independent exercise       Expected Outcomes  Long Term: Able to check pulse independently and accurately;Short Term: Able to explain why pulse checking is important during independent exercise       Understanding of Exercise Prescription  Yes       Intervention  Provide education, explanation, and written materials on patient's individual exercise prescription       Expected Outcomes  Short Term: Able to explain program exercise prescription;Long Term: Able to explain home exercise prescription to exercise independently          Exercise Goals Re-Evaluation :   Discharge Exercise Prescription (Final Exercise Prescription Changes): Exercise Prescription Changes - 05/16/18 1500      Response to Exercise   Blood Pressure (Admit)  146/74    Blood Pressure (Exercise)  156/64    Blood Pressure (Exit)  146/72  Heart Rate (Admit)  74 bpm    Heart Rate (Exercise)  98 bpm    Heart Rate (Exit)  89 bpm    Oxygen Saturation (Admit)  99 %    Oxygen Saturation (Exercise)  100 %    Rating of Perceived Exertion (Exercise)  13    Symptoms  none    Comments  walk test results       Nutrition:  Target Goals: Understanding of nutrition guidelines, daily intake of sodium '1500mg'$ , cholesterol '200mg'$ , calories 30% from fat and 7% or less from saturated fats, daily to have 5 or more servings of fruits and vegetables.  Biometrics: Pre Biometrics - 05/16/18 1532      Pre Biometrics   Height  5' 5.1" (1.654 m)    Weight  154 lb (69.9 kg)    Waist Circumference  37 inches    Hip Circumference  36.5 inches    Waist to Hip Ratio  1.01 %    BMI (Calculated)  25.53    Single Leg Stand  18.1 seconds        Nutrition Therapy Plan and Nutrition Goals: Nutrition Therapy & Goals - 05/16/18 1305      Intervention Plan   Intervention  Prescribe, educate and counsel regarding individualized specific dietary modifications aiming towards  targeted core components such as weight, hypertension, lipid management, diabetes, heart failure and other comorbidities.;Nutrition handout(s) given to patient.    Expected Outcomes  Short Term Goal: Understand basic principles of dietary content, such as calories, fat, sodium, cholesterol and nutrients.;Short Term Goal: A plan has been developed with personal nutrition goals set during dietitian appointment.;Long Term Goal: Adherence to prescribed nutrition plan.       Nutrition Assessments: Nutrition Assessments - 05/16/18 1306      MEDFICTS Scores   Pre Score  37       Nutrition Goals Re-Evaluation:   Nutrition Goals Discharge (Final Nutrition Goals Re-Evaluation):   Psychosocial: Target Goals: Acknowledge presence or absence of significant depression and/or stress, maximize coping skills, provide positive support system. Participant is able to verbalize types and ability to use techniques and skills needed for reducing stress and depression.   Initial Review & Psychosocial Screening: Initial Psych Review & Screening - 05/16/18 1304      Initial Review   Current issues with  None Identified      Family Dynamics   Good Support System?  Yes   spouse and adult children   Comments  Leelynn and his wife have lived in [place] for about the last 6 years. They love it and enjoy the community setting.       Barriers   Psychosocial barriers to participate in program  The patient should benefit from training in stress management and relaxation.;There are no identifiable barriers or psychosocial needs.      Screening Interventions   Interventions  Encouraged to exercise;Program counselor consult;To provide support and resources with identified psychosocial needs;Provide feedback about the scores to participant    Expected Outcomes  Short Term goal: Utilizing psychosocial counselor, staff and physician to assist with identification of specific Stressors or current issues interfering with  healing process. Setting desired goal for each stressor or current issue identified.;Long Term Goal: Stressors or current issues are controlled or eliminated.;Short Term goal: Identification and review with participant of any Quality of Life or Depression concerns found by scoring the questionnaire.;Long Term goal: The participant improves quality of Life and PHQ9 Scores as seen by post scores  and/or verbalization of changes       Quality of Life Scores:  Quality of Life - 05/16/18 1305      Quality of Life   Select  Quality of Life      Quality of Life Scores   Health/Function Pre  26.25 %    Socioeconomic Pre  27.5 %    Psych/Spiritual Pre  26.79 %    Family Pre  25.9 %    GLOBAL Pre  24.48 %      Scores of 19 and below usually indicate a poorer quality of life in these areas.  A difference of  2-3 points is a clinically meaningful difference.  A difference of 2-3 points in the total score of the Quality of Life Index has been associated with significant improvement in overall quality of life, self-image, physical symptoms, and general health in studies assessing change in quality of life.  PHQ-9: Recent Review Flowsheet Data    Depression screen [facility] 2/9 05/16/2018   Decreased Interest 0   Down, Depressed, Hopeless 0   PHQ - 2 Score 0   Altered sleeping 0   Tired, decreased energy 0   Change in appetite 0   Feeling bad or failure about yourself  0   Trouble concentrating 0   Moving slowly or fidgety/restless 0   Suicidal thoughts 0   PHQ-9 Score 0     Interpretation of Total Score  Total Score Depression Severity:  1-4 = Minimal depression, 5-9 = Mild depression, 10-14 = Moderate depression, 15-19 = Moderately severe depression, 20-27 = Severe depression   Psychosocial Evaluation and Intervention:   Psychosocial Re-Evaluation:   Psychosocial Discharge (Final Psychosocial Re-Evaluation):   Vocational Rehabilitation: Provide vocational rehab assistance to qualifying  candidates.   Vocational Rehab Evaluation & Intervention: Vocational Rehab - 05/16/18 1304      Initial Vocational Rehab Evaluation & Intervention   Assessment shows need for Vocational Rehabilitation  No       Education: Education Goals: Education classes will be provided on a variety of topics geared toward better understanding of heart health and risk factor modification. Participant will state understanding/return demonstration of topics presented as noted by education test scores.  Learning Barriers/Preferences: Learning Barriers/Preferences - 05/16/18 1257      Learning Barriers/Preferences   Learning Barriers  None    Learning Preferences  None       Education Topics:  AED/CPR: - Group verbal and written instruction with the use of models to demonstrate the basic use of the AED with the basic ABC's of resuscitation.   General Nutrition Guidelines/Fats and Fiber: -Group instruction provided by verbal, written material, models and posters to present the general guidelines for heart healthy nutrition. Gives an explanation and review of dietary fats and fiber.   Controlling Sodium/Reading Food Labels: -Group verbal and written material supporting the discussion of sodium use in heart healthy nutrition. Review and explanation with models, verbal and written materials for utilization of the food label.   Exercise Physiology & General Exercise Guidelines: - Group verbal and written instruction with models to review the exercise physiology of the cardiovascular system and associated critical values. Provides general exercise guidelines with specific guidelines to those with heart or lung disease.    Aerobic Exercise & Resistance Training: - Gives group verbal and written instruction on the various components of exercise. Focuses on aerobic and resistive training programs and the benefits of this training and how to safely progress through these programs.Marland Kitchen  Flexibility,  Balance, Mind/Body Relaxation: Provides group verbal/written instruction on the benefits of flexibility and balance training, including mind/body exercise modes such as yoga, pilates and tai chi.  Demonstration and skill practice provided.   Stress and Anxiety: - Provides group verbal and written instruction about the health risks of elevated stress and causes of high stress.  Discuss the correlation between heart/lung disease and anxiety and treatment options. Review healthy ways to manage with stress and anxiety.   Depression: - Provides group verbal and written instruction on the correlation between heart/lung disease and depressed mood, treatment options, and the stigmas associated with seeking treatment.   Anatomy & Physiology of the Heart: - Group verbal and written instruction and models provide basic cardiac anatomy and physiology, with the coronary electrical and arterial systems. Review of Valvular disease and Heart Failure   Cardiac Procedures: - Group verbal and written instruction to review commonly prescribed medications for heart disease. Reviews the medication, class of the drug, and side effects. Includes the steps to properly store meds and maintain the prescription regimen. (beta blockers and nitrates)   Cardiac Medications I: - Group verbal and written instruction to review commonly prescribed medications for heart disease. Reviews the medication, class of the drug, and side effects. Includes the steps to properly store meds and maintain the prescription regimen.   Cardiac Medications II: -Group verbal and written instruction to review commonly prescribed medications for heart disease. Reviews the medication, class of the drug, and side effects. (all other drug classes)    Go Sex-Intimacy & Heart Disease, Get SMART - Goal Setting: - Group verbal and written instruction through game format to discuss heart disease and the return to sexual intimacy. Provides group  verbal and written material to discuss and apply goal setting through the application of the S.M.A.R.T. Method.   Other Matters of the Heart: - Provides group verbal, written materials and models to describe Stable Angina and Peripheral Artery. Includes description of the disease process and treatment options available to the cardiac patient.   Exercise & Equipment Safety: - Individual verbal instruction and demonstration of equipment use and safety with use of the equipment.   Cardiac Rehab from 05/16/2018 in [facility] Cardiac and Pulmonary Rehab  Date  05/16/18  Educator  [facility]  Instruction Review Code  1- Verbalizes Understanding      Infection Prevention: - Provides verbal and written material to individual with discussion of infection control including proper hand washing and proper equipment cleaning during exercise session.   Cardiac Rehab from 05/16/2018 in [facility] Cardiac and Pulmonary Rehab  Date  05/16/18  Educator  [facility]  Instruction Review Code  1- Verbalizes Understanding      Falls Prevention: - Provides verbal and written material to individual with discussion of falls prevention and safety.   Cardiac Rehab from 05/16/[facility] in [facility] [address] Cardiac and Pulmonary Rehab  Date  05/16/18  Educator  [facility]  Instruction Review Code  1- Verbalizes Understanding      Diabetes: - Individual verbal and written instruction to review signs/symptoms of diabetes, desired ranges of glucose level fasting, after meals and with exercise. Acknowledge that pre and post exercise glucose checks will be done for 3 sessions at entry of program.   Know Your Numbers and Risk Factors: -Group verbal and written instruction about important numbers in your health.  Discussion of what are risk factors and how they play a role in the disease process.  Review of Cholesterol, Blood Pressure, Diabetes, and BMI and the  role they play in your overall health.   Sleep Hygiene: -Provides group verbal and written instruction  about how sleep can affect your health.  Define sleep hygiene, discuss sleep cycles and impact of sleep habits. Review good sleep hygiene tips.    Other: -Provides group and verbal instruction on various topics (see comments)   Knowledge Questionnaire Score: Knowledge Questionnaire Score - 05/16/18 1257      Knowledge Questionnaire Score   Pre Score  24/26   correct answers reviewed with [place]. Focus on Nutrition      Core Components/Risk Factors/Patient Goals at Admission: Personal Goals and Risk Factors at Admission - 05/16/18 1255      Core Components/Risk Factors/Patient Goals on Admission    Weight Management  Yes;Weight Gain    Intervention  Weight Management: Develop a combined nutrition and exercise program designed to reach desired caloric intake, while maintaining appropriate intake of nutrient and fiber, sodium and fats, and appropriate energy expenditure required for the weight goal.;Weight Management: Provide education and appropriate resources to help participant work on and attain dietary goals.    Admit Weight  154 lb (69.9 kg)    Goal Weight: Short Term  160 lb (72.6 kg)    Expected Outcomes  Short Term: Continue to assess and modify interventions until short term weight is achieved;Long Term: Adherence to nutrition and physical activity/exercise program aimed toward attainment of established weight goal;Weight Maintenance: Understanding of the daily nutrition guidelines, which includes 25-35% calories from fat, 7% or less cal from saturated fats, less than '200mg'$  cholesterol, less than 1.5gm of sodium, & 5 or more servings of fruits and vegetables daily;Understanding recommendations for meals to include 15-35% energy as protein, 25-35% energy from fat, 35-60% energy from carbohydrates, less than '200mg'$  of dietary cholesterol, 20-35 gm of total fiber daily;Understanding of distribution of calorie intake throughout the day with the consumption of 4-5 meals/snacks;Weight Gain:  Understanding of general recommendations for a high calorie, high protein meal plan that promotes weight gain by distributing calorie intake throughout the day with the consumption for 4-5 meals, snacks, and/or supplements    Hypertension  Yes    Intervention  Provide education on lifestyle modifcations including regular physical activity/exercise, weight management, moderate sodium restriction and increased consumption of fresh fruit, vegetables, and low fat dairy, alcohol moderation, and smoking cessation.;Monitor prescription use compliance.    Expected Outcomes  Short Term: Continued assessment and intervention until BP is < 140/7m HG in hypertensive participants. < 130/831mHG in hypertensive participants with diabetes, heart failure or chronic kidney disease.;Long Term: Maintenance of blood pressure at goal levels.    Lipids  Yes    Intervention  Provide education and support for participant on nutrition & aerobic/resistive exercise along with prescribed medications to achieve LDL '70mg'$ , HDL >'40mg'$ .    Expected Outcomes  Short Term: Participant states understanding of desired cholesterol values and is compliant with medications prescribed. Participant is following exercise prescription and nutrition guidelines.;Long Term: Cholesterol controlled with medications as prescribed, with individualized exercise RX and with personalized nutrition plan. Value goals: LDL < '70mg'$ , HDL > 40 mg.       Core Components/Risk Factors/Patient Goals Review:    Core Components/Risk Factors/Patient Goals at Discharge (Final Review):    ITP Comments: ITP Comments    Row Name 05/16/18 1236 05/18/18 1356         ITP Comments  Med Review completed. Initial ITP created. [facility]  30 day review completed. ITP  sent to Dr. Emily Filbert, Medical Director of Cardiac Rehab. Continue with ITP unless changes are made by physician.         Comments: 30 day review

## 2018-05-26 ENCOUNTER — Encounter: Payer: Medicare PPO | Attending: Internal Medicine | Admitting: *Deleted

## 2018-05-26 DIAGNOSIS — Z951 Presence of aortocoronary bypass graft: Secondary | ICD-10-CM | POA: Diagnosis not present

## 2018-05-26 DIAGNOSIS — I214 Non-ST elevation (NSTEMI) myocardial infarction: Secondary | ICD-10-CM | POA: Diagnosis not present

## 2018-05-26 NOTE — Progress Notes (Signed)
Daily Session Note  Patient Details  Name: Timothy Goodwin MRN: [id number] Date of Birth: 1934-06-18 Referring Provider:     Cardiac Rehab from 05/16/2018 in [facility] Cardiac and Pulmonary Rehab  Referring Provider  Lujean Amel MD      Encounter Date: 05/26/2018  Check In: Session Check In - 05/26/18 0908      Check-In   Supervising physician immediately available to respond to emergencies  See telemetry face sheet for immediately available ER MD    Location  [facility]-Cardiac & Pulmonary Rehab    Staff Present  Jasper Loser BS, Exercise Physiologist;Carroll Enterkin, RN, BSN;Demetrious Rainford Luan Pulling, MA, RCEP, CCRP, Exercise Physiologist;Joseph Tessie Fass RCP,RRT,BSRT    Medication changes reported      No    Fall or balance concerns reported     No    Warm-up and Cool-down  Performed as group-led instruction    Resistance Training Performed  Yes    VAD Patient?  No    PAD/SET Patient?  No      Pain Assessment   Currently in Pain?  No/denies          Social History   Tobacco Use  Smoking Status Never Smoker  Smokeless Tobacco Never Used    Goals Met:  Exercise tolerated well Personal goals reviewed No report of cardiac concerns or symptoms Strength training completed today  Goals Unmet:  Not Applicable  Comments: First full day of exercise!  Patient was oriented to gym and equipment including functions, settings, policies, and procedures.  Patient's individual exercise prescription and treatment plan were reviewed.  All starting workloads were established based on the results of the 6 minute walk test done at initial orientation visit.  The plan for exercise progression was also introduced and progression will be customized based on patient's performance and goals.    Dr. Emily Filbert is Medical Director for [place] and [facility].

## 2018-05-31 ENCOUNTER — Encounter: Payer: Medicare PPO | Admitting: *Deleted

## 2018-05-31 DIAGNOSIS — Z951 Presence of aortocoronary bypass graft: Secondary | ICD-10-CM

## 2018-05-31 DIAGNOSIS — I214 Non-ST elevation (NSTEMI) myocardial infarction: Secondary | ICD-10-CM | POA: Diagnosis not present

## 2018-05-31 NOTE — Progress Notes (Signed)
Daily Session Note  Patient Details  Name: Timothy Goodwin MRN: [id number] Date of Birth: [birth year] Referring Provider:     Cardiac Rehab from 05/16/2018 in [facility] Cardiac and Pulmonary Rehab  Referring Provider  Lujean Amel MD      Encounter Date: 05/31/2018  Check In: Session Check In - 05/31/18 0916      Check-In   Supervising physician immediately available to respond to emergencies  See telemetry face sheet for immediately available ER MD    Location  [facility]-Cardiac & Pulmonary Rehab    Staff Present  Heath Lark, RN, BSN, CCRP;Jeanna Durrell BS, Exercise Physiologist;Londen Bok [place], MA, RCEP, CCRP, Exercise Physiologist    Medication changes reported      No    Fall or balance concerns reported     No    Warm-up and Cool-down  Performed as group-led instruction    Resistance Training Performed  Yes    VAD Patient?  No    PAD/SET Patient?  No      Pain Assessment   Currently in Pain?  No/denies          Social History   Tobacco Use  Smoking Status Never Smoker  Smokeless Tobacco Never Used    Goals Met:  Exercise tolerated well No report of cardiac concerns or symptoms Strength training completed today  Goals Unmet:  Not Applicable  Comments: Pt able to follow exercise prescription today without complaint.  Will continue to monitor for progression.    Dr. Emily Filbert is Medical Director for [place] and [facility].

## 2018-06-02 ENCOUNTER — Encounter: Payer: Medicare PPO | Admitting: *Deleted

## 2018-06-02 DIAGNOSIS — I214 Non-ST elevation (NSTEMI) myocardial infarction: Secondary | ICD-10-CM | POA: Diagnosis not present

## 2018-06-02 DIAGNOSIS — Z951 Presence of aortocoronary bypass graft: Secondary | ICD-10-CM

## 2018-06-02 NOTE — Progress Notes (Signed)
Daily Session Note  Patient Details  Name: Timothy Goodwin MRN: [id number] Date of Birth: 1934/05/16 Referring Provider:     Cardiac Rehab from 05/16/2018 in [facility] Cardiac and Pulmonary Rehab  Referring Provider  Lujean Amel MD      Encounter Date: 06/02/2018  Check In: Session Check In - 06/02/18 0923      Check-In   Supervising physician immediately available to respond to emergencies  See telemetry face sheet for immediately available ER MD    Location  [facility]-Cardiac & Pulmonary Rehab    Staff Present  Jasper Loser BS, Exercise Physiologist;Carroll Enterkin, RN, BSN;Virgil Lightner Luan Pulling, MA, RCEP, CCRP, Exercise Physiologist;Joseph Tessie Fass RCP,RRT,BSRT    Medication changes reported      No    Fall or balance concerns reported     No    Warm-up and Cool-down  Performed as group-led instruction    Resistance Training Performed  Yes    VAD Patient?  No    PAD/SET Patient?  No      Pain Assessment   Currently in Pain?  No/denies          Social History   Tobacco Use  Smoking Status Never Smoker  Smokeless Tobacco Never Used    Goals Met:  Independence with exercise equipment Exercise tolerated well No report of cardiac concerns or symptoms Strength training completed today  Goals Unmet:  Not Applicable  Comments: Pt able to follow exercise prescription today without complaint.  Will continue to monitor for progression.    Dr. Emily Filbert is Medical Director for [place] and [facility].

## 2018-06-07 DIAGNOSIS — I214 Non-ST elevation (NSTEMI) myocardial infarction: Secondary | ICD-10-CM | POA: Diagnosis not present

## 2018-06-07 DIAGNOSIS — Z951 Presence of aortocoronary bypass graft: Secondary | ICD-10-CM

## 2018-06-07 NOTE — Progress Notes (Signed)
Daily Session Note  Patient Details  Name: Timothy Goodwin MRN: [id number] Date of Birth: 03-17-1935 Referring Provider:     Cardiac Rehab from 05/16/2018 in [facility] Cardiac and Pulmonary Rehab  Referring Provider  Lujean Amel MD      Encounter Date: 06/07/2018  Check In: Session Check In - 06/07/18 1200      Check-In   Supervising physician immediately available to respond to emergencies  See telemetry face sheet for immediately available ER MD    Location  [facility]-Cardiac & Pulmonary Rehab    Staff Present  Heath Lark, RN, BSN, CCRP;Jeanna Durrell BS, Exercise Physiologist;Amanda Sommer, BA, ACSM CEP, Exercise Physiologist    Medication changes reported      No    Fall or balance concerns reported     No    Tobacco Cessation  No Change    Warm-up and Cool-down  Performed as group-led instruction    Resistance Training Performed  Yes    VAD Patient?  No    PAD/SET Patient?  No      Pain Assessment   Currently in Pain?  No/denies    Multiple Pain Sites  No          Social History   Tobacco Use  Smoking Status Never Smoker  Smokeless Tobacco Never Used    Goals Met:  Independence with exercise equipment Exercise tolerated well No report of cardiac concerns or symptoms Strength training completed today  Goals Unmet:  Not Applicable  Comments: Pt able to follow exercise prescription today without complaint.  Will continue to monitor for progression.    Dr. Emily Filbert is Medical Director for [place] and [facility].

## 2018-06-09 DIAGNOSIS — I214 Non-ST elevation (NSTEMI) myocardial infarction: Secondary | ICD-10-CM | POA: Diagnosis not present

## 2018-06-09 DIAGNOSIS — Z951 Presence of aortocoronary bypass graft: Secondary | ICD-10-CM

## 2018-06-09 NOTE — Progress Notes (Signed)
Daily Session Note  Patient Details  Name: Timothy Goodwin MRN: [id number] Date of Birth: Jun 14, 1934 Referring Provider:     Cardiac Rehab from 05/16/2018 in [facility] Cardiac and Pulmonary Rehab  Referring Provider  Lujean Amel MD      Encounter Date: 06/09/2018  Check In: Session Check In - 06/09/18 0910      Check-In   Supervising physician immediately available to respond to emergencies  See telemetry face sheet for immediately available ER MD    Location  [facility]-Cardiac & Pulmonary Rehab    Staff Present  Gerlene Burdock, RN, BSN;Kaleah Hagemeister BS, Exercise Physiologist;Jessica Plaza, MA, RCEP, CCRP, Exercise Physiologist    Medication changes reported      No    Fall or balance concerns reported     No    Tobacco Cessation  No Change    Warm-up and Cool-down  Performed as group-led instruction    Resistance Training Performed  Yes    VAD Patient?  No    PAD/SET Patient?  No      Pain Assessment   Currently in Pain?  No/denies          Social History   Tobacco Use  Smoking Status Never Smoker  Smokeless Tobacco Never Used    Goals Met:  Independence with exercise equipment Exercise tolerated well Personal goals reviewed No report of cardiac concerns or symptoms Strength training completed today  Goals Unmet:  Not Applicable  Comments: Pt able to follow exercise prescription today without complaint.  Will continue to monitor for progression.  Reviewed home exercise with pt today.  Pt plans to use treadmill and gym at [facility] for exercise.  Reviewed THR, pulse, RPE, sign and symptoms, NTG use, and when to call 911 or MD.  Also discussed weather considerations and indoor options.  Pt voiced understanding.   Dr. Emily Filbert is Medical Director for [place] and [facility].

## 2018-06-14 DIAGNOSIS — I214 Non-ST elevation (NSTEMI) myocardial infarction: Secondary | ICD-10-CM | POA: Diagnosis not present

## 2018-06-14 DIAGNOSIS — Z951 Presence of aortocoronary bypass graft: Secondary | ICD-10-CM

## 2018-06-14 NOTE — Progress Notes (Signed)
Daily Session Note  Patient Details  Name: Timothy Goodwin MRN: [id number] Date of Birth: Feb 22, 1935 Referring Provider:     Cardiac Rehab from 05/16/2018 in [facility] Cardiac and Pulmonary Rehab  Referring Provider  Lujean Amel MD      Encounter Date: 06/14/2018  Check In: Session Check In - 06/14/18 9163      Check-In   Supervising physician immediately available to respond to emergencies  See telemetry face sheet for immediately available ER MD    Location  [facility]-Cardiac & Pulmonary Rehab    Staff Present  Heath Lark, RN, BSN, CCRP;Berdie Malter Darrin Nipper, [place], [place], [place], Exercise Physiologist    Medication changes reported      No    Fall or balance concerns reported     No    Warm-up and Cool-down  Performed as group-led instruction    Resistance Training Performed  Yes    VAD Patient?  No    PAD/SET Patient?  No      Pain Assessment   Currently in Pain?  No/denies          Social History   Tobacco Use  Smoking Status Never Smoker  Smokeless Tobacco Never Used    Goals Met:  Independence with exercise equipment Exercise tolerated well No report of cardiac concerns or symptoms Strength training completed today  Goals Unmet:  Not Applicable  Comments: Pt able to follow exercise prescription today without complaint.  Will continue to monitor for progression.    Dr. Emily Filbert is Medical Director for [place] and [facility].

## 2018-06-15 ENCOUNTER — Encounter: Payer: Self-pay | Admitting: *Deleted

## 2018-06-15 DIAGNOSIS — Z951 Presence of aortocoronary bypass graft: Secondary | ICD-10-CM

## 2018-06-15 NOTE — Progress Notes (Signed)
Cardiac Individual Treatment Plan  Patient Details  Name: Timothy Goodwin MRN: [id number] Date of Birth: [birth year] Referring Provider:     Cardiac Rehab from 05/16/2018 in [facility] Cardiac and Pulmonary Rehab  Referring Provider  Lujean Amel MD      Initial Encounter Date:    Cardiac Rehab from 05/16/2018 in [facility] Cardiac and Pulmonary Rehab  Date  05/16/18      Visit Diagnosis: S/P CABG x 2  Patient's Home Medications on Admission:  Current Outpatient Medications:  .  allopurinol (ZYLOPRIM) 100 MG tablet, TAKE 1 TABLET DAILY, Disp: , Rfl:  .  amiodarone (PACERONE) 200 MG tablet, , Disp: , Rfl:  .  amLODipine (NORVASC) 10 MG tablet, Take 10 mg by mouth daily. , Disp: , Rfl:  .  atorvastatin (LIPITOR) 40 MG tablet, , Disp: , Rfl:  .  calcium carbonate 1250 MG capsule, Take 1,250 mg by mouth daily. , Disp: , Rfl:  .  cloNIDine (CATAPRES) 0.1 MG tablet, Take 0.1 mg by mouth 2 (two) times daily. , Disp: , Rfl:  .  clopidogrel (PLAVIX) 75 MG tablet, Take by mouth., Disp: , Rfl:  .  furosemide (LASIX) 20 MG tablet, , Disp: , Rfl:  .  heparin 25000-0.45 UT/250ML-% infusion, Inject 1,000 Units/hr into the vein continuous. (Patient not taking: Reported on 05/16/2018), Disp: 10 mL, Rfl: 0 .  isosorbide mononitrate (IMDUR) 60 MG 24 hr tablet, Take 1 tablet (60 mg total) by mouth daily. (Patient not taking: Reported on 05/16/2018), Disp: 30 tablet, Rfl: 0 .  magnesium 30 MG tablet, Take 30 mg by mouth 2 (two) times daily., Disp: , Rfl:  .  metoprolol tartrate (LOPRESSOR) 25 MG tablet, Take 25 mg by mouth 2 (two) times daily. , Disp: , Rfl:  .  Multiple Vitamin (MULTIVITAMIN) capsule, Take 1 capsule by mouth daily., Disp: , Rfl:  .  pantoprazole (PROTONIX) 40 MG tablet, , Disp: , Rfl:  .  potassium chloride (MICRO-K) 10 MEQ CR capsule, , Disp: , Rfl:  .  TURMERIC PO, Take by mouth., Disp: , Rfl:  .  vitamin C (ASCORBIC ACID) 500 MG tablet, Take by mouth., Disp: , Rfl:  .  Vitamin D,  Ergocalciferol, (DRISDOL) 50000 units CAPS capsule, , Disp: , Rfl:  .  vitamin E 200 UNIT capsule, Take by mouth., Disp: , Rfl:   Past Medical History: Past Medical History:  Diagnosis Date  . Hypertension     Tobacco Use: Social History   Tobacco Use  Smoking Status Never Smoker  Smokeless Tobacco Never Used    Labs: Recent Review Scientist, physiological    Labs for ITP Cardiac and Pulmonary Rehab Latest Ref Rng & Units 12/25/2013   Cholestrol 0 - 200 mg/dL 261(H)   LDLCALC 0 - 100 mg/dL 178(H)   HDL 40 - 60 mg/dL 39(L)   Trlycerides 0 - 200 mg/dL 221(H)       Exercise Target Goals: Exercise Program Goal: Individual exercise prescription set using results from initial 6 min walk test and THRR while considering  patient's activity barriers and safety.   Exercise Prescription Goal: Initial exercise prescription builds to 30-45 minutes a day of aerobic activity, 2-3 days per week.  Home exercise guidelines will be given to patient during program as part of exercise prescription that the participant will acknowledge.  Activity Barriers & Risk Stratification: Activity Barriers & Cardiac Risk Stratification - 05/16/18 1307      Activity Barriers & Cardiac Risk Stratification   Activity Barriers  None    Cardiac Risk Stratification  High       6 Minute Walk: 6 Minute Walk    Row Name 05/16/18 1528         6 Minute Walk   Phase  Initial     Distance  1480 feet     Walk Time  6 minutes     # of Rest Breaks  0     MPH  2.8     METS  2.85     RPE  13     VO2 Peak  9.96     Symptoms  No     Resting HR  74 bpm     Resting BP  146/74     Resting Oxygen Saturation   99 %     Exercise Oxygen Saturation  during 6 min walk  100 %     Max Ex. HR  98 bpm     Max Ex. BP  156/64     2 Minute Post BP  146/72        Oxygen Initial Assessment:   Oxygen Re-Evaluation:   Oxygen Discharge (Final Oxygen Re-Evaluation):   Initial Exercise Prescription: Initial Exercise  Prescription - 05/16/18 1500      Date of Initial Exercise RX and Referring Provider   Date  05/16/18    Referring Provider  Lujean Amel MD      Treadmill   MPH  2.4    Grade  0.5    Minutes  15    METs  2.84      REL-XR   Level  1    Speed  50    Minutes  15    METs  2.8      T5 Nustep   Level  1    SPM  80    Minutes  15    METs  2.5      Prescription Details   Frequency (times per week)  2    Duration  Progress to 30 minutes of continuous aerobic without signs/symptoms of physical distress      Intensity   THRR 40-80% of Max Heartrate  100-126    Ratings of Perceived Exertion  11-13    Perceived Dyspnea  0-4      Progression   Progression  Continue to progress workloads to maintain intensity without signs/symptoms of physical distress.      Resistance Training   Training Prescription  Yes    Weight  3 lbs    Reps  10-15       Perform Capillary Blood Glucose checks as needed.  Exercise Prescription Changes: Exercise Prescription Changes    Row Name 05/16/18 1500 06/08/18 1600 06/09/18 1100         Response to Exercise   Blood Pressure (Admit)  146/74  120/68  -     Blood Pressure (Exercise)  156/64  140/68  -     Blood Pressure (Exit)  146/72  130/68  -     Heart Rate (Admit)  74 bpm  79 bpm  -     Heart Rate (Exercise)  98 bpm  115 bpm  -     Heart Rate (Exit)  89 bpm  64 bpm  -     Oxygen Saturation (Admit)  99 %  -  -     Oxygen Saturation (Exercise)  100 %  -  -     Rating of Perceived Exertion (Exercise)  13  12  -     Symptoms  none  none  -     Comments  walk test results  -  -     Duration  -  Continue with 30 min of aerobic exercise without signs/symptoms of physical distress.  -     Intensity  -  THRR unchanged  -       Progression   Progression  -  Continue to progress workloads to maintain intensity without signs/symptoms of physical distress.  -     Average METs  -  3.03  -       Resistance Training   Training Prescription  -   Yes  -     Weight  -  3 lbs  -     Reps  -  10-15  -       Interval Training   Interval Training  -  No  -       Treadmill   MPH  -  2.4  -     Grade  -  0.5  -     Minutes  -  15  -     METs  -  3  -       REL-XR   Level  -  2  -     Minutes  -  15  -     METs  -  3.7  -       T5 Nustep   Level  -  2  -     Minutes  -  15  -     METs  -  2.4  -       Home Exercise Plan   Plans to continue exercise at  -  -  [employer] (comment) [employer]  -  -  Add 3 additional days to program exercise sessions.     Initial Home Exercises Provided  -  -  06/09/18        Exercise Comments: Exercise Comments    Row Name 05/26/18 0909           Exercise Comments  First full day of exercise!  Patient was oriented to gym and equipment including functions, settings, policies, and procedures.  Patient's individual exercise prescription and treatment plan were reviewed.  All starting workloads were established based on the results of the 6 minute walk test done at initial orientation visit.  The plan for exercise progression was also introduced and progression will be customized based on patient's performance and goals.          Exercise Goals and Review: Exercise Goals    Row Name 05/16/18 1532             Exercise Goals   Increase Physical Activity  Yes       Intervention  Provide advice, education, support and counseling about physical activity/exercise needs.;Develop an individualized exercise prescription for aerobic and resistive training based on initial evaluation findings, risk stratification, comorbidities and participant's personal goals.       Expected Outcomes  Short Term: Attend rehab on a regular basis to increase amount of physical activity.;Long Term: Add in home exercise to make exercise part of routine and to increase amount of physical activity.;Long Term: Exercising regularly at least 3-5 days a week.       Increase Strength and Stamina   Yes       Intervention  Develop an individualized exercise prescription for aerobic and resistive training based on initial evaluation findings, risk stratification, comorbidities and participant's personal goals.;Provide advice, education, support and counseling about physical activity/exercise needs.       Expected Outcomes  Short Term: Increase workloads from initial exercise prescription for resistance, speed, and METs.;Short Term: Perform resistance training exercises routinely during rehab and add in resistance training at home;Long Term: Improve cardiorespiratory fitness, muscular endurance and strength as measured by increased METs and functional capacity (6MWT)       Able to understand and use rate of perceived exertion (RPE) scale  Yes       Intervention  Provide education and explanation on how to use RPE scale       Expected Outcomes  Short Term: Able to use RPE daily in rehab to express subjective intensity level;Long Term:  Able to use RPE to guide intensity level when exercising independently       Knowledge and understanding of Target Heart Rate Range (THRR)  Yes       Intervention  Provide education and explanation of THRR including how the numbers were predicted and where they are located for reference       Expected Outcomes  Short Term: Able to state/look up THRR;Short Term: Able to use daily as guideline for intensity in rehab;Long Term: Able to use THRR to govern intensity when exercising independently       Able to check pulse independently  Yes       Intervention  Provide education and demonstration on how to check pulse in carotid and radial arteries.;Review the importance of being able to check your own pulse for safety during independent exercise       Expected Outcomes  Long Term: Able to check pulse independently and accurately;Short Term: Able to explain why pulse checking is important during independent exercise       Understanding of Exercise Prescription  Yes        Intervention  Provide education, explanation, and written materials on patient's individual exercise prescription       Expected Outcomes  Short Term: Able to explain program exercise prescription;Long Term: Able to explain home exercise prescription to exercise independently          Exercise Goals Re-Evaluation : Exercise Goals Re-Evaluation    Row Name 05/26/18 9741 06/08/18 1614 06/09/18 1112         Exercise Goal Re-Evaluation   Exercise Goals Review  Increase Physical Activity;Increase Strength and Stamina;Able to understand and use rate of perceived exertion (RPE) scale;Knowledge and understanding of Target Heart Rate Range (THRR);Understanding of Exercise Prescription  Increase Physical Activity;Increase Strength and Stamina;Understanding of Exercise Prescription  Increase Physical Activity;Increase Strength and Stamina;Understanding of Exercise Prescription;Able to understand and use rate of perceived exertion (RPE) scale;Knowledge and understanding of Target Heart Rate Range (THRR);Able to check pulse independently     Comments  Reviewed RPE scale, THR and program prescription with pt today.  Pt voiced understanding and was given a copy of goals to take home.   Timothy Goodwin is off to a good start in rehab.  He is already up level 2 on the T5 Nustep.  We review home exercise guidelines soon and continue to monitor his progress.   Reviewed home exercise with pt today.  Pt plans to use treadmill and gym at [facility] for exercise.  Reviewed THR, pulse, RPE, sign and symptoms, NTG use, and when to call 911 or MD.  Also discussed weather considerations  and indoor options.  Pt voiced understanding.     Expected Outcomes  Short: Use RPE daily to regulate intensity. Long: Follow program prescription in THR.  Short: Review home exercise guidelines and increase workloads.  Long: Continue to increase strength and stamina.   Short: Continue to use treadmill on off days.  Long: Continue to build up to 30 min of  exercise daily.         Discharge Exercise Prescription (Final Exercise Prescription Changes): Exercise Prescription Changes - 06/09/18 1100      Home Exercise Plan   Plans to continue exercise at  [facility] (comment)   [place] [facility]   Frequency  Add 3 additional days to program exercise sessions.    Initial Home Exercises Provided  06/09/18       Nutrition:  Target Goals: Understanding of nutrition guidelines, daily intake of sodium <1555m, cholesterol <2073m calories 30% from fat and 7% or less from saturated fats, daily to have 5 or more servings of fruits and vegetables.  Biometrics: Pre Biometrics - 05/16/18 1532      Pre Biometrics   Height  5' 5.1" (1.654 m)    Weight  154 lb (69.9 kg)    Waist Circumference  37 inches    Hip Circumference  36.5 inches    Waist to Hip Ratio  1.01 %    BMI (Calculated)  25.53    Single Leg Stand  18.1 seconds        Nutrition Therapy Plan and Nutrition Goals: Nutrition Therapy & Goals - 05/16/18 1305      Intervention Plan   Intervention  Prescribe, educate and counsel regarding individualized specific dietary modifications aiming towards targeted core components such as weight, hypertension, lipid management, diabetes, heart failure and other comorbidities.;Nutrition handout(s) given to patient.    Expected Outcomes  Short Term Goal: Understand basic principles of dietary content, such as calories, fat, sodium, cholesterol and nutrients.;Short Term Goal: A plan has been developed with personal nutrition goals set during dietitian appointment.;Long Term Goal: Adherence to prescribed nutrition plan.       Nutrition Assessments: Nutrition Assessments - 05/16/18 1306      MEDFICTS Scores   Pre Score  37       Nutrition Goals Re-Evaluation: Nutrition Goals Re-Evaluation    Row Name 06/09/18 1112             Goals   Nutrition Goal  Heart Healthy diet, low sodium       Comment  Timothy Goodwin been doing well with his  diet.  [place] watches his diet closely.  They eat a lot of fruit and vegetables.  He has been trying to eat more chicken and fish for lean meats.  He Is watching his salt intake too.  We will conitnue to monitor his diet.        Expected Outcome  Short: Continue to eat healthy.  Long: Continue to watch sodium.           Nutrition Goals Discharge (Final Nutrition Goals Re-Evaluation): Nutrition Goals Re-Evaluation - 06/09/18 1112      Goals   Nutrition Goal  Heart Healthy diet, low sodium    Comment  Timothy Goodwin been doing well with his diet.  [place] watches his diet closely.  They eat a lot of fruit and vegetables.  He has been trying to eat more chicken and fish for lean  meats.  He Is watching his salt intake too.  We will conitnue to monitor his diet.     Expected Outcome  Short: Continue to eat healthy.  Long: Continue to watch sodium.        Psychosocial: Target Goals: Acknowledge presence or absence of significant depression and/or stress, maximize coping skills, provide positive support system. Participant is able to verbalize types and ability to use techniques and skills needed for reducing stress and depression.   Initial Review & Psychosocial Screening: Initial Psych Review & Screening - 05/16/18 1304      Initial Review   Current issues with  None Identified      Family Dynamics   Good Support System?  Yes   spouse and adult children   Comments  Timothy Goodwin and his wife have lived in [place] for about the last 6 years. They love it and enjoy the community setting.       Barriers   Psychosocial barriers to participate in program  The patient should benefit from training in stress management and relaxation.;There are no identifiable barriers or psychosocial needs.      Screening Interventions   Interventions  Encouraged to exercise;Program counselor consult;To provide support and resources with identified psychosocial  needs;Provide feedback about the scores to participant    Expected Outcomes  Short Term goal: Utilizing psychosocial counselor, staff and physician to assist with identification of specific Stressors or current issues interfering with healing process. Setting desired goal for each stressor or current issue identified.;Long Term Goal: Stressors or current issues are controlled or eliminated.;Short Term goal: Identification and review with participant of any Quality of Life or Depression concerns found by scoring the questionnaire.;Long Term goal: The participant improves quality of Life and PHQ9 Scores as seen by post scores and/or verbalization of changes       Quality of Life Scores:  Quality of Life - 05/16/18 1305      Quality of Life   Select  Quality of Life      Quality of Life Scores   Health/Function Pre  26.25 %    Socioeconomic Pre  27.5 %    Psych/Spiritual Pre  26.79 %    Family Pre  25.9 %    GLOBAL Pre  24.48 %      Scores of 19 and below usually indicate a poorer quality of life in these areas.  A difference of  2-3 points is a clinically meaningful difference.  A difference of 2-3 points in the total score of the Quality of Life Index has been associated with significant improvement in overall quality of life, self-image, physical symptoms, and general health in studies assessing change in quality of life.  PHQ-9: Recent Review Flowsheet Data    Depression screen [facility] 2/9 05/16/2018   Decreased Interest 0   Down, Depressed, Hopeless 0   PHQ - 2 Score 0   Altered sleeping 0   Tired, decreased energy 0   Change in appetite 0   Feeling bad or failure about yourself  0   Trouble concentrating 0   Moving slowly or fidgety/restless 0   Suicidal thoughts 0   PHQ-9 Score 0     Interpretation of Total Score  Total Score Depression Severity:  1-4 = Minimal depression, 5-9 = Mild depression, 10-14 = Moderate depression, 15-19 = Moderately severe depression, 20-27 = Severe  depression   Psychosocial Evaluation and Intervention: Psychosocial Evaluation - 06/02/18 1046      Psychosocial Evaluation &  Interventions   Interventions  Encouraged to exercise with the program and follow exercise prescription    Comments  Counselor met with Timothy Goodwin) today for initial psychosocial evaluation.  He is an 84 year old who had a CABG x2 late December, 2019.  He has a strong support system with a spouse; (4) adult children and lives in a retirement community.  He has had prostate cancer surgery 20+_ years ago and reports Drs are keeping an "eye on his numbers."  He sleeps well and has a good appetite.  Timothy Goodwin denies a history of depression or anxiety or any current symptoms and is typically in a positive mood.  He has goals to increase his stamina and strength and improve his muscle mass.  He will be followed by staff.      Expected Outcomes  Short:  Timothy Goodwin will exercise with the program to increase his stamina and strength and recover his muscle mass.  Long:  Aime will continue to exercise to maintain his progress in this program.       Psychosocial Re-Evaluation:   Psychosocial Discharge (Final Psychosocial Re-Evaluation):   Vocational Rehabilitation: Provide vocational rehab assistance to qualifying candidates.   Vocational Rehab Evaluation & Intervention: Vocational Rehab - 05/16/18 1304      Initial Vocational Rehab Evaluation & Intervention   Assessment shows need for Vocational Rehabilitation  No       Education: Education Goals: Education classes will be provided on a variety of topics geared toward better understanding of heart health and risk factor modification. Participant will state understanding/return demonstration of topics presented as noted by education test scores.  Learning Barriers/Preferences: Learning Barriers/Preferences - 05/16/18 1257      Learning Barriers/Preferences   Learning Barriers  None    Learning Preferences  None        Education Topics:  AED/CPR: - Group verbal and written instruction with the use of models to demonstrate the basic use of the AED with the basic ABC's of resuscitation.   Cardiac Rehab from 06/14/2018 in [facility] Cardiac and Pulmonary Rehab  Date  06/14/18  Educator  SB  Instruction Review Code  1- Verbalizes Understanding      General Nutrition Guidelines/Fats and Fiber: -Group instruction provided by verbal, written material, models and posters to present the general guidelines for heart healthy nutrition. Gives an explanation and review of dietary fats and fiber.   Controlling Sodium/Reading Food Labels: -Group verbal and written material supporting the discussion of sodium use in heart healthy nutrition. Review and explanation with models, verbal and written materials for utilization of the food label.   Exercise Physiology & General Exercise Guidelines: - Group verbal and written instruction with models to review the exercise physiology of the cardiovascular system and associated critical values. Provides general exercise guidelines with specific guidelines to those with heart or lung disease.    Aerobic Exercise & Resistance Training: - Gives group verbal and written instruction on the various components of exercise. Focuses on aerobic and resistive training programs and the benefits of this training and how to safely progress through these programs..   Flexibility, Balance, Mind/Body Relaxation: Provides group verbal/written instruction on the benefits of flexibility and balance training, including mind/body exercise modes such as yoga, pilates and tai chi.  Demonstration and skill practice provided.   Stress and Anxiety: - Provides group verbal and written instruction about the health risks of elevated stress and causes of high stress.  Discuss the correlation between heart/lung disease and  anxiety and treatment options. Review healthy ways to manage with stress and  anxiety.   Cardiac Rehab from 06/14/2018 in [facility] Cardiac and Pulmonary Rehab  Date  06/07/18  Educator  [facility]  Instruction Review Code  1- Verbalizes Understanding      Depression: - Provides group verbal and written instruction on the correlation between heart/lung disease and depressed mood, treatment options, and the stigmas associated with seeking treatment.   Anatomy & Physiology of the Heart: - Group verbal and written instruction and models provide basic cardiac anatomy and physiology, with the coronary electrical and arterial systems. Review of Valvular disease and Heart Failure   Cardiac Rehab from 06/14/2018 in [facility] Cardiac and Pulmonary Rehab  Date  05/26/18  Educator  CE  Instruction Review Code  1- Verbalizes Understanding      Cardiac Procedures: - Group verbal and written instruction to review commonly prescribed medications for heart disease. Reviews the medication, class of the drug, and side effects. Includes the steps to properly store meds and maintain the prescription regimen. (beta blockers and nitrates)   Cardiac Rehab from 06/14/2018 in [facility] Cardiac and Pulmonary Rehab  Date  06/09/18  Educator  CE  Instruction Review Code  1- Verbalizes Understanding      Cardiac Medications I: - Group verbal and written instruction to review commonly prescribed medications for heart disease. Reviews the medication, class of the drug, and side effects. Includes the steps to properly store meds and maintain the prescription regimen.   Cardiac Rehab from 06/14/2018 in [facility] Cardiac and Pulmonary Rehab  Date  05/31/18  Educator  SB  Instruction Review Code  1- Verbalizes Understanding      Cardiac Medications II: -Group verbal and written instruction to review commonly prescribed medications for heart disease. Reviews the medication, class of the drug, and side effects. (all other drug classes)    Go Sex-Intimacy & Heart Disease, Get SMART - Goal Setting: - Group verbal and  written instruction through game format to discuss heart disease and the return to sexual intimacy. Provides group verbal and written material to discuss and apply goal setting through the application of the S.M.A.R.T. Method.   Cardiac Rehab from 06/14/2018 in [facility] Cardiac and Pulmonary Rehab  Date  06/09/18  Educator  CE  Instruction Review Code  1- Verbalizes Understanding      Other Matters of the Heart: - Provides group verbal, written materials and models to describe Stable Angina and Peripheral Artery. Includes description of the disease process and treatment options available to the cardiac patient.   Cardiac Rehab from 06/14/2018 in [facility] Cardiac and Pulmonary Rehab  Date  05/26/18  Educator  CE  Instruction Review Code  1- Verbalizes Understanding      Exercise & Equipment Safety: - Individual verbal instruction and demonstration of equipment use and safety with use of the equipment.   Cardiac Rehab from 06/14/2018 in [facility] Cardiac and Pulmonary Rehab  Date  05/16/18  Educator  [facility]  Instruction Review Code  1- Verbalizes Understanding      Infection Prevention: - Provides verbal and written material to individual with discussion of infection control including proper hand washing and proper equipment cleaning during exercise session.   Cardiac Rehab from 06/14/2018 in [facility] Cardiac and Pulmonary Rehab  Date  05/16/18  Educator  [name]  Instruction Review Code  1- Verbalizes Understanding      Falls Prevention: - Provides verbal and written material to individual with discussion of falls prevention and safety.  Cardiac Rehab from 06/14/2018 in [facility] Cardiac and Pulmonary Rehab  Date  05/16/18  Educator  [facility]  Instruction Review Code  1- Verbalizes Understanding      Diabetes: - Individual verbal and written instruction to review signs/symptoms of diabetes, desired ranges of glucose level fasting, after meals and with exercise. Acknowledge that pre and post exercise glucose  checks will be done for 3 sessions at entry of program.   Know Your Numbers and Risk Factors: -Group verbal and written instruction about important numbers in your health.  Discussion of what are risk factors and how they play a role in the disease process.  Review of Cholesterol, Blood Pressure, Diabetes, and BMI and the role they play in your overall health.   Sleep Hygiene: -Provides group verbal and written instruction about how sleep can affect your health.  Define sleep hygiene, discuss sleep cycles and impact of sleep habits. Review good sleep hygiene tips.    Other: -Provides group and verbal instruction on various topics (see comments)   Knowledge Questionnaire Score: Knowledge Questionnaire Score - 05/16/18 1257      Knowledge Questionnaire Score   Pre Score  24/26   correct answers reviewed with Timothy Goodwin. Focus on Nutrition      Core Components/Risk Factors/Patient Goals at Admission: Personal Goals and Risk Factors at Admission - 05/16/18 1255      Core Components/Risk Factors/Patient Goals on Admission    Weight Management  Yes;Weight Gain    Intervention  Weight Management: Develop a combined nutrition and exercise program designed to reach desired caloric intake, while maintaining appropriate intake of nutrient and fiber, sodium and fats, and appropriate energy expenditure required for the weight goal.;Weight Management: Provide education and appropriate resources to help participant work on and attain dietary goals.    Admit Weight  154 lb (69.9 kg)    Goal Weight: Short Term  160 lb (72.6 kg)    Expected Outcomes  Short Term: Continue to assess and modify interventions until short term weight is achieved;Long Term: Adherence to nutrition and physical activity/exercise program aimed toward attainment of established weight goal;Weight Maintenance: Understanding of the daily nutrition guidelines, which includes 25-35% calories from fat, 7% or less cal from saturated fats,  less than 246m cholesterol, less than 1.5gm of sodium, & 5 or more servings of fruits and vegetables daily;Understanding recommendations for meals to include 15-35% energy as protein, 25-35% energy from fat, 35-60% energy from carbohydrates, less than 2078mof dietary cholesterol, 20-35 gm of total fiber daily;Understanding of distribution of calorie intake throughout the day with the consumption of 4-5 meals/snacks;Weight Gain: Understanding of general recommendations for a high calorie, high protein meal plan that promotes weight gain by distributing calorie intake throughout the day with the consumption for 4-5 meals, snacks, and/or supplements    Hypertension  Yes    Intervention  Provide education on lifestyle modifcations including regular physical activity/exercise, weight management, moderate sodium restriction and increased consumption of fresh fruit, vegetables, and low fat dairy, alcohol moderation, and smoking cessation.;Monitor prescription use compliance.    Expected Outcomes  Short Term: Continued assessment and intervention until BP is < 140/9078mG in hypertensive participants. < 130/14m18m in hypertensive participants with diabetes, heart failure or chronic kidney disease.;Long Term: Maintenance of blood pressure at goal levels.    Lipids  Yes    Intervention  Provide education and support for participant on nutrition & aerobic/resistive exercise along with prescribed medications to achieve LDL <70mg44mL >40mg.26m  Expected Outcomes  Short Term: Participant states understanding of desired cholesterol values and is compliant with medications prescribed. Participant is following exercise prescription and nutrition guidelines.;Long Term: Cholesterol controlled with medications as prescribed, with individualized exercise RX and with personalized nutrition plan. Value goals: LDL < 58m, HDL > 40 mg.       Core Components/Risk Factors/Patient Goals Review:  Goals and Risk Factor Review     Row Name 06/09/18 1116             Core Components/Risk Factors/Patient Goals Review   Personal Goals Review  Weight Management/Obesity;Hypertension;Lipids       Review  Timothy Goodwin been able to gain some weight back which has made him happy.  He is doing well with his blood pressures and checks them routinely at home.  He normally gets around 120-130s/70s.  He is doing well on his medications and sees his renal doctor tomorrow about those medications.        Expected Outcomes  Short: Talk to doctor about renal meds tomorrow.  Long: Continue to maintain weight now.           Core Components/Risk Factors/Patient Goals at Discharge (Final Review):  Goals and Risk Factor Review - 06/09/18 1116      Core Components/Risk Factors/Patient Goals Review   Personal Goals Review  Weight Management/Obesity;Hypertension;Lipids    Review  Timothy Goodwin been able to gain some weight back which has made him happy.  He is doing well with his blood pressures and checks them routinely at home.  He normally gets around 120-130s/70s.  He is doing well on his medications and sees his renal doctor tomorrow about those medications.     Expected Outcomes  Short: Talk to doctor about renal meds tomorrow.  Long: Continue to maintain weight now.        ITP Comments: ITP Comments    Row Name 05/16/18 1236 05/18/18 1356 06/15/18 0616       ITP Comments  Med Review completed. Initial ITP created. [facility]  30 day review completed. ITP sent to Dr. MEmily Filbert Medical Director of Cardiac Rehab. Continue with ITP unless changes are made by physician.  30 day review. Continue with ITP unless directed changes by Medical Director chart review.        Comments:

## 2018-06-16 DIAGNOSIS — I214 Non-ST elevation (NSTEMI) myocardial infarction: Secondary | ICD-10-CM | POA: Diagnosis not present

## 2018-06-16 DIAGNOSIS — Z951 Presence of aortocoronary bypass graft: Secondary | ICD-10-CM

## 2018-06-16 NOTE — Progress Notes (Signed)
Daily Session Note  Patient Details  Name: Timothy Goodwin MRN: [id number] Date of Birth: 1934/06/01 Referring Provider:     Cardiac Rehab from 05/16/2018 in [facility] Cardiac and Pulmonary Rehab  Referring Provider  Lujean Amel MD      Encounter Date: 06/16/2018  Check In: Session Check In - 06/16/18 0924      Check-In   Supervising physician immediately available to respond to emergencies  See telemetry face sheet for immediately available ER MD    Location  [facility]-Cardiac & Pulmonary Rehab    Staff Present  Jasper Loser BS, Exercise Physiologist;Carroll Enterkin, RN, BSN;Jessica Luan [place], MA, RCEP, CCRP, Exercise Physiologist;Joseph Tessie Fass RCP,RRT,BSRT    Medication changes reported      No    Fall or balance concerns reported     No    Warm-up and Cool-down  Performed as group-led instruction    Resistance Training Performed  Yes    VAD Patient?  No      Pain Assessment   Currently in Pain?  No/denies          Social History   Tobacco Use  Smoking Status Never Smoker  Smokeless Tobacco Never Used    Goals Met:  Independence with exercise equipment Exercise tolerated well No report of cardiac concerns or symptoms Strength training completed today  Goals Unmet:  Not Applicable  Comments: Pt able to follow exercise prescription today without complaint.  Will continue to monitor for progression.    Dr. Emily Filbert is Medical Director for [facility] [facility].

## 2018-06-21 ENCOUNTER — Encounter: Payer: Medicare PPO | Attending: Internal Medicine

## 2018-06-21 DIAGNOSIS — Z951 Presence of aortocoronary bypass graft: Secondary | ICD-10-CM | POA: Insufficient documentation

## 2018-06-21 DIAGNOSIS — I214 Non-ST elevation (NSTEMI) myocardial infarction: Secondary | ICD-10-CM | POA: Insufficient documentation

## 2018-06-23 DIAGNOSIS — I214 Non-ST elevation (NSTEMI) myocardial infarction: Secondary | ICD-10-CM | POA: Diagnosis not present

## 2018-06-23 DIAGNOSIS — Z951 Presence of aortocoronary bypass graft: Secondary | ICD-10-CM | POA: Diagnosis not present

## 2018-06-23 NOTE — Progress Notes (Signed)
Daily Session Note  Patient Details  Name: Melquan Ernsberger MRN: [id number] Date of Birth: [birth year] Referring Provider:     Cardiac Rehab from 05/16/2018 in [facility] Cardiac and Pulmonary Rehab  Referring Provider  Lujean Amel MD      Encounter Date: 06/23/2018  Check In: Session Check In - 06/23/18 0913      Check-In   Supervising physician immediately available to respond to emergencies  See telemetry face sheet for immediately available ER MD    Location  [facility]-Cardiac & Pulmonary Rehab    Staff Present  Alberteen Sam, MA, RCEP, CCRP, Exercise Physiologist;Jaylissa Felty RCP,RRT,BSRT;Carroll Enterkin, RN, BSN;Jeanna Durrell BS, Exercise Physiologist    Medication changes reported      No    Fall or balance concerns reported     No    Warm-up and Cool-down  Performed as group-led Higher education careers adviser Performed  Yes    VAD Patient?  No    PAD/SET Patient?  No      Pain Assessment   Currently in Pain?  No/denies          Social History   Tobacco Use  Smoking Status Never Smoker  Smokeless Tobacco Never Used    Goals Met:  Independence with exercise equipment Exercise tolerated well No report of cardiac concerns or symptoms Strength training completed today  Goals Unmet:  Not Applicable  Comments: Pt able to follow exercise prescription today without complaint.  Will continue to monitor for progression.    Dr. Emily Filbert is Medical Director for [place] and [facility].

## 2018-06-28 ENCOUNTER — Encounter: Payer: Medicare PPO | Admitting: *Deleted

## 2018-06-28 DIAGNOSIS — I214 Non-ST elevation (NSTEMI) myocardial infarction: Secondary | ICD-10-CM | POA: Diagnosis not present

## 2018-06-28 DIAGNOSIS — Z951 Presence of aortocoronary bypass graft: Secondary | ICD-10-CM

## 2018-06-28 NOTE — Progress Notes (Signed)
Daily Session Note  Patient Details  Name: Timothy Goodwin MRN: [id number] Date of Birth: [birth year] Referring Provider:     Cardiac Rehab from 05/16/2018 in [facility] Cardiac and Pulmonary Rehab  Referring Provider  Lujean Amel MD      Encounter Date: 06/28/2018  Check In: Session Check In - 06/28/18 0923      Check-In   Supervising physician immediately available to respond to emergencies  See telemetry face sheet for immediately available ER MD    Location  [facility]-Cardiac & Pulmonary Rehab    Staff Present  Heath Lark, RN, BSN, CCRP;Jeanna Durrell BS, Exercise Physiologist;Caleesi [place], MA, RCEP, CCRP, Exercise Physiologist;Joseph [employer], BA, ACSM CEP, Exercise Physiologist    Medication changes reported      No    Fall or balance concerns reported     No    Warm-up and Cool-down  Performed as group-led instruction    Resistance Training Performed  Yes    VAD Patient?  No    PAD/SET Patient?  No      Pain Assessment   Currently in Pain?  No/denies          Social History   Tobacco Use  Smoking Status Never Smoker  Smokeless Tobacco Never Used    Goals Met:  Independence with exercise equipment Exercise tolerated well No report of cardiac concerns or symptoms Strength training completed today  Goals Unmet:  Not Applicable  Comments: Pt able to follow exercise prescription today without complaint.  Will continue to monitor for progression.    Dr. Emily Filbert is Medical Director for [place] and [facility].

## 2018-06-30 ENCOUNTER — Other Ambulatory Visit: Payer: Self-pay

## 2018-06-30 DIAGNOSIS — I214 Non-ST elevation (NSTEMI) myocardial infarction: Secondary | ICD-10-CM | POA: Diagnosis not present

## 2018-06-30 DIAGNOSIS — Z951 Presence of aortocoronary bypass graft: Secondary | ICD-10-CM

## 2018-06-30 NOTE — Progress Notes (Signed)
Daily Session Note  Patient Details  Name: Curley Hogen MRN: [id number] Date of Birth: [birth year] Referring Provider:     Cardiac Rehab from 05/16/2018 in [facility] Cardiac and Pulmonary Rehab  Referring Provider  Lujean Amel MD      Encounter Date: 06/30/2018  Check In: Session Check In - 06/30/18 1235      Check-In   Supervising physician immediately available to respond to emergencies  See telemetry face sheet for immediately available ER MD    Location  [facility]-Cardiac & Pulmonary Rehab    Staff Present  Heath Lark, RN, BSN, CCRP;Jessica [place], MA, RCEP, CCRP, Exercise Physiologist;Worthy Boschert Tessie Fass RCP,RRT,BSRT    Medication changes reported      No    Fall or balance concerns reported     No    Warm-up and Cool-down  Performed as group-led instruction    Resistance Training Performed  Yes    VAD Patient?  No    PAD/SET Patient?  No      Pain Assessment   Currently in Pain?  No/denies          Social History   Tobacco Use  Smoking Status Never Smoker  Smokeless Tobacco Never Used    Goals Met:  Independence with exercise equipment Exercise tolerated well No report of cardiac concerns or symptoms Strength training completed today  Goals Unmet:  Not Applicable  Comments: Pt able to follow exercise prescription today without complaint.  Will continue to monitor for progression.\   Dr. Emily Filbert is Medical Director for [place] and [facility].

## 2018-07-13 ENCOUNTER — Encounter: Payer: Self-pay | Admitting: *Deleted

## 2018-07-13 DIAGNOSIS — Z951 Presence of aortocoronary bypass graft: Secondary | ICD-10-CM

## 2018-07-13 NOTE — Progress Notes (Signed)
Cardiac Individual Treatment Plan  Patient Details  Name: Timothy Goodwin MRN: [id number] Date of Birth: [birth year] Referring Provider:     Cardiac Rehab from 05/16/2018 in [facility] Cardiac and Pulmonary Rehab  Referring Provider  Lujean Amel MD      Initial Encounter Date:    Cardiac Rehab from 05/16/2018 in [facility] Cardiac and Pulmonary Rehab  Date  05/16/18      Visit Diagnosis: S/P CABG x 2  Patient's Home Medications on Admission:  Current Outpatient Medications:  .  allopurinol (ZYLOPRIM) 100 MG tablet, TAKE 1 TABLET DAILY, Disp: , Rfl:  .  amiodarone (PACERONE) 200 MG tablet, , Disp: , Rfl:  .  amLODipine (NORVASC) 10 MG tablet, Take 10 mg by mouth daily. , Disp: , Rfl:  .  atorvastatin (LIPITOR) 40 MG tablet, , Disp: , Rfl:  .  calcium carbonate 1250 MG capsule, Take 1,250 mg by mouth daily. , Disp: , Rfl:  .  cloNIDine (CATAPRES) 0.1 MG tablet, Take 0.1 mg by mouth 2 (two) times daily. , Disp: , Rfl:  .  clopidogrel (PLAVIX) 75 MG tablet, Take by mouth., Disp: , Rfl:  .  furosemide (LASIX) 20 MG tablet, , Disp: , Rfl:  .  heparin 25000-0.45 UT/250ML-% infusion, Inject 1,000 Units/hr into the vein continuous. (Patient not taking: Reported on 05/16/2018), Disp: 10 mL, Rfl: 0 .  isosorbide mononitrate (IMDUR) 60 MG 24 hr tablet, Take 1 tablet (60 mg total) by mouth daily. (Patient not taking: Reported on 05/16/2018), Disp: 30 tablet, Rfl: 0 .  magnesium 30 MG tablet, Take 30 mg by mouth 2 (two) times daily., Disp: , Rfl:  .  metoprolol tartrate (LOPRESSOR) 25 MG tablet, Take 25 mg by mouth 2 (two) times daily. , Disp: , Rfl:  .  Multiple Vitamin (MULTIVITAMIN) capsule, Take 1 capsule by mouth daily., Disp: , Rfl:  .  pantoprazole (PROTONIX) 40 MG tablet, , Disp: , Rfl:  .  potassium chloride (MICRO-K) 10 MEQ CR capsule, , Disp: , Rfl:  .  TURMERIC PO, Take by mouth., Disp: , Rfl:  .  vitamin C (ASCORBIC ACID) 500 MG tablet, Take by mouth., Disp: , Rfl:  .  Vitamin D,  Ergocalciferol, (DRISDOL) 50000 units CAPS capsule, , Disp: , Rfl:  .  vitamin E 200 UNIT capsule, Take by mouth., Disp: , Rfl:   Past Medical History: Past Medical History:  Diagnosis Date  . Hypertension     Tobacco Use: Social History   Tobacco Use  Smoking Status Never Smoker  Smokeless Tobacco Never Used    Labs: Recent Review Scientist, physiological    Labs for ITP Cardiac and Pulmonary Rehab Latest Ref Rng & Units 12/25/2013   Cholestrol 0 - 200 mg/dL 261(H)   LDLCALC 0 - 100 mg/dL 178(H)   HDL 40 - 60 mg/dL 39(L)   Trlycerides 0 - 200 mg/dL 221(H)       Exercise Target Goals: Exercise Program Goal: Individual exercise prescription set using results from initial 6 min walk test and THRR while considering  patient's activity barriers and safety.   Exercise Prescription Goal: Initial exercise prescription builds to 30-45 minutes a day of aerobic activity, 2-3 days per week.  Home exercise guidelines will be given to patient during program as part of exercise prescription that the participant will acknowledge.  Activity Barriers & Risk Stratification: Activity Barriers & Cardiac Risk Stratification - 05/16/18 1307      Activity Barriers & Cardiac Risk Stratification   Activity Barriers  None    Cardiac Risk Stratification  High       6 Minute Walk: 6 Minute Walk    Row Name 05/16/18 1528         6 Minute Walk   Phase  Initial     Distance  1480 feet     Walk Time  6 minutes     # of Rest Breaks  0     MPH  2.8     METS  2.85     RPE  13     VO2 Peak  9.96     Symptoms  No     Resting HR  74 bpm     Resting BP  146/74     Resting Oxygen Saturation   99 %     Exercise Oxygen Saturation  during 6 min walk  100 %     Max Ex. HR  98 bpm     Max Ex. BP  156/64     2 Minute Post BP  146/72        Oxygen Initial Assessment:   Oxygen Re-Evaluation:   Oxygen Discharge (Final Oxygen Re-Evaluation):   Initial Exercise Prescription: Initial Exercise  Prescription - 05/16/18 1500      Date of Initial Exercise RX and Referring Provider   Date  05/16/18    Referring Provider  Lujean Amel MD      Treadmill   MPH  2.4    Grade  0.5    Minutes  15    METs  2.84      REL-XR   Level  1    Speed  50    Minutes  15    METs  2.8      T5 Nustep   Level  1    SPM  80    Minutes  15    METs  2.5      Prescription Details   Frequency (times per week)  2    Duration  Progress to 30 minutes of continuous aerobic without signs/symptoms of physical distress      Intensity   THRR 40-80% of Max Heartrate  100-126    Ratings of Perceived Exertion  11-13    Perceived Dyspnea  0-4      Progression   Progression  Continue to progress workloads to maintain intensity without signs/symptoms of physical distress.      Resistance Training   Training Prescription  Yes    Weight  3 lbs    Reps  10-15       Perform Capillary Blood Glucose checks as needed.  Exercise Prescription Changes: Exercise Prescription Changes    Row Name 05/16/18 1500 06/08/18 1600 06/09/18 1100 06/22/18 1500 07/06/18 1200     Response to Exercise   Blood Pressure (Admit)  146/74  120/68  -  132/64  140/60   Blood Pressure (Exercise)  156/64  140/68  -  124/82  134/72   Blood Pressure (Exit)  146/72  130/68  -  124/80  126/60   Heart Rate (Admit)  74 bpm  79 bpm  -  95 bpm  68 bpm   Heart Rate (Exercise)  98 bpm  115 bpm  -  102 bpm  112 bpm   Heart Rate (Exit)  89 bpm  64 bpm  -  81 bpm  71 bpm   Oxygen Saturation (Admit)  99 %  -  -  -  -  Oxygen Saturation (Exercise)  100 %  -  -  -  -   Rating of Perceived Exertion (Exercise)  13  12  -  11  13   Symptoms  none  none  -  none  none   Comments  walk test results  -  -  -  -   Duration  -  Continue with 30 min of aerobic exercise without signs/symptoms of physical distress.  -  Continue with 30 min of aerobic exercise without signs/symptoms of physical distress.  Continue with 30 min of aerobic  exercise without signs/symptoms of physical distress.   Intensity  -  THRR unchanged  -  THRR unchanged  THRR unchanged     Progression   Progression  -  Continue to progress workloads to maintain intensity without signs/symptoms of physical distress.  -  Continue to progress workloads to maintain intensity without signs/symptoms of physical distress.  Continue to progress workloads to maintain intensity without signs/symptoms of physical distress.   Average METs  -  3.03  -  3.53  3.84     Resistance Training   Training Prescription  -  Yes  -  Yes  Yes   Weight  -  3 lbs  -  3 lbs  3 lbs   Reps  -  10-15  -  10-15  10-15     Interval Training   Interval Training  -  No  -  No  No     Treadmill   MPH  -  2.4  -  2.5  3.4   Grade  -  0.5  -  0.5  0.5   Minutes  -  15  -  15  15   METs  -  3  -  3.09  3.84     REL-XR   Level  -  2  -  3  5   Minutes  -  15  -  15  15   METs  -  3.7  -  5.1  4.1     T5 Nustep   Level  -  2  -  3  4   Minutes  -  15  -  15  15   METs  -  2.4  -  2.4  3.1     Home Exercise Plan   Plans to continue exercise at  -  -  [employer] (comment) [employer] (comment) [employer] (comment) [facility] [place] [facility]   Frequency  -  -  Add 3 additional days to program exercise sessions.  Add 3 additional days to program exercise sessions.  Add 3 additional days to program exercise sessions.   Initial Home Exercises Provided  -  -  06/09/18  06/09/18  06/09/18      Exercise Comments: Exercise Comments    Row Name 05/26/18 0909           Exercise Comments  First full day of exercise!  Patient was oriented to gym and equipment including functions, settings, policies, and procedures.  Patient's individual exercise prescription and treatment plan were reviewed.  All starting workloads were established based on the results of the 6 minute walk test done at initial orientation visit.  The plan for exercise progression  was also introduced and progression will be customized based on patient's performance and goals.  Exercise Goals and Review: Exercise Goals    Row Name 05/16/18 1532             Exercise Goals   Increase Physical Activity  Yes       Intervention  Provide advice, education, support and counseling about physical activity/exercise needs.;Develop an individualized exercise prescription for aerobic and resistive training based on initial evaluation findings, risk stratification, comorbidities and participant's personal goals.       Expected Outcomes  Short Term: Attend rehab on a regular basis to increase amount of physical activity.;Long Term: Add in home exercise to make exercise part of routine and to increase amount of physical activity.;Long Term: Exercising regularly at least 3-5 days a week.       Increase Strength and Stamina  Yes       Intervention  Develop an individualized exercise prescription for aerobic and resistive training based on initial evaluation findings, risk stratification, comorbidities and participant's personal goals.;Provide advice, education, support and counseling about physical activity/exercise needs.       Expected Outcomes  Short Term: Increase workloads from initial exercise prescription for resistance, speed, and METs.;Short Term: Perform resistance training exercises routinely during rehab and add in resistance training at home;Long Term: Improve cardiorespiratory fitness, muscular endurance and strength as measured by increased METs and functional capacity (6MWT)       Able to understand and use rate of perceived exertion (RPE) scale  Yes       Intervention  Provide education and explanation on how to use RPE scale       Expected Outcomes  Short Term: Able to use RPE daily in rehab to express subjective intensity level;Long Term:  Able to use RPE to guide intensity level when exercising independently       Knowledge and understanding of Target Heart Rate  Range (THRR)  Yes       Intervention  Provide education and explanation of THRR including how the numbers were predicted and where they are located for reference       Expected Outcomes  Short Term: Able to state/look up THRR;Short Term: Able to use daily as guideline for intensity in rehab;Long Term: Able to use THRR to govern intensity when exercising independently       Able to check pulse independently  Yes       Intervention  Provide education and demonstration on how to check pulse in carotid and radial arteries.;Review the importance of being able to check your own pulse for safety during independent exercise       Expected Outcomes  Long Term: Able to check pulse independently and accurately;Short Term: Able to explain why pulse checking is important during independent exercise       Understanding of Exercise Prescription  Yes       Intervention  Provide education, explanation, and written materials on patient's individual exercise prescription       Expected Outcomes  Short Term: Able to explain program exercise prescription;Long Term: Able to explain home exercise prescription to exercise independently          Exercise Goals Re-Evaluation : Exercise Goals Re-Evaluation    Row Name 05/26/18 4098 06/08/18 1614 06/09/18 1112 06/22/18 1544 07/06/18 1208     Exercise Goal Re-Evaluation   Exercise Goals Review  Increase Physical Activity;Increase Strength and Stamina;Able to understand and use rate of perceived exertion (RPE) scale;Knowledge and understanding of Target Heart Rate Range (THRR);Understanding of Exercise Prescription  Increase Physical Activity;Increase Strength and Stamina;Understanding  of Exercise Prescription  Increase Physical Activity;Increase Strength and Stamina;Understanding of Exercise Prescription;Able to understand and use rate of perceived exertion (RPE) scale;Knowledge and understanding of Target Heart Rate Range (THRR);Able to check pulse independently  Increase  Physical Activity;Increase Strength and Stamina;Understanding of Exercise Prescription  Increase Physical Activity;Increase Strength and Stamina;Understanding of Exercise Prescription   Comments  Reviewed RPE scale, THR and program prescription with pt today.  Pt voiced understanding and was given a copy of goals to take home.   Timothy Goodwin is off to a good start in rehab.  He is already up level 2 on the T5 Nustep.  We review home exercise guidelines soon and continue to monitor his progress.   Reviewed home exercise with pt today.  Pt plans to use treadmill and gym at [facility] for exercise.  Reviewed THR, pulse, RPE, sign and symptoms, NTG use, and when to call 911 or MD.  Also discussed weather considerations and indoor options.  Pt voiced understanding.  Timothy Goodwin has been doing well in rehab.  He is now up to level 4 on the T5 NuStep.  We will continue to monitor her progress.   Timothy Goodwin continues to well in rehab.  He will be walking and going to [facility].  We will continue to monitor his progress at home.    Expected Outcomes  Short: Use RPE daily to regulate intensity. Long: Follow program prescription in THR.  Short: Review home exercise guidelines and increase workloads.  Long: Continue to increase strength and stamina.   Short: Continue to use treadmill on off days.  Long: Continue to build up to 30 min of exercise daily.   Short: Continue to increase workloads.  Long: Continue to increase stamina.   Short: Continue to walk at home.  Long: Continue to increase strength       Discharge Exercise Prescription (Final Exercise Prescription Changes): Exercise Prescription Changes - 07/06/18 1200      Response to Exercise   Blood Pressure (Admit)  140/60    Blood Pressure (Exercise)  134/72    Blood Pressure (Exit)  126/60    Heart Rate (Admit)  68 bpm    Heart Rate (Exercise)  112 bpm    Heart Rate (Exit)  71 bpm    Rating of Perceived Exertion (Exercise)  13    Symptoms  none    Duration  Continue with  30 min of aerobic exercise without signs/symptoms of physical distress.    Intensity  THRR unchanged      Progression   Progression  Continue to progress workloads to maintain intensity without signs/symptoms of physical distress.    Average METs  3.84      Resistance Training   Training Prescription  Yes    Weight  3 lbs    Reps  10-15      Interval Training   Interval Training  No      Treadmill   MPH  3.4    Grade  0.5    Minutes  15    METs  3.84      REL-XR   Level  5    Minutes  15    METs  4.1      T5 Nustep   Level  4    Minutes  15    METs  3.1      Home Exercise Plan   Plans to continue exercise at  [employer] (comment)   [employer]  Add 3 additional days to program exercise sessions.    Initial Home Exercises Provided  06/09/18       Nutrition:  Target Goals: Understanding of nutrition guidelines, daily intake of sodium '1500mg'$ , cholesterol '200mg'$ , calories 30% from fat and 7% or less from saturated fats, daily to have 5 or more servings of fruits and vegetables.  Biometrics: Pre Biometrics - 05/16/18 1532      Pre Biometrics   Height  5' 5.1" (1.654 m)    Weight  154 lb (69.9 kg)    Waist Circumference  37 inches    Hip Circumference  36.5 inches    Waist to Hip Ratio  1.01 %    BMI (Calculated)  25.53    Single Leg Stand  18.1 seconds        Nutrition Therapy Plan and Nutrition Goals: Nutrition Therapy & Goals - 05/16/18 1305      Intervention Plan   Intervention  Prescribe, educate and counsel regarding individualized specific dietary modifications aiming towards targeted core components such as weight, hypertension, lipid management, diabetes, heart failure and other comorbidities.;Nutrition handout(s) given to patient.    Expected Outcomes  Short Term Goal: Understand basic principles of dietary content, such as calories, fat, sodium, cholesterol and nutrients.;Short Term Goal: A plan has been developed with  personal nutrition goals set during dietitian appointment.;Long Term Goal: Adherence to prescribed nutrition plan.       Nutrition Assessments: Nutrition Assessments - 05/16/18 1306      MEDFICTS Scores   Pre Score  37       Nutrition Goals Re-Evaluation: Nutrition Goals Re-Evaluation    [place] Name 06/09/18 1112 06/23/18 1004           Goals   Current Weight  -  161 lb (73 kg)      Nutrition Goal  Heart Healthy diet, low sodium  Heart Healthy diet and maintain weight.      Comment  Timothy Goodwin has been doing well with his diet.  They live in [place] and his wife watches his diet closely.  They eat a lot of fruit and vegetables.  He has been trying to eat more chicken and fish for lean meats.  He Is watching his salt intake too.  We will conitnue to monitor his diet.   Timothy Goodwin wants to maintain his weight now. He was working [employer] and people had brought in unhealthy snacks and had a couple of donuts. He does watch his diet closely on most other days.      Expected Outcome  Short: Continue to eat healthy.  Long: Continue to watch sodium.   Short: watch sweets intake. Long: maintain weight.         Nutrition Goals Discharge (Final Nutrition Goals Re-Evaluation): Nutrition Goals Re-Evaluation - 06/23/18 1004      Goals   Current Weight  161 lb (73 kg)    Nutrition Goal  Heart Healthy diet and maintain weight.    Comment  Timothy Goodwin wants to maintain his weight now. He was working [employer] and people had brought in unhealthy snacks and had a couple of donuts. He does watch his diet closely on most other days.    Expected Outcome  Short: watch sweets intake. Long: maintain weight.       Psychosocial: Target Goals: Acknowledge presence or absence of significant depression and/or stress, maximize coping skills, provide positive support system. Participant is able to verbalize types and ability to use techniques and skills  needed for reducing stress and depression.   Initial Review &  Psychosocial Screening: Initial Psych Review & Screening - 05/16/18 1304      Initial Review   Current issues with  None Identified      Family Dynamics   Good Support System?  Yes   spouse and adult children   Comments  Timothy Goodwin and his wife have lived in [place] for about the last 6 years. They love it and enjoy the community setting.       Barriers   Psychosocial barriers to participate in program  The patient should benefit from training in stress management and relaxation.;There are no identifiable barriers or psychosocial needs.      Screening Interventions   Interventions  Encouraged to exercise;Program counselor consult;To provide support and resources with identified psychosocial needs;Provide feedback about the scores to participant    Expected Outcomes  Short Term goal: Utilizing psychosocial counselor, staff and physician to assist with identification of specific Stressors or current issues interfering with healing process. Setting desired goal for each stressor or current issue identified.;Long Term Goal: Stressors or current issues are controlled or eliminated.;Short Term goal: Identification and review with participant of any Quality of Life or Depression concerns found by scoring the questionnaire.;Long Term goal: The participant improves quality of Life and PHQ9 Scores as seen by post scores and/or verbalization of changes       Quality of Life Scores:  Quality of Life - 05/16/18 1305      Quality of Life   Select  Quality of Life      Quality of Life Scores   Health/Function Pre  26.25 %    Socioeconomic Pre  27.5 %    Psych/Spiritual Pre  26.79 %    Family Pre  25.9 %    GLOBAL Pre  24.48 %      Scores of 19 and below usually indicate a poorer quality of life in these areas.  A difference of  2-3 points is a clinically meaningful difference.  A difference of 2-3 points in the total score of the Quality of Life Index has been associated with significant improvement  in overall quality of life, self-image, physical symptoms, and general health in studies assessing change in quality of life.  PHQ-9: Recent Review Flowsheet Data    Depression screen [facility] 2/9 05/16/2018   Decreased Interest 0   Down, Depressed, Hopeless 0   PHQ - 2 Score 0   Altered sleeping 0   Tired, decreased energy 0   Change in appetite 0   Feeling bad or failure about yourself  0   Trouble concentrating 0   Moving slowly or fidgety/restless 0   Suicidal thoughts 0   PHQ-9 Score 0     Interpretation of Total Score  Total Score Depression Severity:  1-4 = Minimal depression, 5-9 = Mild depression, 10-14 = Moderate depression, 15-19 = Moderately severe depression, 20-27 = Severe depression   Psychosocial Evaluation and Intervention: Psychosocial Evaluation - 06/02/18 1046      Psychosocial Evaluation & Interventions   Interventions  Encouraged to exercise with the program and follow exercise prescription    Comments  Counselor met with Timothy Goodwin) today for initial psychosocial evaluation.  He is an 84 year old who had a CABG x2 late December, 2019.  He has a strong support system with a spouse; (4) adult children and lives in a retirement community.  He has had prostate cancer surgery 20+_ years  ago and reports Drs are keeping an "eye on his numbers."  He sleeps well and has a good appetite.  Timothy Goodwin denies a history of depression or anxiety or any current symptoms and is typically in a positive mood.  He has goals to increase his stamina and strength and improve his muscle mass.  He will be followed by staff.      Expected Outcomes  Short:  Timothy Goodwin will exercise with the program to increase his stamina and strength and recover his muscle mass.  Long:  Timothy Goodwin will continue to exercise to maintain his progress in this program.       Psychosocial Re-Evaluation: Psychosocial Re-Evaluation    [place] Name 06/23/18 1001 06/30/18 1035           Psychosocial Re-Evaluation   Current  issues with  None Identified  None Identified      Comments  Timothy Goodwin states that he is in high spirits and has no stress or depression concerns.   Patient reports that he exercises at [facility] facility in addition to [employer].  He feels that staying active improves his mood and enjoys an active support system.      Expected Outcomes  Short: continue [facility] to keep stress at a minimum. Long: Maintain a positive outlook on Life.  Short: continue exercie program; long: maintain and enjoy his support system.      Interventions  Encouraged to attend Cardiac Rehabilitation for the exercise  -      Continue Psychosocial Services   Follow up required by staff  Follow up required by staff         Psychosocial Discharge (Final Psychosocial Re-Evaluation): Psychosocial Re-Evaluation - 06/30/18 1035      Psychosocial Re-Evaluation   Current issues with  None Identified    Comments  Patient reports that he exercises at [facility] facility in addition to [employer].  He feels that staying active improves his mood and enjoys an active support system.    Expected Outcomes  Short: continue exercie program; long: maintain and enjoy his support system.    Continue Psychosocial Services   Follow up required by staff       Vocational Rehabilitation: Provide vocational rehab assistance to qualifying candidates.   Vocational Rehab Evaluation & Intervention: Vocational Rehab - 05/16/18 1304      Initial Vocational Rehab Evaluation & Intervention   Assessment shows need for Vocational Rehabilitation  No       Education: Education Goals: Education classes will be provided on a variety of topics geared toward better understanding of heart health and risk factor modification. Participant will state understanding/return demonstration of topics presented as noted by education test scores.  Learning Barriers/Preferences: Learning Barriers/Preferences - 05/16/18 1257      Learning Barriers/Preferences    Learning Barriers  None    Learning Preferences  None       Education Topics:  AED/CPR: - Group verbal and written instruction with the use of models to demonstrate the basic use of the AED with the basic ABC's of resuscitation.   Cardiac Rehab from 06/30/2018 in [facility] Cardiac and Pulmonary Rehab  Date  06/14/18  Educator  SB  Instruction Review Code  1- Verbalizes Understanding      General Nutrition Guidelines/Fats and Fiber: -Group instruction provided by verbal, written material, models and posters to present the general guidelines for heart healthy nutrition. Gives an explanation and review of dietary fats and fiber.   Cardiac Rehab from 06/30/2018  in [facility] Cardiac and Pulmonary Rehab  Date  06/28/18  Educator  [employer] Endoscopy  Instruction Review Code  1- Verbalizes Understanding      Controlling Sodium/Reading Food Labels: -Group verbal and written material supporting the discussion of sodium use in heart healthy nutrition. Review and explanation with models, verbal and written materials for utilization of the food label.   Cardiac Rehab from 06/30/2018 in [facility] Cardiac and Pulmonary Rehab  Date  06/30/18  Educator  [facility]  Instruction Review Code  1- Verbalizes Understanding      Exercise Physiology & General Exercise Guidelines: - Group verbal and written instruction with models to review the exercise physiology of the cardiovascular system and associated critical values. Provides general exercise guidelines with specific guidelines to those with heart or lung disease.    Aerobic Exercise & Resistance Training: - Gives group verbal and written instruction on the various components of exercise. Focuses on aerobic and resistive training programs and the benefits of this training and how to safely progress through these programs..   Flexibility, Balance, Mind/Body Relaxation: Provides group verbal/written instruction on the benefits of flexibility and balance training, including mind/body  exercise modes such as yoga, pilates and tai chi.  Demonstration and skill practice provided.   Stress and Anxiety: - Provides group verbal and written instruction about the health risks of elevated stress and causes of high stress.  Discuss the correlation between heart/lung disease and anxiety and treatment options. Review healthy ways to manage with stress and anxiety.   Cardiac Rehab from 06/30/2018 in [facility] Cardiac and Pulmonary Rehab  Date  06/07/18  Educator  [facility]  Instruction Review Code  1- Verbalizes Understanding      Depression: - Provides group verbal and written instruction on the correlation between heart/lung disease and depressed mood, treatment options, and the stigmas associated with seeking treatment.   Anatomy & Physiology of the Heart: - Group verbal and written instruction and models provide basic cardiac anatomy and physiology, with the coronary electrical and arterial systems. Review of Valvular disease and Heart Failure   Cardiac Rehab from 06/30/2018 in [facility] Cardiac and Pulmonary Rehab  Date  05/26/18  Educator  CE  Instruction Review Code  1- Verbalizes Understanding      Cardiac Procedures: - Group verbal and written instruction to review commonly prescribed medications for heart disease. Reviews the medication, class of the drug, and side effects. Includes the steps to properly store meds and maintain the prescription regimen. (beta blockers and nitrates)   Cardiac Rehab from 06/30/2018 in [facility] Cardiac and Pulmonary Rehab  Date  06/09/18  Educator  CE  Instruction Review Code  1- Verbalizes Understanding      Cardiac Medications I: - Group verbal and written instruction to review commonly prescribed medications for heart disease. Reviews the medication, class of the drug, and side effects. Includes the steps to properly store meds and maintain the prescription regimen.   Cardiac Rehab from 06/30/2018 in [facility] Cardiac and Pulmonary Rehab  Date  05/31/18   Educator  [facility]  Instruction Review Code  1- Verbalizes Understanding      Cardiac Medications II: -Group verbal and written instruction to review commonly prescribed medications for heart disease. Reviews the medication, class of the drug, and side effects. (all other drug classes)   Cardiac Rehab from 06/30/2018 in [facility] Cardiac and Pulmonary Rehab  Date  06/23/18  Educator  CE  Instruction Review Code  1- Verbalizes Understanding       Go Sex-Intimacy &  Heart Disease, Get SMART - Goal Setting: - Group verbal and written instruction through game format to discuss heart disease and the return to sexual intimacy. Provides group verbal and written material to discuss and apply goal setting through the application of the S.M.A.R.T. Method.   Cardiac Rehab from 06/30/2018 in [facility] Cardiac and Pulmonary Rehab  Date  06/09/18  Educator  CE  Instruction Review Code  1- Verbalizes Understanding      Other Matters of the Heart: - Provides group verbal, written materials and models to describe Stable Angina and Peripheral Artery. Includes description of the disease process and treatment options available to the cardiac patient.   Cardiac Rehab from 06/30/2018 in [facility] Cardiac and Pulmonary Rehab  Date  05/26/18  Educator  CE  Instruction Review Code  1- Verbalizes Understanding      Exercise & Equipment Safety: - Individual verbal instruction and demonstration of equipment use and safety with use of the equipment.   Cardiac Rehab from 06/30/2018 in [facility] Cardiac and Pulmonary Rehab  Date  05/16/18  Educator  [facility]  Instruction Review Code  1- Verbalizes Understanding      Infection Prevention: - Provides verbal and written material to individual with discussion of infection control including proper hand washing and proper equipment cleaning during exercise session.   Cardiac Rehab from 06/30/2018 in [facility] Cardiac and Pulmonary Rehab  Date  05/16/18  Educator  [facility]  Instruction Review Code  1-  Verbalizes Understanding      Falls Prevention: - Provides verbal and written material to individual with discussion of falls prevention and safety.   Cardiac Rehab from 06/30/2018 in [facility] Cardiac and Pulmonary Rehab  Date  05/16/18  Educator  [facility]  Instruction Review Code  1- Verbalizes Understanding      Diabetes: - Individual verbal and written instruction to review signs/symptoms of diabetes, desired ranges of glucose level fasting, after meals and with exercise. Acknowledge that pre and post exercise glucose checks will be done for 3 sessions at entry of program.   Know Your Numbers and Risk Factors: -Group verbal and written instruction about important numbers in your health.  Discussion of what are risk factors and how they play a role in the disease process.  Review of Cholesterol, Blood Pressure, Diabetes, and BMI and the role they play in your overall health.   Cardiac Rehab from 06/30/2018 in [facility] Cardiac and Pulmonary Rehab  Date  06/23/18  Educator  CE  Instruction Review Code  1- Verbalizes Understanding      Sleep Hygiene: -Provides group verbal and written instruction about how sleep can affect your health.  Define sleep hygiene, discuss sleep cycles and impact of sleep habits. Review good sleep hygiene tips.    Other: -Provides group and verbal instruction on various topics (see comments)   Knowledge Questionnaire Score: Knowledge Questionnaire Score - 05/16/18 1257      Knowledge Questionnaire Score   Pre Score  24/26   correct answers reviewed with Timothy Goodwin. Focus on Nutrition      Core Components/Risk Factors/Patient Goals at Admission: Personal Goals and Risk Factors at Admission - 05/16/18 1255      Core Components/Risk Factors/Patient Goals on Admission    Weight Management  Yes;Weight Gain    Intervention  Weight Management: Develop a combined nutrition and exercise program designed to reach desired caloric intake, while maintaining appropriate intake  of nutrient and fiber, sodium and fats, and appropriate energy expenditure required for the weight goal.;Weight Management: Provide  education and appropriate resources to help participant work on and attain dietary goals.    Admit Weight  154 lb (69.9 kg)    Goal Weight: Short Term  160 lb (72.6 kg)    Expected Outcomes  Short Term: Continue to assess and modify interventions until short term weight is achieved;Long Term: Adherence to nutrition and physical activity/exercise program aimed toward attainment of established weight goal;Weight Maintenance: Understanding of the daily nutrition guidelines, which includes 25-35% calories from fat, 7% or less cal from saturated fats, less than '200mg'$  cholesterol, less than 1.5gm of sodium, & 5 or more servings of fruits and vegetables daily;Understanding recommendations for meals to include 15-35% energy as protein, 25-35% energy from fat, 35-60% energy from carbohydrates, less than '200mg'$  of dietary cholesterol, 20-35 gm of total fiber daily;Understanding of distribution of calorie intake throughout the day with the consumption of 4-5 meals/snacks;Weight Gain: Understanding of general recommendations for a high calorie, high protein meal plan that promotes weight gain by distributing calorie intake throughout the day with the consumption for 4-5 meals, snacks, and/or supplements    Hypertension  Yes    Intervention  Provide education on lifestyle modifcations including regular physical activity/exercise, weight management, moderate sodium restriction and increased consumption of fresh fruit, vegetables, and low fat dairy, alcohol moderation, and smoking cessation.;Monitor prescription use compliance.    Expected Outcomes  Short Term: Continued assessment and intervention until BP is < 140/13m HG in hypertensive participants. < 130/876mHG in hypertensive participants with diabetes, heart failure or chronic kidney disease.;Long Term: Maintenance of blood pressure at  goal levels.    Lipids  Yes    Intervention  Provide education and support for participant on nutrition & aerobic/resistive exercise along with prescribed medications to achieve LDL '70mg'$ , HDL >'40mg'$ .    Expected Outcomes  Short Term: Participant states understanding of desired cholesterol values and is compliant with medications prescribed. Participant is following exercise prescription and nutrition guidelines.;Long Term: Cholesterol controlled with medications as prescribed, with individualized exercise RX and with personalized nutrition plan. Value goals: LDL < '70mg'$ , HDL > 40 mg.       Core Components/Risk Factors/Patient Goals Review:  Goals and Risk Factor Review    Row Name 06/09/18 1116 06/23/18 1006           Core Components/Risk Factors/Patient Goals Review   Personal Goals Review  Weight Management/Obesity;Hypertension;Lipids  Weight Management/Obesity;Hypertension;Lipids      Review  Timothy Goodwin been able to gain some weight back which has made him happy.  He is doing well with his blood pressures and checks them routinely at home.  He normally gets around 120-130s/70s.  He is doing well on his medications and sees his renal doctor tomorrow about those medications.   Johns blood pressure was a little higher today. He is checking his blood pressure at home and it varies. He is in the 140's to 12948'Nor systolic. He is not sure why he has been jumping up and down with his pressure. He is taking a statin to help his lipids. Patient has started taking lipitor after his heart issues. He does not want to take a statin but he knows he has to consult his doctor about it. His kidney doctor wants him to be on more medications but his cardiologist does not want him on those certain medications.      Expected Outcomes  Short: Talk to doctor about renal meds tomorrow.  Long: Continue to maintain weight now.   Short: talk  to cardiologist about his lipid medication. Long: maintain medications and take  medicaions as needed.         Core Components/Risk Factors/Patient Goals at Discharge (Final Review):  Goals and Risk Factor Review - 06/23/18 1006      Core Components/Risk Factors/Patient Goals Review   Personal Goals Review  Weight Management/Obesity;Hypertension;Lipids    Review  Johns blood pressure was a little higher today. He is checking his blood pressure at home and it varies. He is in the 140's to 132'G for systolic. He is not sure why he has been jumping up and down with his pressure. He is taking a statin to help his lipids. Patient has started taking lipitor after his heart issues. He does not want to take a statin but he knows he has to consult his doctor about it. His kidney doctor wants him to be on more medications but his cardiologist does not want him on those certain medications.    Expected Outcomes  Short: talk to cardiologist about his lipid medication. Long: maintain medications and take medicaions as needed.       ITP Comments: ITP Comments    Row Name 05/16/18 1236 05/18/18 1356 06/15/18 0616 07/06/18 1207 07/13/18 1222   ITP Comments  Med Review completed. Initial ITP created. Diagnosis can be found in [facility] 12/17  30 day review completed. ITP sent to Dr. Emily Filbert, Medical Director of Cardiac Rehab. Continue with ITP unless changes are made by physician.  30 day review. Continue with ITP unless directed changes by Medical Director chart review.  Our program is currently closed due to COVID-19.  We are communicating with patient via phone calls and emails.    30 day review. Continue with ITP unless directed changes by Medical Director chart review.      Comments:

## 2018-07-26 ENCOUNTER — Encounter: Payer: Self-pay | Admitting: *Deleted

## 2018-07-26 DIAGNOSIS — Z951 Presence of aortocoronary bypass graft: Secondary | ICD-10-CM

## 2018-08-15 ENCOUNTER — Encounter: Payer: Self-pay | Admitting: *Deleted

## 2018-08-15 DIAGNOSIS — Z951 Presence of aortocoronary bypass graft: Secondary | ICD-10-CM

## 2018-08-25 DIAGNOSIS — Z951 Presence of aortocoronary bypass graft: Secondary | ICD-10-CM

## 2018-09-06 ENCOUNTER — Encounter: Payer: Self-pay | Admitting: *Deleted

## 2018-09-06 DIAGNOSIS — Z951 Presence of aortocoronary bypass graft: Secondary | ICD-10-CM

## 2018-10-24 ENCOUNTER — Encounter: Payer: Self-pay | Admitting: *Deleted

## 2018-10-24 DIAGNOSIS — Z951 Presence of aortocoronary bypass graft: Secondary | ICD-10-CM

## 2018-10-24 NOTE — Progress Notes (Signed)
Cardiac Individual Treatment Plan  Patient Details  Name: Timothy Goodwin MRN: [id number] Date of Birth: [birth year] Referring Provider:     Cardiac Rehab from 05/16/2018 in [facility] Cardiac and Pulmonary Rehab  Referring Provider  Lujean Amel MD      Initial Encounter Date:    Cardiac Rehab from 05/16/2018 in [facility] Cardiac and Pulmonary Rehab  Date  05/16/18      Visit Diagnosis: S/P CABG x 2  Patient's Home Medications on Admission:  Current Outpatient Medications:  .  allopurinol (ZYLOPRIM) 100 MG tablet, TAKE 1 TABLET DAILY, Disp: , Rfl:  .  amiodarone (PACERONE) 200 MG tablet, , Disp: , Rfl:  .  amLODipine (NORVASC) 10 MG tablet, Take 10 mg by mouth daily. , Disp: , Rfl:  .  atorvastatin (LIPITOR) 40 MG tablet, , Disp: , Rfl:  .  calcium carbonate 1250 MG capsule, Take 1,250 mg by mouth daily. , Disp: , Rfl:  .  cloNIDine (CATAPRES) 0.1 MG tablet, Take 0.1 mg by mouth 2 (two) times daily. , Disp: , Rfl:  .  clopidogrel (PLAVIX) 75 MG tablet, Take by mouth., Disp: , Rfl:  .  furosemide (LASIX) 20 MG tablet, , Disp: , Rfl:  .  heparin 25000-0.45 UT/250ML-% infusion, Inject 1,000 Units/hr into the vein continuous. (Patient not taking: Reported on 05/16/2018), Disp: 10 mL, Rfl: 0 .  isosorbide mononitrate (IMDUR) 60 MG 24 hr tablet, Take 1 tablet (60 mg total) by mouth daily. (Patient not taking: Reported on 05/16/2018), Disp: 30 tablet, Rfl: 0 .  magnesium 30 MG tablet, Take 30 mg by mouth 2 (two) times daily., Disp: , Rfl:  .  metoprolol tartrate (LOPRESSOR) 25 MG tablet, Take 25 mg by mouth 2 (two) times daily. , Disp: , Rfl:  .  Multiple Vitamin (MULTIVITAMIN) capsule, Take 1 capsule by mouth daily., Disp: , Rfl:  .  pantoprazole (PROTONIX) 40 MG tablet, , Disp: , Rfl:  .  potassium chloride (MICRO-K) 10 MEQ CR capsule, , Disp: , Rfl:  .  TURMERIC PO, Take by mouth., Disp: , Rfl:  .  vitamin C (ASCORBIC ACID) 500 MG tablet, Take by mouth., Disp: , Rfl:  .  Vitamin D,  Ergocalciferol, (DRISDOL) 50000 units CAPS capsule, , Disp: , Rfl:  .  vitamin E 200 UNIT capsule, Take by mouth., Disp: , Rfl:   Past Medical History: Past Medical History:  Diagnosis Date  . Hypertension     Tobacco Use: Social History   Tobacco Use  Smoking Status Never Smoker  Smokeless Tobacco Never Used    Labs: Recent Review Scientist, physiological    Labs for ITP Cardiac and Pulmonary Rehab Latest Ref Rng & Units 12/25/2013   Cholestrol 0 - 200 mg/dL 261(H)   LDLCALC 0 - 100 mg/dL 178(H)   HDL 40 - 60 mg/dL 39(L)   Trlycerides 0 - 200 mg/dL 221(H)       Exercise Target Goals: Exercise Program Goal: Individual exercise prescription set using results from initial 6 min walk test and THRR while considering  patient's activity barriers and safety.   Exercise Prescription Goal: Initial exercise prescription builds to 30-45 minutes a day of aerobic activity, 2-3 days per week.  Home exercise guidelines will be given to patient during program as part of exercise prescription that the participant will acknowledge.  Activity Barriers & Risk Stratification: Activity Barriers & Cardiac Risk Stratification - 05/16/18 1307      Activity Barriers & Cardiac Risk Stratification   Activity Barriers  None    Cardiac Risk Stratification  High       6 Minute Walk: 6 Minute Walk    Row Name 05/16/18 1528         6 Minute Walk   Phase  Initial     Distance  1480 feet     Walk Time  6 minutes     # of Rest Breaks  0     MPH  2.8     METS  2.85     RPE  13     VO2 Peak  9.96     Symptoms  No     Resting HR  74 bpm     Resting BP  146/74     Resting Oxygen Saturation   99 %     Exercise Oxygen Saturation  during 6 min walk  100 %     Max Ex. HR  98 bpm     Max Ex. BP  156/64     2 Minute Post BP  146/72        Oxygen Initial Assessment:   Oxygen Re-Evaluation:   Oxygen Discharge (Final Oxygen Re-Evaluation):   Initial Exercise Prescription: Initial Exercise  Prescription - 05/16/18 1500      Date of Initial Exercise RX and Referring Provider   Date  05/16/18    Referring Provider  Lujean Amel MD      Treadmill   MPH  2.4    Grade  0.5    Minutes  15    METs  2.84      REL-XR   Level  1    Speed  50    Minutes  15    METs  2.8      T5 Nustep   Level  1    SPM  80    Minutes  15    METs  2.5      Prescription Details   Frequency (times per week)  2    Duration  Progress to 30 minutes of continuous aerobic without signs/symptoms of physical distress      Intensity   THRR 40-80% of Max Heartrate  100-126    Ratings of Perceived Exertion  11-13    Perceived Dyspnea  0-4      Progression   Progression  Continue to progress workloads to maintain intensity without signs/symptoms of physical distress.      Resistance Training   Training Prescription  Yes    Weight  3 lbs    Reps  10-15       Perform Capillary Blood Glucose checks as needed.  Exercise Prescription Changes: Exercise Prescription Changes    Row Name 05/16/18 1500 06/08/18 1600 06/09/18 1100 06/22/18 1500 07/06/18 1200     Response to Exercise   Blood Pressure (Admit)  146/74  120/68  -  132/64  140/60   Blood Pressure (Exercise)  156/64  140/68  -  124/82  134/72   Blood Pressure (Exit)  146/72  130/68  -  124/80  126/60   Heart Rate (Admit)  74 bpm  79 bpm  -  95 bpm  68 bpm   Heart Rate (Exercise)  98 bpm  115 bpm  -  102 bpm  112 bpm   Heart Rate (Exit)  89 bpm  64 bpm  -  81 bpm  71 bpm   Oxygen Saturation (Admit)  99 %  -  -  -  -  Oxygen Saturation (Exercise)  100 %  -  -  -  -   Rating of Perceived Exertion (Exercise)  13  12  -  11  13   Symptoms  none  none  -  none  none   Comments  walk test results  -  -  -  -   Duration  -  Continue with 30 min of aerobic exercise without signs/symptoms of physical distress.  -  Continue with 30 min of aerobic exercise without signs/symptoms of physical distress.  Continue with 30 min of aerobic  exercise without signs/symptoms of physical distress.   Intensity  -  THRR unchanged  -  THRR unchanged  THRR unchanged     Progression   Progression  -  Continue to progress workloads to maintain intensity without signs/symptoms of physical distress.  -  Continue to progress workloads to maintain intensity without signs/symptoms of physical distress.  Continue to progress workloads to maintain intensity without signs/symptoms of physical distress.   Average METs  -  3.03  -  3.53  3.84     Resistance Training   Training Prescription  -  Yes  -  Yes  Yes   Weight  -  3 lbs  -  3 lbs  3 lbs   Reps  -  10-15  -  10-15  10-15     Interval Training   Interval Training  -  No  -  No  No     Treadmill   MPH  -  2.4  -  2.5  3.4   Grade  -  0.5  -  0.5  0.5   Minutes  -  15  -  15  15   METs  -  3  -  3.09  3.84     REL-XR   Level  -  2  -  3  5   Minutes  -  15  -  15  15   METs  -  3.7  -  5.1  4.1     T5 Nustep   Level  -  2  -  3  4   Minutes  -  15  -  15  15   METs  -  2.4  -  2.4  3.1     Home Exercise Plan   Plans to continue exercise at  -  -  [employer] (comment) [employer] (comment) [employer] ([facility]) [facility] [place] [facility]   Frequency  -  -  Add 3 additional days to program exercise sessions.  Add 3 additional days to program exercise sessions.  Add 3 additional days to program exercise sessions.   Initial Home Exercises Provided  -  -  06/09/18  06/09/18  06/09/18      Exercise Comments: Exercise Comments    Row Name 05/26/18 0909           Exercise Comments  First full day of exercise!  Patient was oriented to gym and equipment including functions, settings, policies, and procedures.  Patient's individual exercise prescription and treatment plan were reviewed.  All starting workloads were established based on the results of the 6 minute walk test done at initial orientation visit.  The plan for exercise progression  was also introduced and progression will be customized based on patient's performance and goals.  Exercise Goals and Review: Exercise Goals    Row Name 05/16/18 1532             Exercise Goals   Increase Physical Activity  Yes       Intervention  Provide advice, education, support and counseling about physical activity/exercise needs.;Develop an individualized exercise prescription for aerobic and resistive training based on initial evaluation findings, risk stratification, comorbidities and participant's personal goals.       Expected Outcomes  Short Term: Attend rehab on a regular basis to increase amount of physical activity.;Long Term: Add in home exercise to make exercise part of routine and to increase amount of physical activity.;Long Term: Exercising regularly at least 3-5 days a week.       Increase Strength and Stamina  Yes       Intervention  Develop an individualized exercise prescription for aerobic and resistive training based on initial evaluation findings, risk stratification, comorbidities and participant's personal goals.;Provide advice, education, support and counseling about physical activity/exercise needs.       Expected Outcomes  Short Term: Increase workloads from initial exercise prescription for resistance, speed, and METs.;Short Term: Perform resistance training exercises routinely during rehab and add in resistance training at home;Long Term: Improve cardiorespiratory fitness, muscular endurance and strength as measured by increased METs and functional capacity (6MWT)       Able to understand and use rate of perceived exertion (RPE) scale  Yes       Intervention  Provide education and explanation on how to use RPE scale       Expected Outcomes  Short Term: Able to use RPE daily in rehab to express subjective intensity level;Long Term:  Able to use RPE to guide intensity level when exercising independently       Knowledge and understanding of Target Heart Rate  Range (THRR)  Yes       Intervention  Provide education and explanation of THRR including how the numbers were predicted and where they are located for reference       Expected Outcomes  Short Term: Able to state/look up THRR;Short Term: Able to use daily as guideline for intensity in rehab;Long Term: Able to use THRR to govern intensity when exercising independently       Able to check pulse independently  Yes       Intervention  Provide education and demonstration on how to check pulse in carotid and radial arteries.;Review the importance of being able to check your own pulse for safety during independent exercise       Expected Outcomes  Long Term: Able to check pulse independently and accurately;Short Term: Able to explain why pulse checking is important during independent exercise       Understanding of Exercise Prescription  Yes       Intervention  Provide education, explanation, and written materials on patient's individual exercise prescription       Expected Outcomes  Short Term: Able to explain program exercise prescription;Long Term: Able to explain home exercise prescription to exercise independently          Exercise Goals Re-Evaluation : Exercise Goals Re-Evaluation    Row Name 05/26/18 4098 06/08/18 1614 06/09/18 1112 06/22/18 1544 07/06/18 1208     Exercise Goal Re-Evaluation   Exercise Goals Review  Increase Physical Activity;Increase Strength and Stamina;Able to understand and use rate of perceived exertion (RPE) scale;Knowledge and understanding of Target Heart Rate Range (THRR);Understanding of Exercise Prescription  Increase Physical Activity;Increase Strength and Stamina;Understanding  of Exercise Prescription  Increase Physical Activity;Increase Strength and Stamina;Understanding of Exercise Prescription;Able to understand and use rate of perceived exertion (RPE) scale;Knowledge and understanding of Target Heart Rate Range (THRR);Able to check pulse independently  Increase  Physical Activity;Increase Strength and Stamina;Understanding of Exercise Prescription  Increase Physical Activity;Increase Strength and Stamina;Understanding of Exercise Prescription   Comments  Reviewed RPE scale, THR and program prescription with pt today.  Pt voiced understanding and was given a copy of goals to take home.   Jaques is off to a good start in rehab.  He is already up level 2 on the T5 Nustep.  We review home exercise guidelines soon and continue to monitor his progress.   Reviewed home exercise with pt today.  Pt plans to use treadmill and gym at [facility] for exercise.  Reviewed THR, pulse, RPE, sign and symptoms, NTG use, and when to call 911 or MD.  Also discussed weather considerations and indoor options.  Pt voiced understanding.  Delonte has been doing well in rehab.  He is now up to level 4 on the T5 NuStep.  We will continue to monitor her progress.   Domanik continues to well in rehab.  He will be walking and going to [facility].  We will continue to monitor his progress at home.    Expected Outcomes  Short: Use RPE daily to regulate intensity. Long: Follow program prescription in THR.  Short: Review home exercise guidelines and increase workloads.  Long: Continue to increase strength and stamina.   Short: Continue to use treadmill on off days.  Long: Continue to build up to 30 min of exercise daily.   Short: Continue to increase workloads.  Long: Continue to increase stamina.   Short: Continue to walk at home.  Long: Continue to increase strength    Row Name 07/26/18 1325 08/15/18 1128 09/06/18 1102         Exercise Goal Re-Evaluation   Exercise Goals Review  Increase Physical Activity;Increase Strength and Stamina;Understanding of Exercise Prescription  Increase Physical Activity;Increase Strength and Stamina;Understanding of Exercise Prescription  Increase Physical Activity;Increase Strength and Stamina;Understanding of Exercise Prescription     Comments  Auguste has been doing well in  rehab.  [place] at [facility] which is about a mile each day.  He has also been working out in the garden. Overall, he is feeling good.  We will continue to monitor his progress.   Eldar has been doing well at home. [place]  He is maintaining his stamina, but eager to return to class.   Xaidyn has continued to do well at home.  [place] and he is doing some weights and stretching at home.  He is looking forward to returning to class.      Expected Outcomes  Short: Continue to walk daily  Long: Continue to stay active at home.   Short: Continue to walk daily.  Long: Continue to maintain stamina.   Short: Continue to walk daily.  Long: Continue to maintain stamina.         Discharge Exercise Prescription (Final Exercise Prescription Changes): Exercise Prescription Changes - 07/06/18 1200      Response to Exercise   Blood Pressure (Admit)  140/60    Blood Pressure (Exercise)  134/72    Blood Pressure (Exit)  126/60    Heart Rate (Admit)  68 bpm  Heart Rate (Exercise)  112 bpm    Heart Rate (Exit)  71 bpm    Rating of Perceived Exertion (Exercise)  13    Symptoms  none    Duration  Continue with 30 min of aerobic exercise without signs/symptoms of physical distress.    Intensity  THRR unchanged      Progression   Progression  Continue to progress workloads to maintain intensity without signs/symptoms of physical distress.    Average METs  3.84      Resistance Training   Training Prescription  Yes    Weight  3 lbs    Reps  10-15      Interval Training   Interval Training  No      Treadmill   MPH  3.4    Grade  0.5    Minutes  15    METs  3.84      REL-XR   Level  5    Minutes  15    METs  4.1      T5 Nustep   Level  4    Minutes  15    METs  3.1      Home Exercise Plan   Plans to continue exercise at  [employer] (comment)   [employer]  Add 3  additional days to program exercise sessions.    Initial Home Exercises Provided  06/09/18       Nutrition:  Target Goals: Understanding of nutrition guidelines, daily intake of sodium <1561m, cholesterol <2059m calories 30% from fat and 7% or less from saturated fats, daily to have 5 or more servings of fruits and vegetables.  Biometrics: Pre Biometrics - 05/16/18 1532      Pre Biometrics   Height  5' 5.1" (1.654 m)    Weight  154 lb (69.9 kg)    Waist Circumference  37 inches    Hip Circumference  36.5 inches    Waist to Hip Ratio  1.01 %    BMI (Calculated)  25.53    Single Leg Stand  18.1 seconds        Nutrition Therapy Plan and Nutrition Goals: Nutrition Therapy & Goals - 08/25/18 1422      Nutrition Therapy   Protein (specify units)  55g    Fiber  30 grams    Whole Grain Foods  3 servings    Saturated Fats  12 max. grams    Fruits and Vegetables  5 servings/day    Sodium  1.5 grams      Personal Nutrition Goals   Nutrition Goal  ST/LT: Heart Healthy diet and maintain weight.    Comments  [facility] but [facility]'t do much. Pt reports he is eating a good diet, big breakfast small lunch and good sized evening meal. B: 2 scrambled eggs a couple pieces of toast (mulitgrain or sometimes sourdough) and an orange and coffee. L: soup and some crackers or unsweet iced tea with pom juice. Part of cupcake. D: chicken and vegetable and potatoes and dessert. occasionally will get take out and will fix it at home. progresso soup. No salt when cooking; discussed salt content of the progresso soup and Na recommendations - he could still have the soup so long as he remains withing his NA goal for the day. Discussed My[employer] with pt. Changes made: walking more (1 mile per day and garden work), but no real dietary changes.  Intervention Plan   Intervention  Prescribe, educate and counsel regarding individualized specific dietary modifications aiming  towards targeted core components such as weight, hypertension, lipid management, diabetes, heart failure and other comorbidities.    Expected Outcomes  Short Term Goal: Understand basic principles of dietary content, such as calories, fat, sodium, cholesterol and nutrients.;Short Term Goal: A plan has been developed with personal nutrition goals set during dietitian appointment.;Long Term Goal: Adherence to prescribed nutrition plan.       Nutrition Assessments: Nutrition Assessments - 05/16/18 1306      MEDFICTS Scores   Pre Score  37       Nutrition Goals Re-Evaluation: Nutrition Goals Re-Evaluation    Riddle Name 06/09/18 1112 06/23/18 1004           Goals   Current Weight  -  161 lb (73 kg)      Nutrition Goal  Heart Healthy diet, low sodium  Heart Healthy diet and maintain weight.      Comment  Treyvion has been doing well with his diet.  They live in [place] and his wife watches his diet closely.  They eat a lot of fruit and vegetables.  He has been trying to eat more chicken and fish for lean meats.  He Is watching his salt intake too.  We will conitnue to monitor his diet.   Tarez wants to maintain his weight now. He was working [employer] and people had brought in unhealthy snacks and had a couple of donuts. He does watch his diet closely on most other days.      Expected Outcome  Short: Continue to eat healthy.  Long: Continue to watch sodium.   Short: watch sweets intake. Long: maintain weight.         Nutrition Goals Discharge (Final Nutrition Goals Re-Evaluation): Nutrition Goals Re-Evaluation - 06/23/18 1004      Goals   Current Weight  161 lb (73 kg)    Nutrition Goal  Heart Healthy diet and maintain weight.    Comment  Royer wants to maintain his weight now. He was working [employer] and people had brought in unhealthy snacks and had a couple of donuts. He does watch his diet closely on most other days.    Expected Outcome  Short: watch sweets intake. Long: maintain weight.        Psychosocial: Target Goals: Acknowledge presence or absence of significant depression and/or stress, maximize coping skills, provide positive support system. Participant is able to verbalize types and ability to use techniques and skills needed for reducing stress and depression.   Initial Review & Psychosocial Screening: Initial Psych Review & Screening - 05/16/18 1304      Initial Review   Current issues with  None Identified      Family Dynamics   Good Support System?  Yes   spouse and adult children   Comments  Quante and his wife have lived in [place] for about the last 6 years. They love it and enjoy the community setting.       Barriers   Psychosocial barriers to participate in program  The patient should benefit from training in stress management and relaxation.;There are no identifiable barriers or psychosocial needs.      Screening Interventions   Interventions  Encouraged to exercise;Program counselor consult;To provide support and resources with identified psychosocial needs;Provide feedback about the scores to participant    Expected Outcomes  Short Term goal: Utilizing psychosocial counselor, staff and  physician to assist with identification of specific Stressors or current issues interfering with healing process. Setting desired goal for each stressor or current issue identified.;Long Term Goal: Stressors or current issues are controlled or eliminated.;Short Term goal: Identification and review with participant of any Quality of Life or Depression concerns found by scoring the questionnaire.;Long Term goal: The participant improves quality of Life and PHQ9 Scores as seen by post scores and/or verbalization of changes       Quality of Life Scores:  Quality of Life - 05/16/18 1305      Quality of Life   Select  Quality of Life      Quality of Life Scores   Health/Function Pre  26.25 %    Socioeconomic Pre  27.5 %    Psych/Spiritual Pre  26.79 %    Family Pre   25.9 %    GLOBAL Pre  24.48 %      Scores of 19 and below usually indicate a poorer quality of life in these areas.  A difference of  2-3 points is a clinically meaningful difference.  A difference of 2-3 points in the total score of the Quality of Life Index has been associated with significant improvement in overall quality of life, self-image, physical symptoms, and general health in studies assessing change in quality of life.  PHQ-9: Recent Review Flowsheet Data    Depression screen [facility] 2/9 05/16/2018   Decreased Interest 0   Down, Depressed, Hopeless 0   PHQ - 2 Score 0   Altered sleeping 0   Tired, decreased energy 0   Change in appetite 0   Feeling bad or failure about yourself  0   Trouble concentrating 0   Moving slowly or fidgety/restless 0   Suicidal thoughts 0   PHQ-9 Score 0     Interpretation of Total Score  Total Score Depression Severity:  1-4 = Minimal depression, 5-9 = Mild depression, 10-14 = Moderate depression, 15-19 = Moderately severe depression, 20-27 = Severe depression   Psychosocial Evaluation and Intervention: Psychosocial Evaluation - 06/02/18 1046      Psychosocial Evaluation & Interventions   Interventions  Encouraged to exercise with the program and follow exercise prescription    Comments  Counselor met with Mr. Safley Jenny Reichmann) today for initial psychosocial evaluation.  He is an 84 year old who had a CABG x2 late December, 2019.  He has a strong support system with a spouse; (4) adult children and lives in a retirement community.  He has had prostate cancer surgery 20+_ years ago and reports Drs are keeping an "eye on his numbers."  He sleeps well and has a good appetite.  Martinez denies a history of depression or anxiety or any current symptoms and is typically in a positive mood.  He has goals to increase his stamina and strength and improve his muscle mass.  He will be followed by staff.      Expected Outcomes  Short:  Othmar will exercise with the  program to increase his stamina and strength and recover his muscle mass.  Long:  Farrel will continue to exercise to maintain his progress in this program.       Psychosocial Re-Evaluation: Psychosocial Re-Evaluation    Row Name 06/23/18 1001 06/30/18 1035 07/26/18 1321 08/15/18 1130 09/06/18 1103     Psychosocial Re-Evaluation   Current issues with  None Identified  None Identified  None Identified  Current Stress Concerns  Current Stress Concerns   Comments  Norvell states that he is in high spirits and has no stress or depression concerns.   Patient reports that he exercises at [facility] facility in addition to [employer].  He feels that staying active improves his mood and enjoys an active support system.  Abdelrahman is doing well at home.  He is getting out for daily walks to help with stressors and mood.  He continues to stay postive!  Dejean has been doing well.  However, his wife is currently in the hospital. She opened a scab in the shower and passed out from the blood loss (almost 2 pints).  She was in ICU for a few days, but now doing better and should be coming home today.  He has been impressed with talking to doctors about her care.  He will continue to walk to help himself   Graciano is doing well at home.  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  He did it 10 years ago and said it was a lot of fun!  They are both doing well and staying healthy at home.    Expected Outcomes  Short: continue [facility] to keep stress at a minimum. Long: Maintain a positive outlook on Life.  Short: continue exercie program; long: maintain and enjoy his support system.  Short: Continue to exercise daily for mood.  Long: Continue to stay postive.   Short: Continue to care for wife.  Long: Continue to walk daily for mood boost.   Short: Continue to get out to walk.  Long: Continue to practice self care.    Interventions  Encouraged to attend Cardiac Rehabilitation for the exercise  -  Encouraged to attend  Cardiac Rehabilitation for the exercise  Encouraged to attend Cardiac Rehabilitation for the exercise  Encouraged to attend Cardiac Rehabilitation for the exercise   Continue Psychosocial Services   Follow up required by staff  Follow up required by staff  Follow up required by staff  Follow up required by staff  Follow up required by staff      Psychosocial Discharge (Final Psychosocial Re-Evaluation): Psychosocial Re-Evaluation - 09/06/18 1103      Psychosocial Re-Evaluation   Current issues with  Current Stress Concerns    Comments  Susumu is doing well at home.  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  He did it 10 years ago and said it was a lot of fun!  They are both doing well and staying healthy at home.     Expected Outcomes  Short: Continue to get out to walk.  Long: Continue to practice self care.     Interventions  Encouraged to attend Cardiac Rehabilitation for the exercise    Continue Psychosocial Services   Follow up required by staff       Vocational Rehabilitation: Provide vocational rehab assistance to qualifying candidates.   Vocational Rehab Evaluation & Intervention: Vocational Rehab - 05/16/18 1304      Initial Vocational Rehab Evaluation & Intervention   Assessment shows need for Vocational Rehabilitation  No       Education: Education Goals: Education classes will be provided on a variety of topics geared toward better understanding of heart health and risk factor modification. Participant will state understanding/return demonstration of topics presented as noted by education test scores.  Learning Barriers/Preferences: Learning Barriers/Preferences - 05/16/18 1257      Learning Barriers/Preferences   Learning Barriers  None    Learning Preferences  None  Education Topics:  AED/CPR: - Group verbal and written instruction with the use of models to demonstrate the basic use of the AED with the basic ABC's of resuscitation.    Cardiac Rehab from 06/30/2018 in [facility] Cardiac and Pulmonary Rehab  Date  06/14/18  Educator  SB  Instruction Review Code  1- Verbalizes Understanding      General Nutrition Guidelines/Fats and Fiber: -Group instruction provided by verbal, written material, models and posters to present the general guidelines for heart healthy nutrition. Gives an explanation and review of dietary fats and fiber.   Cardiac Rehab from 06/30/2018 in [facility] Cardiac and Pulmonary Rehab  Date  06/28/18  Educator  [facility]  Instruction Review Code  1- Verbalizes Understanding      Controlling Sodium/Reading Food Labels: -Group verbal and written material supporting the discussion of sodium use in heart healthy nutrition. Review and explanation with models, verbal and written materials for utilization of the food label.   Cardiac Rehab from 06/30/2018 in [facility] Cardiac and Pulmonary Rehab  Date  06/30/18  Educator  [facility]  Instruction Review Code  1- Verbalizes Understanding      Exercise Physiology & General Exercise Guidelines: - Group verbal and written instruction with models to review the exercise physiology of the cardiovascular system and associated critical values. Provides general exercise guidelines with specific guidelines to those with heart or lung disease.    Aerobic Exercise & Resistance Training: - Gives group verbal and written instruction on the various components of exercise. Focuses on aerobic and resistive training programs and the benefits of this training and how to safely progress through these programs..   Flexibility, Balance, Mind/Body Relaxation: Provides group verbal/written instruction on the benefits of flexibility and balance training, including mind/body exercise modes such as yoga, pilates and tai chi.  Demonstration and skill practice provided.   Stress and Anxiety: - Provides group verbal and written instruction about the health risks of elevated stress and causes of high stress.   Discuss the correlation between heart/lung disease and anxiety and treatment options. Review healthy ways to manage with stress and anxiety.   Cardiac Rehab from 06/30/2018 in [facility] Cardiac and Pulmonary Rehab  Date  06/07/18  Educator  [facility]  Instruction Review Code  1- Verbalizes Understanding      Depression: - Provides group verbal and written instruction on the correlation between heart/lung disease and depressed mood, treatment options, and the stigmas associated with seeking treatment.   Anatomy & Physiology of the Heart: - Group verbal and written instruction and models provide basic cardiac anatomy and physiology, with the coronary electrical and arterial systems. Review of Valvular disease and Heart Failure   Cardiac Rehab from 06/30/2018 in [facility] Cardiac and Pulmonary Rehab  Date  05/26/18  Educator  CE  Instruction Review Code  1- Verbalizes Understanding      Cardiac Procedures: - Group verbal and written instruction to review commonly prescribed medications for heart disease. Reviews the medication, class of the drug, and side effects. Includes the steps to properly store meds and maintain the prescription regimen. (beta blockers and nitrates)   Cardiac Rehab from 06/30/2018 in [facility] Cardiac and Pulmonary Rehab  Date  06/09/18  Educator  CE  Instruction Review Code  1- Verbalizes Understanding      Cardiac Medications I: - Group verbal and written instruction to review commonly prescribed medications for heart disease. Reviews the medication, class of the drug, and side effects. Includes the steps to properly store meds and maintain  the prescription regimen.   Cardiac Rehab from 06/30/2018 in [facility] Cardiac and Pulmonary Rehab  Date  05/31/18  Educator  SB  Instruction Review Code  1- Verbalizes Understanding      Cardiac Medications II: -Group verbal and written instruction to review commonly prescribed medications for heart disease. Reviews the medication, class of the  drug, and side effects. (all other drug classes)   Cardiac Rehab from 06/30/2018 in [facility] Cardiac and Pulmonary Rehab  Date  06/23/18  Educator  CE  Instruction Review Code  1- Verbalizes Understanding       Go Sex-Intimacy & Heart Disease, Get SMART - Goal Setting: - Group verbal and written instruction through game format to discuss heart disease and the return to sexual intimacy. Provides group verbal and written material to discuss and apply goal setting through the application of the S.M.A.R.T. Method.   Cardiac Rehab from 06/30/2018 in [facility] Cardiac and Pulmonary Rehab  Date  06/09/18  Educator  CE  Instruction Review Code  1- Verbalizes Understanding      Other Matters of the Heart: - Provides group verbal, written materials and models to describe Stable Angina and Peripheral Artery. Includes description of the disease process and treatment options available to the cardiac patient.   Cardiac Rehab from 06/30/2018 in [facility] Cardiac and Pulmonary Rehab  Date  05/26/18  Educator  CE  Instruction Review Code  1- Verbalizes Understanding      Exercise & Equipment Safety: - Individual verbal instruction and demonstration of equipment use and safety with use of the equipment.   Cardiac Rehab from 06/30/2018 in [facility] Cardiac and Pulmonary Rehab  Date  05/16/18  Educator  [facility]  Instruction Review Code  1- Verbalizes Understanding      Infection Prevention: - Provides verbal and written material to individual with discussion of infection control including proper hand washing and proper equipment cleaning during exercise session.   Cardiac Rehab from 06/30/2018 in [facility] Cardiac and Pulmonary Rehab  Date  05/16/18  Educator  [facility]  Instruction Review Code  1- Verbalizes Understanding      Falls Prevention: - Provides verbal and written material to individual with discussion of falls prevention and safety.   Cardiac Rehab from 06/30/2018 in [facility] Cardiac and Pulmonary Rehab  Date  05/16/18   Educator  [facility]  Instruction Review Code  1- Verbalizes Understanding      Diabetes: - Individual verbal and written instruction to review signs/symptoms of diabetes, desired ranges of glucose level fasting, after meals and with exercise. Acknowledge that pre and post exercise glucose checks will be done for 3 sessions at entry of program.   Know Your Numbers and Risk Factors: -Group verbal and written instruction about important numbers in your health.  Discussion of what are risk factors and how they play a role in the disease process.  Review of Cholesterol, Blood Pressure, Diabetes, and BMI and the role they play in your overall health.   Cardiac Rehab from 06/30/2018 in [facility] Cardiac and Pulmonary Rehab  Date  06/23/18  Educator  CE  Instruction Review Code  1- Verbalizes Understanding      Sleep Hygiene: -Provides group verbal and written instruction about how sleep can affect your health.  Define sleep hygiene, discuss sleep cycles and impact of sleep habits. Review good sleep hygiene tips.    Other: -Provides group and verbal instruction on various topics (see comments)   Knowledge Questionnaire Score: Knowledge Questionnaire Score - 05/16/18 1257  Knowledge Questionnaire Score   Pre Score  24/26   correct answers reviewed with Brahim. Focus on Nutrition      Core Components/Risk Factors/Patient Goals at Admission: Personal Goals and Risk Factors at Admission - 05/16/18 1255      Core Components/Risk Factors/Patient Goals on Admission    Weight Management  Yes;Weight Gain    Intervention  Weight Management: Develop a combined nutrition and exercise program designed to reach desired caloric intake, while maintaining appropriate intake of nutrient and fiber, sodium and fats, and appropriate energy expenditure required for the weight goal.;Weight Management: Provide education and appropriate resources to help participant work on and attain dietary goals.    Admit Weight   154 lb (69.9 kg)    Goal Weight: Short Term  160 lb (72.6 kg)    Expected Outcomes  Short Term: Continue to assess and modify interventions until short term weight is achieved;Long Term: Adherence to nutrition and physical activity/exercise program aimed toward attainment of established weight goal;Weight Maintenance: Understanding of the daily nutrition guidelines, which includes 25-35% calories from fat, 7% or less cal from saturated fats, less than 265m cholesterol, less than 1.5gm of sodium, & 5 or more servings of fruits and vegetables daily;Understanding recommendations for meals to include 15-35% energy as protein, 25-35% energy from fat, 35-60% energy from carbohydrates, less than 2074mof dietary cholesterol, 20-35 gm of total fiber daily;Understanding of distribution of calorie intake throughout the day with the consumption of 4-5 meals/snacks;Weight Gain: Understanding of general recommendations for a high calorie, high protein meal plan that promotes weight gain by distributing calorie intake throughout the day with the consumption for 4-5 meals, snacks, and/or supplements    Hypertension  Yes    Intervention  Provide education on lifestyle modifcations including regular physical activity/exercise, weight management, moderate sodium restriction and increased consumption of fresh fruit, vegetables, and low fat dairy, alcohol moderation, and smoking cessation.;Monitor prescription use compliance.    Expected Outcomes  Short Term: Continued assessment and intervention until BP is < 140/9037mG in hypertensive participants. < 130/37m20m in hypertensive participants with diabetes, heart failure or chronic kidney disease.;Long Term: Maintenance of blood pressure at goal levels.    Lipids  Yes    Intervention  Provide education and support for participant on nutrition & aerobic/resistive exercise along with prescribed medications to achieve LDL <70mg37mL >40mg.76mExpected Outcomes  Short Term:  Participant states understanding of desired cholesterol values and is compliant with medications prescribed. Participant is following exercise prescription and nutrition guidelines.;Long Term: Cholesterol controlled with medications as prescribed, with individualized exercise RX and with personalized nutrition plan. Value goals: LDL < 70mg, 65m> 40 mg.       Core Components/Risk Factors/Patient Goals Review:  Goals and Risk Factor Review    Row Name 06/09/18 1116 06/23/18 1006 07/26/18 1322 08/15/18 1144 09/06/18 1107     Core Components/Risk Factors/Patient Goals Review   Personal Goals Review  Weight Management/Obesity;Hypertension;Lipids  Weight Management/Obesity;Hypertension;Lipids  Weight Management/Obesity;Hypertension;Lipids  Weight Management/Obesity;Hypertension;Lipids  Weight Management/Obesity;Hypertension;Lipids   Review  Ranon haRusselen able to gain some weight back which has made him happy.  He is doing well with his blood pressures and checks them routinely at home.  He normally gets around 120-130s/70s.  He is doing well on his medications and sees his renal doctor tomorrow about those medications.   Johns blood pressure was a little higher today. He is checking his blood pressure at home and it varies. He  is in the 140's to 732'K for systolic. He is not sure why he has been jumping up and down with his pressure. He is taking a statin to help his lipids. Patient has started taking lipitor after his heart issues. He does not want to take a statin but he knows he has to consult his doctor about it. His kidney doctor wants him to be on more medications but his cardiologist does not want him on those certain medications.  Jacorion has been doing well at home.  His weight has been pretty steady and only varies up and down a pound.  His blood pressures have been good.  He has a lab appointment this week for a doctor's visit next week with Dr. Ginette Pitman.  He is expecting a good report.  Gaylen has been  doing well at home.  His weight has been steady.  He did call the doctor about his pressures.  At his last appointment, they had changed his catapress and thenhis blood pressures went way [place] (160/89).  He called an got his catapress restarted.    Kinston has lost some weight!!  He is down to 153 lbs at home.  His pants and shorts are all getting to be too big for him.  He's okay with that problem, just wishes it was safe to go get some new ones.  He is also doing well back on his catapress and pressures have improved.    Expected Outcomes  Short: Talk to doctor about renal meds tomorrow.  Long: Continue to maintain weight now.   Short: talk to cardiologist about his lipid medication. Long: maintain medications and take medicaions as needed.  Short: Get good numbers from lab and report from doctor.  Long: Continue to monitor weight and pressures.   Short: Continue to monitor blood pressure closely.  Long: Continue to monitor risk factors.   Short: Continue to work on weight loss.  Long: Continue to monitor blood pressures.       Core Components/Risk Factors/Patient Goals at Discharge (Final Review):  Goals and Risk Factor Review - 09/06/18 1107      Core Components/Risk Factors/Patient Goals Review   Personal Goals Review  Weight Management/Obesity;Hypertension;Lipids    Review  Shloimy has lost some weight!!  He is down to 153 lbs at home.  His pants and shorts are all getting to be too big for him.  He's okay with that problem, just wishes it was safe to go get some new ones.  He is also doing well back on his catapress and pressures have improved.     Expected Outcomes  Short: Continue to work on weight loss.  Long: Continue to monitor blood pressures.        ITP Comments: ITP Comments    Row Name 05/16/18 1236 05/18/18 1356 06/15/18 0616 07/06/18 1207 07/13/18 1222   ITP Comments  Med Review completed. Initial ITP created. Diagnosis can be found in [facility] 12/17  30 day review completed. ITP sent to Dr.  Emily Filbert, Medical Director of Cardiac Rehab. Continue with ITP unless changes are made by physician.  30 day review. Continue with ITP unless directed changes by Medical Director chart review.  Our program is currently closed due to COVID-19.  We are communicating with patient via phone calls and emails.    30 day review. Continue with ITP unless directed changes by Medical Director chart review.   Shaw Name 10/24/18 [phone]  ITP Comments  Contacted [facility] to return to rehab.  He does not wish to return to the gym and will discharge at this time.          Comments: Discharge ITP

## 2018-10-24 NOTE — Progress Notes (Signed)
Discharge Progress Report  Patient Details  Name: Timothy Goodwin MRN: [id number] Date of Birth: 1934/10/13 Referring Provider:     Cardiac Rehab from 05/16/2018 in [facility]  Referring Provider  Lujean Amel MD       Number of Visits: 10/36  Reason for Discharge:  Patient reached a stable level of exercise. Patient independent in their exercise.  Smoking History:  Social History   Tobacco Use  Smoking Status Never Smoker  Smokeless Tobacco Never Used    Diagnosis:  S/P CABG x 2  ADL UCSD:   Initial Exercise Prescription: Initial Exercise Prescription - 05/16/18 1500      Date of Initial Exercise RX and Referring Provider   Date  05/16/18    Referring Provider  Lujean Amel MD      Treadmill   MPH  2.4    Grade  0.5    Minutes  15    METs  2.84      REL-XR   Level  1    Speed  50    Minutes  15    METs  2.8      T5 Nustep   Level  1    SPM  80    Minutes  15    METs  2.5      Prescription Details   Frequency (times per week)  2    Duration  Progress to 30 minutes of continuous aerobic without signs/symptoms of physical distress      Intensity   THRR 40-80% of Max Heartrate  100-126    Ratings of Perceived Exertion  11-13    Perceived Dyspnea  0-4      Progression   Progression  Continue to progress workloads to maintain intensity without signs/symptoms of physical distress.      Resistance Training   Training Prescription  Yes    Weight  3 lbs    Reps  10-15       Discharge Exercise Prescription (Final Exercise Prescription Changes): Exercise Prescription Changes - 07/06/18 1200      Response to Exercise   Blood Pressure (Admit)  140/60    Blood Pressure (Exercise)  134/72    Blood Pressure (Exit)  126/60    Heart Rate (Admit)  68 bpm    Heart Rate (Exercise)  112 bpm    Heart Rate (Exit)  71 bpm    Rating of Perceived Exertion (Exercise)  13    Symptoms  none    Duration  Continue with 30 min of  aerobic exercise without signs/symptoms of physical distress.    Intensity  THRR unchanged      Progression   Progression  Continue to progress workloads to maintain intensity without signs/symptoms of physical distress.    Average METs  3.84      Resistance Training   Training Prescription  Yes    Weight  3 lbs    Reps  10-15      Interval Training   Interval Training  No      Treadmill   MPH  3.4    Grade  0.5    Minutes  15    METs  3.84      REL-XR   Level  5    Minutes  15    METs  4.1      T5 Nustep   Level  4    Minutes  15    METs  3.1  Home Exercise Plan   Plans to continue exercise at  [facility] (comment)   [place] [facility]   Frequency  Add 3 additional days to program exercise sessions.    Initial Home Exercises Provided  06/09/18       Functional Capacity: 6 Minute Walk    Row Name 05/16/18 1528         6 Minute Walk   Phase  Initial     Distance  1480 feet     Walk Time  6 minutes     # of Rest Breaks  0     MPH  2.8     METS  2.85     RPE  13     VO2 Peak  9.96     Symptoms  No     Resting HR  74 bpm     Resting BP  146/74     Resting Oxygen Saturation   99 %     Exercise Oxygen Saturation  during 6 min walk  100 %     Max Ex. HR  98 bpm     Max Ex. BP  156/64     2 Minute Post BP  146/72        Psychological, QOL, Others - Outcomes: PHQ 2/9: Depression screen PHQ 2/9 05/16/2018  Decreased Interest 0  Down, Depressed, Hopeless 0  PHQ - 2 Score 0  Altered sleeping 0  Tired, decreased energy 0  Change in appetite 0  Feeling bad or failure about yourself  0  Trouble concentrating 0  Moving slowly or fidgety/restless 0  Suicidal thoughts 0  PHQ-9 Score 0    Quality of Life: Quality of Life - 05/16/18 1305      Quality of Life   Select  Quality of Life      Quality of Life Scores   Health/Function Pre  26.25 %    Socioeconomic Pre  27.5 %    Psych/Spiritual Pre  26.79 %    Family Pre  25.9 %    GLOBAL Pre   24.48 %       Personal Goals: Goals established at orientation with interventions provided to work toward goal. Personal Goals and Risk Factors at Admission - 05/16/18 1255      Core Components/Risk Factors/Patient Goals on Admission    Weight Management  Yes;Weight Gain    Intervention  Weight Management: Develop a combined nutrition and exercise program designed to reach desired caloric intake, while maintaining appropriate intake of nutrient and fiber, sodium and fats, and appropriate energy expenditure required for the weight goal.;Weight Management: Provide education and appropriate resources to help participant work on and attain dietary goals.    Admit Weight  154 lb (69.9 kg)    Goal Weight: Short Term  160 lb (72.6 kg)    Expected Outcomes  Short Term: Continue to assess and modify interventions until short term weight is achieved;Long Term: Adherence to nutrition and physical activity/exercise program aimed toward attainment of established weight goal;Weight Maintenance: Understanding of the daily nutrition guidelines, which includes 25-35% calories from fat, 7% or less cal from saturated fats, less than 200mg  cholesterol, less than 1.5gm of sodium, & 5 or more servings of fruits and vegetables daily;Understanding recommendations for meals to include 15-35% energy as protein, 25-35% energy from fat, 35-60% energy from carbohydrates, less than 200mg  of dietary cholesterol, 20-35 gm of total fiber daily;Understanding of distribution of calorie intake throughout the day with the consumption of  4-5 meals/snacks;Weight Gain: Understanding of general recommendations for a high calorie, high protein meal plan that promotes weight gain by distributing calorie intake throughout the day with the consumption for 4-5 meals, snacks, and/or supplements    Hypertension  Yes    Intervention  Provide education on lifestyle modifcations including regular physical activity/exercise, weight management, moderate  sodium restriction and increased consumption of fresh fruit, vegetables, and low fat dairy, alcohol moderation, and smoking cessation.;Monitor prescription use compliance.    Expected Outcomes  Short Term: Continued assessment and intervention until BP is < 140/44mm HG in hypertensive participants. < 130/65mm HG in hypertensive participants with diabetes, heart failure or chronic kidney disease.;Long Term: Maintenance of blood pressure at goal levels.    Lipids  Yes    Intervention  Provide education and support for participant on nutrition & aerobic/resistive exercise along with prescribed medications to achieve LDL 70mg , HDL >40mg .    Expected Outcomes  Short Term: Participant states understanding of desired cholesterol values and is compliant with medications prescribed. Participant is following exercise prescription and nutrition guidelines.;Long Term: Cholesterol controlled with medications as prescribed, with individualized exercise RX and with personalized nutrition plan. Value goals: LDL < 70mg , HDL > 40 mg.        Personal Goals Discharge: Goals and Risk Factor Review    Row Name 06/09/18 1116 06/23/18 1006 07/26/18 1322 08/15/18 1144 09/06/18 1107     Core Components/Risk Factors/Patient Goals Review   Personal Goals Review  Weight Management/Obesity;Hypertension;Lipids  Weight Management/Obesity;Hypertension;Lipids  Weight Management/Obesity;Hypertension;Lipids  Weight Management/Obesity;Hypertension;Lipids  Weight Management/Obesity;Hypertension;Lipids   Review  Eyden has been able to gain some weight back which has made him happy.  He is doing well with his blood pressures and checks them routinely at home.  He normally gets around 120-130s/70s.  He is doing well on his medications and sees his renal doctor tomorrow about those medications.   Johns blood pressure was a little higher today. He is checking his blood pressure at home and it varies. He is in the 140's to 409'W for systolic. He  is not sure why he has been jumping up and down with his pressure. He is taking a statin to help his lipids. Patient has started taking lipitor after his heart issues. He does not want to take a statin but he knows he has to consult his doctor about it. His kidney doctor wants him to be on more medications but his cardiologist does not want him on those certain medications.  My has been doing well at home.  His weight has been pretty steady and only varies up and down a pound.  His blood pressures have been good.  He has a lab appointment this week for a doctor's visit next week with Dr. Ginette Pitman.  He is expecting a good report.  Dakai has been doing well at home.  His weight has been steady.  He did call the doctor about his pressures.  At his last appointment, they had changed his catapress and thenhis blood pressures went way [place] (160/89).  He called an got his catapress restarted.    Walden has lost some weight!!  He is down to 153 lbs at home.  His pants and shorts are all getting to be too big for him.  He's okay with that problem, just wishes it was safe to go get some new ones.  He is also doing well back on his catapress and pressures have improved.    Expected Outcomes  Short:  Talk to doctor about renal meds tomorrow.  Long: Continue to maintain weight now.   Short: talk to cardiologist about his lipid medication. Long: maintain medications and take medicaions as needed.  Short: Get good numbers from lab and report from doctor.  Long: Continue to monitor weight and pressures.   Short: Continue to monitor blood pressure closely.  Long: Continue to monitor risk factors.   Short: Continue to work on weight loss.  Long: Continue to monitor blood pressures.       Exercise Goals and Review: Exercise Goals    Row Name 05/16/18 1532             Exercise Goals   Increase Physical Activity  Yes       Intervention  Provide advice, education, support and counseling about physical activity/exercise  needs.;Develop an individualized exercise prescription for aerobic and resistive training based on initial evaluation findings, risk stratification, comorbidities and participant's personal goals.       Expected Outcomes  Short Term: Attend rehab on a regular basis to increase amount of physical activity.;Long Term: Add in home exercise to make exercise part of routine and to increase amount of physical activity.;Long Term: Exercising regularly at least 3-5 days a week.       Increase Strength and Stamina  Yes       Intervention  Develop an individualized exercise prescription for aerobic and resistive training based on initial evaluation findings, risk stratification, comorbidities and participant's personal goals.;Provide advice, education, support and counseling about physical activity/exercise needs.       Expected Outcomes  Short Term: Increase workloads from initial exercise prescription for resistance, speed, and METs.;Short Term: Perform resistance training exercises routinely during rehab and add in resistance training at home;Long Term: Improve cardiorespiratory fitness, muscular endurance and strength as measured by increased METs and functional capacity (6MWT)       Able to understand and use rate of perceived exertion (RPE) scale  Yes       Intervention  Provide education and explanation on how to use RPE scale       Expected Outcomes  Short Term: Able to use RPE daily in rehab to express subjective intensity level;Long Term:  Able to use RPE to guide intensity level when exercising independently       Knowledge and understanding of Target Heart Rate Range (THRR)  Yes       Intervention  Provide education and explanation of THRR including how the numbers were predicted and where they are located for reference       Expected Outcomes  Short Term: Able to state/look up THRR;Short Term: Able to use daily as guideline for intensity in rehab;Long Term: Able to use THRR to govern intensity when  exercising independently       Able to check pulse independently  Yes       Intervention  Provide education and demonstration on how to check pulse in carotid and radial arteries.;Review the importance of being able to check your own pulse for safety during independent exercise       Expected Outcomes  Long Term: Able to check pulse independently and accurately;Short Term: Able to explain why pulse checking is important during independent exercise       Understanding of Exercise Prescription  Yes       Intervention  Provide education, explanation, and written materials on patient's individual exercise prescription       Expected Outcomes  Short Term: Able to explain program  exercise prescription;Long Term: Able to explain home exercise prescription to exercise independently          Exercise Goals Re-Evaluation: Exercise Goals Re-Evaluation    Row Name 05/26/18 0254 06/08/18 1614 06/09/18 1112 06/22/18 1544 07/06/18 1208     Exercise Goal Re-Evaluation   Exercise Goals Review  Increase Physical Activity;Increase Strength and Stamina;Able to understand and use rate of perceived exertion (RPE) scale;Knowledge and understanding of Target Heart Rate Range (THRR);Understanding of Exercise Prescription  Increase Physical Activity;Increase Strength and Stamina;Understanding of Exercise Prescription  Increase Physical Activity;Increase Strength and Stamina;Understanding of Exercise Prescription;Able to understand and use rate of perceived exertion (RPE) scale;Knowledge and understanding of Target Heart Rate Range (THRR);Able to check pulse independently  Increase Physical Activity;Increase Strength and Stamina;Understanding of Exercise Prescription  Increase Physical Activity;Increase Strength and Stamina;Understanding of Exercise Prescription   Comments  Reviewed RPE scale, THR and program prescription with pt today.  Pt voiced understanding and was given a copy of goals to take home.   Jamorion is off to a  good start in rehab.  He is already up level 2 on the T5 Nustep.  We review home exercise guidelines soon and continue to monitor his progress.   Reviewed home exercise with pt today.  Pt plans to use treadmill and gym at [facility] for exercise.  Reviewed THR, pulse, RPE, sign and symptoms, NTG use, and when to call 911 or MD.  Also discussed weather considerations and indoor options.  Pt voiced understanding.  Josia has been doing well in rehab.  He is now up to level 4 on the T5 NuStep.  We will continue to monitor her progress.   Kenney continues to well in rehab.  He will be walking and going to [facility].  We will continue to monitor his progress at home.    Expected Outcomes  Short: Use RPE daily to regulate intensity. Long: Follow program prescription in THR.  Short: Review home exercise guidelines and increase workloads.  Long: Continue to increase strength and stamina.   Short: Continue to use treadmill on off days.  Long: Continue to build up to 30 min of exercise daily.   Short: Continue to increase workloads.  Long: Continue to increase stamina.   Short: Continue to walk at home.  Long: Continue to increase strength    Row Name 07/26/18 1325 08/15/18 1128 09/06/18 1102         Exercise Goal Re-Evaluation   Exercise Goals Review  Increase Physical Activity;Increase Strength and Stamina;Understanding of Exercise Prescription  Increase Physical Activity;Increase Strength and Stamina;Understanding of Exercise Prescription  Increase Physical Activity;Increase Strength and Stamina;Understanding of Exercise Prescription     Comments  Yacoub has been doing well in rehab.  [place] [facility] which is about a mile each day.  He has also been working out in the garden. Overall, he is feeling good.  We will continue to monitor his progress.   Ewan has been doing well at home. [place]  He is maintaining his stamina, but eager to return to  class.   Ryheem has continued to do well at home.  [place] and he is doing some weights and stretching at home.  He is looking forward to returning to class.      Expected Outcomes  Short: Continue to walk daily  Long: Continue to stay active at home.  Short: Continue to walk daily.  Long: Continue to maintain stamina.   Short: Continue to walk daily.  Long: Continue to maintain stamina.         Nutrition & Weight - Outcomes: Pre Biometrics - 05/16/18 1532      Pre Biometrics   Height  5' 5.1" (1.654 m)    Weight  154 lb (69.9 kg)    Waist Circumference  37 inches    Hip Circumference  36.5 inches    Waist to Hip Ratio  1.01 %    BMI (Calculated)  25.53    Single Leg Stand  18.1 seconds        Nutrition: Nutrition Therapy & Goals - 08/25/18 1422      Nutrition Therapy   Protein (specify units)  55g    Fiber  30 grams    Whole Grain Foods  3 servings    Saturated Fats  12 max. grams    Fruits and Vegetables  5 servings/day    Sodium  1.5 grams      Personal Nutrition Goals   Nutrition Goal  ST/LT: Heart Healthy diet and maintain weight.    Comments  lives at [facility], tries to walk each day, but can't do much. Pt reports he is eating a good diet, big breakfast small lunch and good sized evening meal. B: 2 scrambled eggs a couple pieces of toast (mulitgrain or sometimes sourdough) and an orange and coffee. L: soup and some crackers or unsweet iced tea with pom juice. Part of cupcake. D: chicken and vegetable and potatoes and dessert. occasionally will get take out and will fix it at home. progresso soup. No salt when cooking; discussed salt content of the progresso soup and Na recommendations - he could still have the soup so long as he remains withing his NA goal for the day. Discussed MyPlate with pt. Changes made: walking more (1 mile per day and garden work), but no real dietary changes.        Intervention Plan   Intervention  Prescribe,  educate and counsel regarding individualized specific dietary modifications aiming towards targeted core components such as weight, hypertension, lipid management, diabetes, heart failure and other comorbidities.    Expected Outcomes  Short Term Goal: Understand basic principles of dietary content, such as calories, fat, sodium, cholesterol and nutrients.;Short Term Goal: A plan has been developed with personal nutrition goals set during dietitian appointment.;Long Term Goal: Adherence to prescribed nutrition plan.       Nutrition Discharge: Nutrition Assessments - 05/16/18 1306      MEDFICTS Scores   Pre Score  37       Education Questionnaire Score: Knowledge Questionnaire Score - 05/16/18 1257      Knowledge Questionnaire Score   Pre Score  24/26   correct answers reviewed with Beecher. Focus on Nutrition      Goals reviewed with patient; copy given to patient.

## 2018-11-04 ENCOUNTER — Other Ambulatory Visit: Payer: Self-pay

## 2018-11-04 DIAGNOSIS — Z20822 Contact with and (suspected) exposure to covid-19: Secondary | ICD-10-CM

## 2018-11-09 LAB — NOVEL CORONAVIRUS, NAA: SARS-CoV-2, NAA: NOT DETECTED

## 2019-09-28 ENCOUNTER — Other Ambulatory Visit: Payer: Self-pay

## 2019-09-28 ENCOUNTER — Ambulatory Visit (INDEPENDENT_AMBULATORY_CARE_PROVIDER_SITE_OTHER): Payer: Medicare PPO | Admitting: Dermatology

## 2019-09-28 DIAGNOSIS — L570 Actinic keratosis: Secondary | ICD-10-CM

## 2019-09-28 DIAGNOSIS — L578 Other skin changes due to chronic exposure to nonionizing radiation: Secondary | ICD-10-CM | POA: Diagnosis not present

## 2019-09-28 NOTE — Progress Notes (Signed)
   Follow-Up Visit   Subjective  Timothy Goodwin is a 85 y.o. male who presents for the following: Actinic Keratosis (recheck for any new or persistent AK's on the face and scalp).  The following portions of the chart were reviewed this encounter and updated as appropriate:  Tobacco  Allergies  Meds  Problems  Med Hx  Surg Hx  Fam Hx     Review of Systems:  No other skin or systemic complaints except as noted in HPI or Assessment and Plan.  Objective  Well appearing patient in no apparent distress; mood and affect are within normal limits.  A focused examination was performed including the face and scalp . Relevant physical exam findings are noted in the Assessment and Plan.  Objective  face x 15, scalp x 1 (16): Erythematous thin papules/macules with gritty scale.   Assessment & Plan  AK (actinic keratosis) (16) face x 15, scalp x 1  Recommend PDT on the face in one month   Destruction of lesion - face x 15, scalp x 1 Complexity: simple   Destruction method: cryotherapy   Informed consent: discussed and consent obtained   Timeout:  patient name, date of birth, surgical site, and procedure verified Lesion destroyed using liquid nitrogen: Yes   Region frozen until ice ball extended beyond lesion: Yes   Outcome: patient tolerated procedure well with no complications   Post-procedure details: wound care instructions given    Actinic Damage - diffuse scaly erythematous macules with underlying dyspigmentation - Recommend daily broad spectrum sunscreen SPF 30+ to sun-exposed areas, reapply every 2 hours as needed.  - Call for new or changing lesions.   Return in about 6 months (around 03/29/2020) for AK follow up; 1 month for PDT of the face .  Luther Redo, CMA, am acting as scribe for Sarina Ser, MD .  Documentation: I have reviewed the above documentation for accuracy and completeness, and I agree with the above.  Sarina Ser, MD

## 2019-10-02 ENCOUNTER — Encounter: Payer: Self-pay | Admitting: Dermatology

## 2019-10-25 IMAGING — CR DG CHEST 2V
1 series · 2 of 2 positions shown · non-contrast
Comparison: 12/25/2013 chest radiograph

CLINICAL DATA: 83 y/o M; intermittent, nonradiating, central chest
pain.

EXAM:
CHEST - 2 VIEW

[Series 1: dg chest 2 view · 0.14mm/px · 2 of 2 slices shown]
[im 1/2]
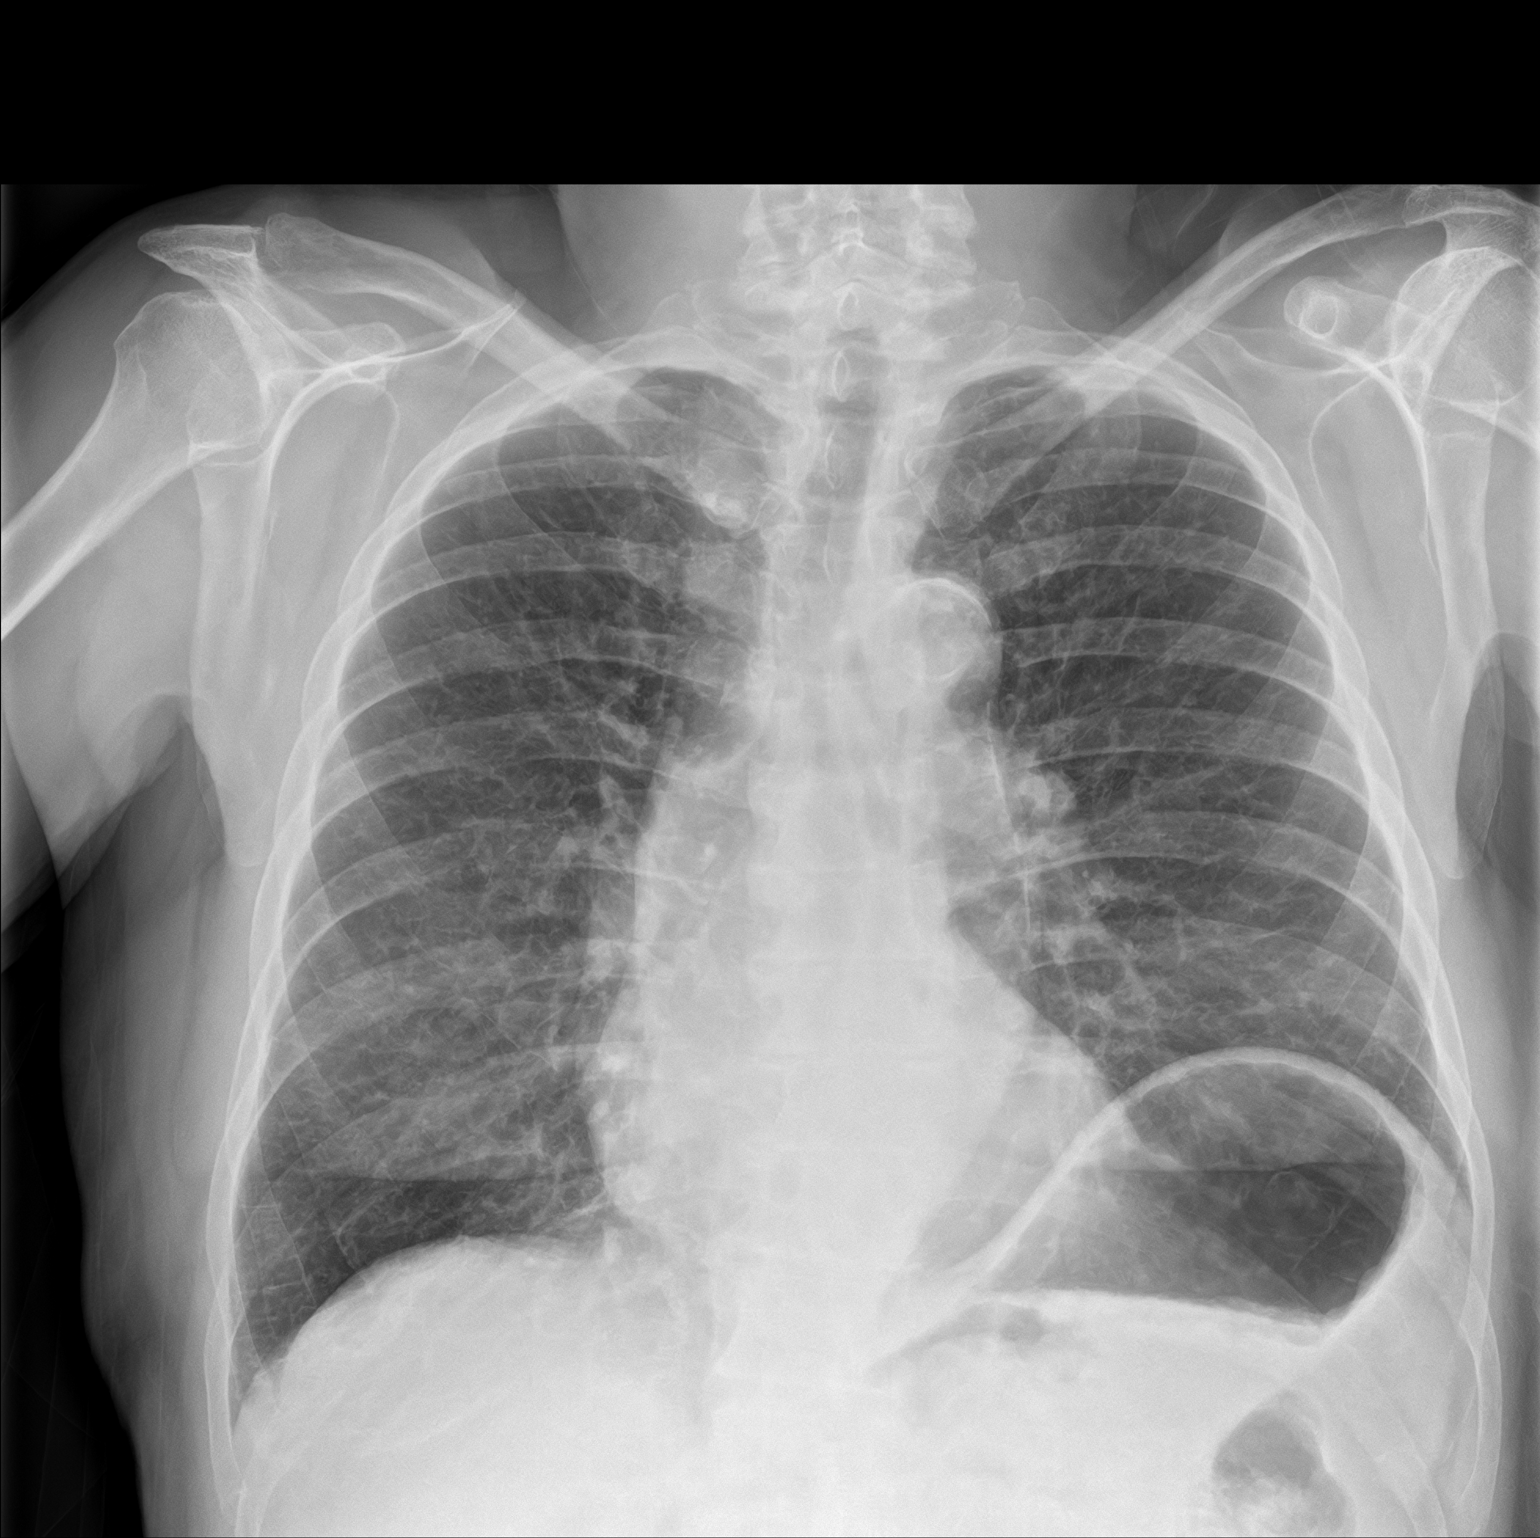
[im 2/2]
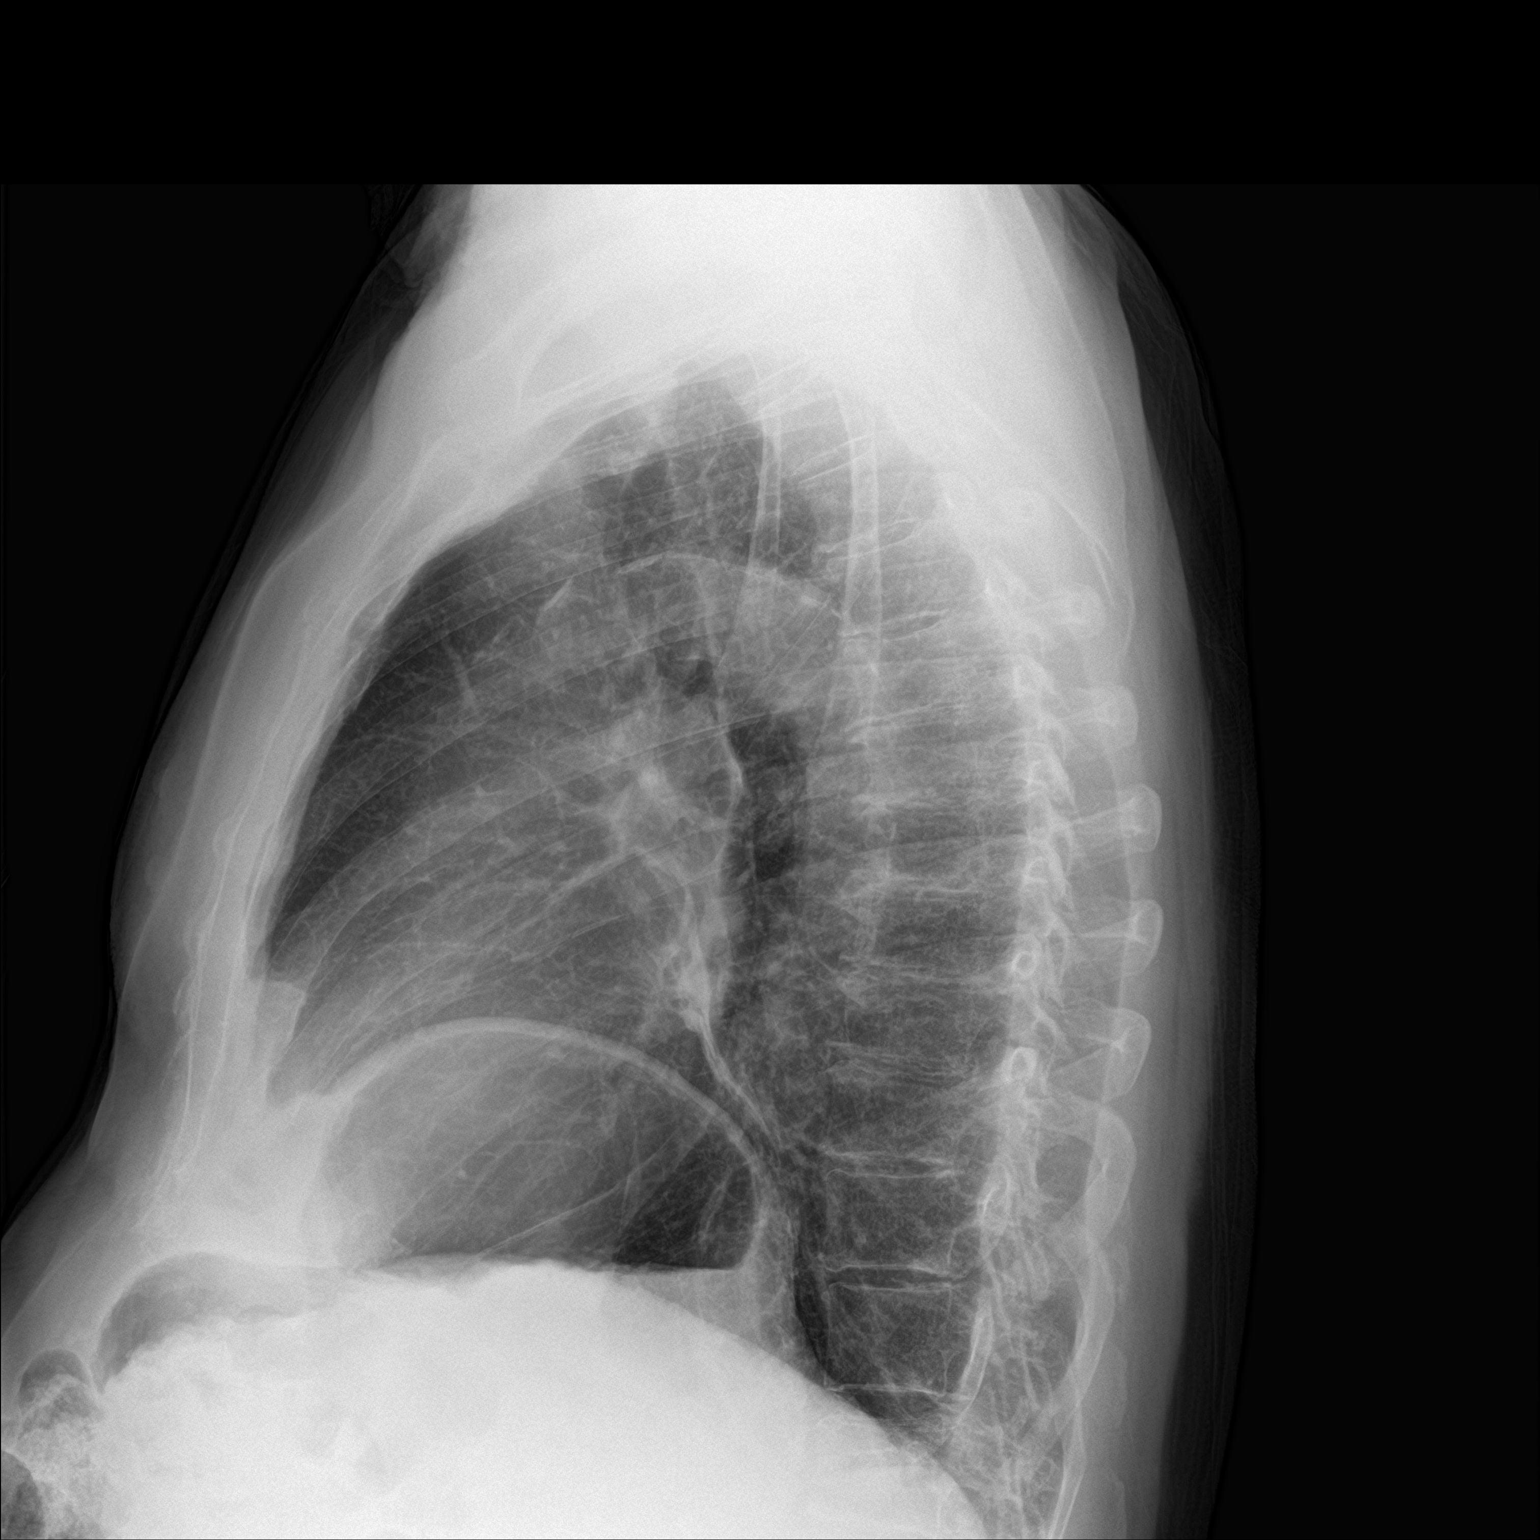

[2 of 2 positions shown; findings below may reference images not displayed]

FINDINGS: Stable normal cardiac silhouette given projection and technique.
Uncoiled aorta. Aortic atherosclerosis with calcification. Reticular
and peripheral linear opacities of the lungs. No consolidation. No
pleural effusion or pneumothorax. No acute osseous abnormality is
evident.
IMPRESSION: Mild interstitial edema. No consolidation. Aortic Atherosclerosis
([id number]).

## 2019-11-02 ENCOUNTER — Other Ambulatory Visit: Payer: Self-pay

## 2019-11-02 ENCOUNTER — Ambulatory Visit (INDEPENDENT_AMBULATORY_CARE_PROVIDER_SITE_OTHER): Payer: Medicare PPO

## 2019-11-02 ENCOUNTER — Ambulatory Visit (INDEPENDENT_AMBULATORY_CARE_PROVIDER_SITE_OTHER): Payer: Medicare PPO | Admitting: Dermatology

## 2019-11-02 DIAGNOSIS — D485 Neoplasm of uncertain behavior of skin: Secondary | ICD-10-CM

## 2019-11-02 DIAGNOSIS — L570 Actinic keratosis: Secondary | ICD-10-CM

## 2019-11-02 DIAGNOSIS — L578 Other skin changes due to chronic exposure to nonionizing radiation: Secondary | ICD-10-CM

## 2019-11-02 DIAGNOSIS — C44519 Basal cell carcinoma of skin of other part of trunk: Secondary | ICD-10-CM

## 2019-11-02 DIAGNOSIS — C4491 Basal cell carcinoma of skin, unspecified: Secondary | ICD-10-CM

## 2019-11-02 HISTORY — DX: Basal cell carcinoma of skin, unspecified: C44.91

## 2019-11-02 MED ORDER — AMINOLEVULINIC ACID HCL 20 % EX SOLR
1.0000 "application " | Freq: Once | CUTANEOUS | Status: AC
  Administered 2019-11-02: 354 mg via TOPICAL

## 2019-11-02 NOTE — Patient Instructions (Signed)

## 2019-11-02 NOTE — Patient Instructions (Addendum)
Recommend daily broad spectrum sunscreen SPF 30+ to sun-exposed areas, reapply every 2 hours as needed. Call for new or changing lesions.  Wound Care Instructions  1. Cleanse wound gently with soap and water once a day then pat dry with clean gauze. Apply a thing coat of Petrolatum (petroleum jelly, "Vaseline") over the wound (unless you have an allergy to this). We recommend that you use a new, sterile tube of Vaseline. Do not pick or remove scabs. Do not remove the yellow or white "healing tissue" from the base of the wound.  2. Cover the wound with fresh, clean, nonstick gauze and secure with paper tape. You may use Band-Aids in place of gauze and tape if the would is small enough, but would recommend trimming much of the tape off as there is often too much. Sometimes Band-Aids can irritate the skin.  3. You should call the office for your biopsy report after 1 week if you have not already been contacted.  4. If you experience any problems, such as abnormal amounts of bleeding, swelling, significant bruising, significant pain, or evidence of infection, please call the office immediately.  5. FOR ADULT SURGERY PATIENTS: If you need something for pain relief you may take 1 extra strength Tylenol (acetaminophen) AND 2 Ibuprofen (200mg  each) together every 4 hours as needed for pain. (do not take these if you are allergic to them or if you have a reason you should not take them.) Typically, you may only need pain medication for 1 to 3 days.    [place]

## 2019-11-02 NOTE — Progress Notes (Signed)
   Follow-Up Visit   Subjective  Timothy Goodwin is a 85 y.o. male who presents for the following: Skin Problem.  Patient here today for a spot on his back. Patient does not know how long it's been there but his wife noticed it and wanted him to get it looked at. No symptoms with it. No history of skin cancers. Patient here today for PDT of the face with [facility].   The following portions of the chart were reviewed this encounter and updated as appropriate:  Tobacco  Allergies  Meds  Problems  Med Hx  Surg Hx  Fam Hx     Review of Systems:  No other skin or systemic complaints except as noted in HPI or Assessment and Plan.  Objective  Well appearing patient in no apparent distress; mood and affect are within normal limits.  A focused examination was performed including back. Relevant physical exam findings are noted in the Assessment and Plan.  Objective  Upper Back Spinal: 1.0cm crusted pink patch   Assessment & Plan  Neoplasm of uncertain behavior of skin Upper Back Spinal  Epidermal / dermal shaving  Lesion diameter (cm):  1 Informed consent: discussed and consent obtained   Timeout: patient name, date of birth, surgical site, and procedure verified   Procedure prep:  Patient was prepped and draped in usual sterile fashion Prep type:  Isopropyl alcohol Anesthesia: the lesion was anesthetized in a standard fashion   Anesthetic:  1% lidocaine w/ epinephrine 1-100,000 buffered w/ 8.4% NaHCO3 Instrument used: flexible razor blade   Hemostasis achieved with: pressure, aluminum chloride and electrodesiccation   Outcome: patient tolerated procedure well   Post-procedure details: sterile dressing applied and wound care instructions given   Dressing type: bandage and petrolatum    Destruction of lesion Complexity: extensive   Destruction method: electrodesiccation and curettage   Informed consent: discussed and consent obtained   Timeout:  patient name, date of birth,  surgical site, and procedure verified Procedure prep:  Patient was prepped and draped in usual sterile fashion Prep type:  Isopropyl alcohol Anesthesia: the lesion was anesthetized in a standard fashion   Anesthetic:  1% lidocaine w/ epinephrine 1-100,000 buffered w/ 8.4% NaHCO3 Curettage performed in three different directions: Yes   Electrodesiccation performed over the curetted area: Yes   Lesion length (cm):  1 Lesion width (cm):  1 Margin per side (cm):  0.2 Final wound size (cm):  1.4 Hemostasis achieved with:  pressure, aluminum chloride and electrodesiccation Outcome: patient tolerated procedure well with no complications   Post-procedure details: sterile dressing applied and wound care instructions given   Dressing type: bandage and petrolatum    Specimen 1 - Surgical pathology Differential Diagnosis: r/o BCC Check Margins: No 1.0cm crusted pink patch  Actinic Damage - diffuse scaly erythematous macules with underlying dyspigmentation - Recommend daily broad spectrum sunscreen SPF 30+ to sun-exposed areas, reapply every 2 hours as needed.  - Call for new or changing lesions.   Return as scheduled.  Graciella Belton, RMA, am acting as scribe for Sarina Ser, MD . Documentation: I have reviewed the above documentation for accuracy and completeness, and I agree with the above.  Sarina Ser, MD

## 2019-11-02 NOTE — Progress Notes (Signed)

## 2019-11-06 ENCOUNTER — Telehealth: Payer: Self-pay

## 2019-11-06 ENCOUNTER — Encounter: Payer: Self-pay | Admitting: Dermatology

## 2019-11-06 NOTE — Telephone Encounter (Signed)
Left message with male to have patient return my call

## 2019-11-06 NOTE — Telephone Encounter (Signed)
-----   Message from Ralene Bathe, MD sent at 11/05/2019  2:50 PM EDT ----- Skin , upper back spinal BASAL CELL CARCINOMA, NODULAR PATTERN  Cancer - BCC Already treated Recheck next visit

## 2019-11-06 NOTE — Telephone Encounter (Signed)
Patient called and informed of biopsy results, patient verbalized understanding.  

## 2020-03-08 ENCOUNTER — Encounter: Payer: Self-pay | Admitting: Emergency Medicine

## 2020-03-08 ENCOUNTER — Emergency Department: Payer: Medicare PPO

## 2020-03-08 ENCOUNTER — Emergency Department
Admission: EM | Admit: 2020-03-08 | Discharge: 2020-03-08 | Disposition: A | Discharge status: Home / Self Care | Payer: Medicare PPO | Attending: Emergency Medicine | Admitting: Emergency Medicine

## 2020-03-08 ENCOUNTER — Other Ambulatory Visit: Payer: Self-pay

## 2020-03-08 DIAGNOSIS — Z79899 Other long term (current) drug therapy: Secondary | ICD-10-CM | POA: Diagnosis not present

## 2020-03-08 DIAGNOSIS — I4891 Unspecified atrial fibrillation: Secondary | ICD-10-CM | POA: Diagnosis not present

## 2020-03-08 DIAGNOSIS — Z8546 Personal history of malignant neoplasm of prostate: Secondary | ICD-10-CM | POA: Diagnosis not present

## 2020-03-08 DIAGNOSIS — I2510 Atherosclerotic heart disease of native coronary artery without angina pectoris: Secondary | ICD-10-CM | POA: Diagnosis not present

## 2020-03-08 DIAGNOSIS — N183 Chronic kidney disease, stage 3 unspecified: Secondary | ICD-10-CM | POA: Diagnosis not present

## 2020-03-08 DIAGNOSIS — K4090 Unilateral inguinal hernia, without obstruction or gangrene, not specified as recurrent: Secondary | ICD-10-CM | POA: Diagnosis not present

## 2020-03-08 DIAGNOSIS — I129 Hypertensive chronic kidney disease with stage 1 through stage 4 chronic kidney disease, or unspecified chronic kidney disease: Secondary | ICD-10-CM | POA: Diagnosis not present

## 2020-03-08 DIAGNOSIS — Z87442 Personal history of urinary calculi: Secondary | ICD-10-CM | POA: Diagnosis not present

## 2020-03-08 DIAGNOSIS — N200 Calculus of kidney: Secondary | ICD-10-CM

## 2020-03-08 DIAGNOSIS — Z951 Presence of aortocoronary bypass graft: Secondary | ICD-10-CM | POA: Insufficient documentation

## 2020-03-08 DIAGNOSIS — N132 Hydronephrosis with renal and ureteral calculous obstruction: Secondary | ICD-10-CM | POA: Insufficient documentation

## 2020-03-08 DIAGNOSIS — R1032 Left lower quadrant pain: Secondary | ICD-10-CM | POA: Diagnosis present

## 2020-03-08 LAB — URINALYSIS, COMPLETE (UACMP) WITH MICROSCOPIC
Bacteria, UA: NONE SEEN
Bilirubin Urine: NEGATIVE
Glucose, UA: NEGATIVE mg/dL
Ketones, ur: NEGATIVE mg/dL
Leukocytes,Ua: NEGATIVE
Nitrite: NEGATIVE
Protein, ur: 30 mg/dL — AB
Specific Gravity, Urine: 1.009 (ref 1.005–1.030)
Squamous Epithelial / HPF: NONE SEEN (ref 0–5)
pH: 7 (ref 5.0–8.0)

## 2020-03-08 LAB — CBC WITH DIFFERENTIAL/PLATELET
Abs Immature Granulocytes: 0.03 10*3/uL (ref 0.00–0.07)
Basophils Absolute: 0 10*3/uL (ref 0.0–0.1)
Basophils Relative: 0 %
Eosinophils Absolute: 0.2 10*3/uL (ref 0.0–0.5)
Eosinophils Relative: 2 %
HCT: 47.7 % (ref 39.0–52.0)
Hemoglobin: 16 g/dL (ref 13.0–17.0)
Immature Granulocytes: 0 %
Lymphocytes Relative: 11 %
Lymphs Abs: 0.8 10*3/uL (ref 0.7–4.0)
MCH: 30.7 pg (ref 26.0–34.0)
MCHC: 33.5 g/dL (ref 30.0–36.0)
MCV: 91.6 fL (ref 80.0–100.0)
Monocytes Absolute: 0.4 10*3/uL (ref 0.1–1.0)
Monocytes Relative: 5 %
Neutro Abs: 6.2 10*3/uL (ref 1.7–7.7)
Neutrophils Relative %: 82 %
Platelets: 166 10*3/uL (ref 150–400)
RBC: 5.21 MIL/uL (ref 4.22–5.81)
RDW: 13 % (ref 11.5–15.5)
WBC: 7.6 10*3/uL (ref 4.0–10.5)
nRBC: 0 % (ref 0.0–0.2)

## 2020-03-08 LAB — COMPREHENSIVE METABOLIC PANEL
ALT: 18 U/L (ref 0–44)
AST: 30 U/L (ref 15–41)
Albumin: 4.4 g/dL (ref 3.5–5.0)
Alkaline Phosphatase: 74 U/L (ref 38–126)
Anion gap: 12 (ref 5–15)
BUN: 32 mg/dL — ABNORMAL HIGH (ref 8–23)
CO2: 22 mmol/L (ref 22–32)
Calcium: 9.6 mg/dL (ref 8.9–10.3)
Chloride: 107 mmol/L (ref 98–111)
Creatinine, Ser: 2.08 mg/dL — ABNORMAL HIGH (ref 0.61–1.24)
GFR, Estimated: 31 mL/min — ABNORMAL LOW (ref >60)
Glucose, Bld: 152 mg/dL — ABNORMAL HIGH (ref 70–99)
Potassium: 4.7 mmol/L (ref 3.5–5.1)
Sodium: 141 mmol/L (ref 135–145)
Total Bilirubin: 0.9 mg/dL (ref 0.3–1.2)
Total Protein: 7.8 g/dL (ref 6.5–8.1)

## 2020-03-08 MED ORDER — FENTANYL CITRATE (PF) 100 MCG/2ML IJ SOLN
50.0000 ug | Freq: Once | INTRAMUSCULAR | Status: AC
  Administered 2020-03-08: 50 ug via INTRAVENOUS
  Filled 2020-03-08: qty 2

## 2020-03-08 MED ORDER — TRAMADOL HCL 50 MG PO TABS
50.0000 mg | ORAL_TABLET | Freq: Four times a day (QID) | ORAL | 0 refills | Status: DC | PRN
Start: 2020-03-08 — End: 2020-10-16

## 2020-03-08 NOTE — ED Provider Notes (Signed)
[facility] Emergency Department Provider Note  Time seen: 8:09 AM  I have reviewed the triage vital signs and the nursing notes.   HISTORY  Chief Complaint Groin Pain   HPI Timothy Goodwin is a 85 y.o. male with a past medical history of hypertension, hyperlipidemia, CKD, past kidney stone, presents to the emergency department for left groin pain.  According to the patient he awoke this morning with moderate pain to his left lower quadrant/left groin.  Patient states he had a kidney stone 8 years ago that presented similarly.  Patient denies any dysuria or hematuria.  No nausea or vomiting.  No fever.  Largely negative review of systems otherwise.   Past Medical History:  Diagnosis Date  . Actinic keratosis   . Hypertension     Patient Active Problem List   Diagnosis Date Noted  . Hyperlipidemia 05/16/2018  . Atrial fibrillation ([place]) 04/09/2018  . ATN (acute tubular necrosis) ([place]) 04/07/2018  . 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 3-vessel coronary artery disease 03/31/2018  . NSTEMI (non-ST elevated myocardial infarction) ([place]) 03/29/2018  . Essential hypertension 06/05/2016  . Renal artery stenosis ([place]) 06/05/2016  . CKD (chronic kidney disease) stage 3, GFR 30-59 ml/min ([facility]) 11/12/2014  . Cancer of prostate ([place]) 05/14/2014    Past Surgical History:  Procedure Laterality Date  . heart stent placement    . LEFT HEART CATH AND CORONARY ANGIOGRAPHY N/A 03/30/2018   Procedure: LEFT HEART CATH AND CORONARY ANGIOGRAPHY and possible PCI and stent;  Surgeon: Yolonda Kida, MD;  Location: [facility];  Service: Cardiovascular;  Laterality: N/A;  . PROSTATE SURGERY      Prior to Admission medications   Medication Sig Start Date End Date Taking? Authorizing Provider  allopurinol (ZYLOPRIM) 100 MG tablet TAKE 1 TABLET DAILY 03/27/16   [provider]  amiodarone (PACERONE) 200 MG tablet  05/07/18   [provider]  amLODipine  (NORVASC) 10 MG tablet Take 10 mg by mouth daily.  02/26/16   [provider]  atorvastatin (LIPITOR) 40 MG tablet  04/19/18   [provider]  calcium carbonate 1250 MG capsule Take 1,250 mg by mouth daily.     [provider]  cloNIDine (CATAPRES) 0.1 MG tablet Take 0.1 mg by mouth 2 (two) times daily.  03/16/16 03/29/18  [provider]  clopidogrel (PLAVIX) 75 MG tablet Take by mouth. 04/25/18   [provider]  co-enzyme Q-10 30 MG capsule Take 30 mg by mouth 3 (three) times daily.    [provider]  furosemide (LASIX) 20 MG tablet  05/04/18   [provider]  heparin 25000-0.45 UT/250ML-% infusion Inject 1,000 Units/hr into the vein continuous. Patient not taking: Reported on 05/16/2018 03/31/18   Fritzi Mandes, MD  isosorbide mononitrate (IMDUR) 60 MG 24 hr tablet Take 1 tablet (60 mg total) by mouth daily. Patient not taking: Reported on 05/16/2018 03/31/18   Epifanio Lesches, MD  magnesium 30 MG tablet Take 30 mg by mouth 2 (two) times daily.    [provider]  metoprolol tartrate (LOPRESSOR) 25 MG tablet Take 25 mg by mouth 2 (two) times daily.  06/01/16   [provider]  Multiple Vitamin (MULTIVITAMIN) capsule Take 1 capsule by mouth daily.    [provider]  pantoprazole (PROTONIX) 40 MG tablet  04/19/18   [provider]  potassium chloride (MICRO-K) 10 MEQ CR capsule  05/04/18   [provider]  TURMERIC PO  Take by mouth.    [provider]  vitamin C (ASCORBIC ACID) 500 MG tablet Take by mouth.    [provider]  Vitamin D, Ergocalciferol, (DRISDOL) 50000 units CAPS capsule  04/22/16   [provider]  vitamin E 200 UNIT capsule Take by mouth.    [provider]    Allergies  Allergen Reactions  . Aspirin Shortness Of Breath  . Lisinopril Shortness Of Breath  . Nsaids Swelling  . Sulfa Antibiotics Swelling and Rash    No family  history on file.  Social History Social History   Tobacco Use  . Smoking status: Never Smoker  . Smokeless tobacco: Never Used  Substance Use Topics  . Alcohol use: Yes    Comment: occasionally  . Drug use: No    Review of Systems Constitutional: Negative for fever. Cardiovascular: Negative for chest pain. Respiratory: Negative for shortness of breath. Gastrointestinal: Left lower quadrant/left groin pain.  Negative for vomiting or diarrhea Genitourinary: Negative for urinary compaints Musculoskeletal: Negative for musculoskeletal complaints Neurological: Negative for headache All other ROS negative  ____________________________________________   PHYSICAL EXAM:  VITAL SIGNS: ED Triage Vitals  Enc Vitals Group     BP 03/08/20 0714 (!) 194/82     Pulse Rate 03/08/20 0714 87     Resp 03/08/20 0714 18     Temp 03/08/20 0714 98.7 F (37.1 C)     Temp Source 03/08/20 0714 Oral     SpO2 03/08/20 0714 96 %     Weight 03/08/20 0651 165 lb (74.8 kg)     Height 03/08/20 0651 5\' 5"  (1.651 m)     Head Circumference --      Peak Flow --      Pain Score 03/08/20 0651 7     Pain Loc --      Pain Edu? --      Excl. in [place]? --     Constitutional: Alert and oriented. Well appearing and in no distress. Eyes: Normal exam ENT      Head: Normocephalic and atraumatic.      Mouth/Throat: Mucous membranes are moist. Cardiovascular: Normal rate, regular rhythm.  Respiratory: Normal respiratory effort without tachypnea nor retractions. Breath sounds are clear  Gastrointestinal: Soft, slight tenderness over the inguinal canal with likely hernia.  No abdominal tenderness. Musculoskeletal: Nontender with normal range of motion in all extremities.  Neurologic:  Normal speech and language. No gross focal neurologic deficits Skin:  Skin is warm, dry and intact.  Psychiatric: Mood and affect are normal.   ____________________________________________   RADIOLOGY  CT shows 5 mm stone  within the bladder with mild left hydroureteronephrosis.  However CT also shows a large left inguinal hernia with mild circumferential wall thickening of the colon in the hernia sac raising the possibility of strangulation.  ____________________________________________   INITIAL IMPRESSION / ASSESSMENT AND PLAN / ED COURSE  Pertinent labs & imaging results that were available during my care of the patient were reviewed by me and considered in my medical decision making (see chart for details).   Patient presents to the emergency department for left groin pain since this morning.  Overall the patient appears well.  Urinalysis does show 6-10 RBCs given his history of the past kidney stone we will obtain a CT renal scan to evaluate for possible ureterolithiasis as the patient states this feels identical to the past kidney stone he had a years ago.  Differential would also include urinary tract infection, diverticulitis  or colitis.  Lab work is pending, CT scan pending.  Patient states very minimal discomfort currently.  Patient CT scan has resulted showing a kidney stone within the bladder and also mild left hydroureteronephrosis.  This would indicate a likely recently passed stone possibly from the left side which would account for the patient's symptoms this morning.  However the CT also shows a large left inguinal hernia with signs of possible strangulation.  I have reevaluated the patient's GU exam, he has a large left scrotal hernia that is unable to be reduced.  Patient states that is been present for years there is mild tenderness when attempting to reduce it.  No erythema or skin color changes overlying the hernia.  We will discuss with surgery.  Dr. Christian Mate has been down to see the patient.  Was able to reduce the patient's hernia.  Agrees that the pain is likely from the recently passed kidney stone.  We will discharge patient home with a short course of pain medication and follow-up with Dr.  Christian Mate in surgery.  Patient agreeable to plan of care.  Discussed return precautions.  Timothy Goodwin was evaluated in Emergency Department on 03/08/2020 for the symptoms described in the history of present illness. He was evaluated in the context of the global COVID-19 pandemic, which necessitated consideration that the patient might be at risk for infection with the SARS-CoV-2 virus that causes COVID-19. Institutional protocols and algorithms that pertain to the evaluation of patients at risk for COVID-19 are in a state of rapid change based on information released by regulatory bodies including the CDC and federal and state organizations. These policies and algorithms were followed during the patient's care in the ED.  ____________________________________________   FINAL CLINICAL IMPRESSION(S) / ED DIAGNOSES  Left groin pain Kidney stone   Harvest Dark, MD 03/08/20 1108

## 2020-03-08 NOTE — ED Triage Notes (Signed)
Patient ambulatory to triage with steady gait, without difficulty or distress noted; pt reports left groin pain since last night; denies any accomp symptoms; denies any abd or back pain; denies any swelling to area; st had kidney stone many yrs ago and presented same

## 2020-03-08 NOTE — Consult Note (Signed)
[facility] SURGICAL CONSULTATION NOTE (initial) - cpt: 72094 (Outpatient/ED)   HISTORY OF PRESENT ILLNESS (HPI):  85 y.o. male presented to [facility] ED today for evaluation of left lower quadrant and suprapubic region pain. Patient reports recently passing a kidney stone that was noted in his bladder. He was known to have a left groin hernia, which was apparently more prominent and painful as well, pre-CT efforts to reduce reportedly unsuccessful.  CT scan showed large intestine within the left scrotal sac.  Concerns at this may be incarcerated/strangulated arise.  Surgery is consulted by ER physician Dr. Kerman Passey in this context for evaluation and management possible incarcerated left inguinal hernia.  PAST MEDICAL HISTORY (PMH):  Past Medical History:  Diagnosis Date  . Actinic keratosis   . Hypertension      PAST SURGICAL HISTORY ([place]):  Past Surgical History:  Procedure Laterality Date  . heart stent placement    . LEFT HEART CATH AND CORONARY ANGIOGRAPHY N/A 03/30/2018   Procedure: LEFT HEART CATH AND CORONARY ANGIOGRAPHY and possible PCI and stent;  Surgeon: Yolonda Kida, MD;  Location: [facility];  Service: Cardiovascular;  Laterality: N/A;  . PROSTATE SURGERY       MEDICATIONS:  Prior to Admission medications   Medication Sig Start Date End Date Taking? Authorizing Provider  allopurinol (ZYLOPRIM) 100 MG tablet TAKE 1 TABLET DAILY 03/27/16   [provider]  amiodarone (PACERONE) 200 MG tablet  05/07/18   [provider]  amLODipine (NORVASC) 10 MG tablet Take 10 mg by mouth daily.  02/26/16   [provider]  atorvastatin (LIPITOR) 40 MG tablet  04/19/18   [provider]  calcium carbonate 1250 MG capsule Take 1,250 mg by mouth daily.     [provider]  cloNIDine (CATAPRES) 0.1 MG tablet Take 0.1 mg by mouth 2 (two) times daily.  03/16/16 03/29/18  [provider]  clopidogrel (PLAVIX)  75 MG tablet Take by mouth. 04/25/18   [provider]  co-enzyme Q-10 30 MG capsule Take 30 mg by mouth 3 (three) times daily.    [provider]  furosemide (LASIX) 20 MG tablet  05/04/18   [provider]  heparin 25000-0.45 UT/250ML-% infusion Inject 1,000 Units/hr into the vein continuous. Patient not taking: Reported on 05/16/2018 03/31/18   Fritzi Mandes, MD  isosorbide mononitrate (IMDUR) 60 MG 24 hr tablet Take 1 tablet (60 mg total) by mouth daily. Patient not taking: Reported on 05/16/2018 03/31/18   Epifanio Lesches, MD  magnesium 30 MG tablet Take 30 mg by mouth 2 (two) times daily.    [provider]  metoprolol tartrate (LOPRESSOR) 25 MG tablet Take 25 mg by mouth 2 (two) times daily.  06/01/16   [provider]  Multiple Vitamin (MULTIVITAMIN) capsule Take 1 capsule by mouth daily.    [provider]  pantoprazole (PROTONIX) 40 MG tablet  04/19/18   [provider]  potassium chloride (MICRO-K) 10 MEQ CR capsule  05/04/18   [provider]  TURMERIC PO Take by mouth.    [provider]  vitamin C (ASCORBIC ACID) 500 MG tablet Take by mouth.    [provider]  Vitamin D, Ergocalciferol, (DRISDOL) 50000 units CAPS capsule  04/22/16   [provider]  vitamin E 200 UNIT capsule Take by mouth.    [provider]     ALLERGIES:  Allergies  Allergen Reactions  . Aspirin Shortness Of Breath  . Lisinopril  Shortness Of Breath  . Nsaids Swelling  . Sulfa Antibiotics Swelling and Rash     SOCIAL HISTORY:  Social History   Socioeconomic History  . Marital status: Married    Spouse name: Not on file  . Number of children: Not on file  . Years of education: Not on file  . Highest education level: Not on file  Occupational History  . Not on file  Tobacco Use  . Smoking status: Never Smoker  . Smokeless tobacco: Never Used  Substance and Sexual Activity  . Alcohol use: Yes     Comment: occasionally  . Drug use: No  . Sexual activity: Not on file  Other Topics Concern  . Not on file  Social History Narrative  . Not on file   Social Determinants of Health   Financial Resource Strain:   . Difficulty of Paying Living Expenses: Not on file  Food Insecurity:   . Worried About Charity fundraiser in the Last Year: Not on file  . Ran Out of Food in the Last Year: Not on file  Transportation Needs:   . Lack of Transportation (Medical): Not on file  . Lack of Transportation (Non-Medical): Not on file  Physical Activity:   . Days of Exercise per Week: Not on file  . Minutes of Exercise per Session: Not on file  Stress:   . Feeling of Stress : Not on file  Social Connections:   . Frequency of Communication with Friends and Family: Not on file  . Frequency of Social Gatherings with Friends and Family: Not on file  . Attends Religious Services: Not on file  . Active Member of Clubs or Organizations: Not on file  . Attends Archivist Meetings: Not on file  . Marital Status: Not on file  Intimate Partner Violence:   . Fear of Current or Ex-Partner: Not on file  . Emotionally Abused: Not on file  . Physically Abused: Not on file  . Sexually Abused: Not on file     FAMILY HISTORY:  No family history on file.    REVIEW OF SYSTEMS:  ROS  Constitutional: Negative for fever. Cardiovascular: Negative for chest pain. Respiratory: Negative for shortness of breath. Gastrointestinal: Left lower quadrant/left groin pain.  Negative for vomiting or diarrhea Genitourinary: Negative for urinary compaints Musculoskeletal: Negative for musculoskeletal complaints Neurological: Negative for headache All other ROS negative  VITAL SIGNS:  Temp:  [98.7 F (37.1 C)] 98.7 F (37.1 C) (11/19 0714) Pulse Rate:  [87-101] 96 (11/19 1030) Resp:  [18] 18 (11/19 1030) BP: (184-194)/(82-101) 184/101 (11/19 1030) SpO2:  [96 %-100 %] 97 % (11/19 1030) Weight:  [74.8  kg] 74.8 kg (11/19 0651)     Height: 5\' 5"  (165.1 cm) Weight: 74.8 kg BMI (Calculated): 27.46   INTAKE/OUTPUT:  No intake/output data recorded.  PHYSICAL EXAM:  Physical Exam Blood pressure (!) 184/101, pulse 96, temperature 98.7 F (37.1 C), temperature source Oral, resp. rate 18, height 5\' 5"  (1.651 m), weight 74.8 kg, SpO2 97 %. Last Weight  Most recent update: 03/08/2020  6:51 AM   Weight  74.8 kg (165 lb)            CONSTITUTIONAL: Well developed, and nourished, appropriately responsive and aware without distress.   EYES: Sclera non-icteric.   EARS, NOSE, MOUTH AND THROAT: Mask worn.  Hearing is intact to voice.  NECK: Trachea is midline, and there is no jugular venous distension.  LYMPH NODES:  Lymph nodes in  the neck are not enlarged. RESPIRATORY:  Lungs are clear, and breath sounds are equal bilaterally. Normal respiratory effort without pathologic use of accessory muscles. CARDIOVASCULAR: Heart is regular in rate and rhythm. GI: The abdomen is  soft, nontender, and nondistended. There were no palpable masses. I did not appreciate hepatosplenomegaly. There were normal bowel sounds. GU: Left inguinal hernia, readily reducible. MUSCULOSKELETAL:  Symmetrical muscle tone appreciated in all four extremities.    SKIN: Skin turgor is normal. No pathologic skin lesions appreciated.  NEUROLOGIC:  Motor and sensation appear grossly normal.  Cranial nerves are grossly without defect. PSYCH:  Alert and oriented to person, place and time. Affect is appropriate for situation.  Data Reviewed I have personally reviewed what is currently available of the patient's imaging, recent labs and medical records.    Labs:  CBC Latest Ref Rng & Units 03/08/2020 03/30/2018 03/29/2018  WBC 4.0 - 10.5 K/uL 7.6 5.9 6.1  Hemoglobin 13.0 - 17.0 g/dL 16.0 13.6 13.4  Hematocrit 39 - 52 % 47.7 41.6 41.6  Platelets 150 - 400 K/uL 166 222 222   CMP Latest Ref Rng & Units 03/08/2020 03/29/2018 03/28/2018   Glucose 70 - 99 mg/dL 152(H) 102(H) 156(H)  BUN 8 - 23 mg/dL 32(H) 26(H) 27(H)  Creatinine 0.61 - 1.24 mg/dL 2.08(H) 1.36(H) 1.75(H)  Sodium 135 - 145 mmol/L 141 141 141  Potassium 3.5 - 5.1 mmol/L 4.7 4.1 4.2  Chloride 98 - 111 mmol/L 107 111 108  CO2 22 - 32 mmol/L 22 23 25   Calcium 8.9 - 10.3 mg/dL 9.6 8.9 9.1  Total Protein 6.5 - 8.1 g/dL 7.8 - -  Total Bilirubin 0.3 - 1.2 mg/dL 0.9 - -  Alkaline Phos 38 - 126 U/L 74 - -  AST 15 - 41 U/L 30 - -  ALT 0 - 44 U/L 18 - -     Imaging studies:   Last 24 hrs: CT Renal Stone Study  Result Date: 03/08/2020 CLINICAL DATA:  Left groin pain EXAM: CT ABDOMEN AND PELVIS WITHOUT CONTRAST TECHNIQUE: Multidetector CT imaging of the abdomen and pelvis was performed following the standard protocol without IV contrast. COMPARISON:  None. FINDINGS: Lower chest: Bandlike atelectasis within the left lung base. Tiny calcified granuloma in the right lung base. 2 mm ground-glass nodule within the periphery of the right middle lobe (series 4, image 7). Coronary artery calcification is present. Hepatobiliary: The unenhanced liver is within normal limits. Multiple small layering gallstones within the gallbladder lumen. No pericholecystic inflammatory changes by CT. Pancreas: Unremarkable. No pancreatic ductal dilatation or surrounding inflammatory changes. Spleen: Normal in size without focal abnormality. Adrenals/Urinary Tract: Unremarkable adrenal glands. Nonspecific perinephric stranding both kidneys. Mild left hydroureteronephrosis. There is a round 5 mm stone within the bladder lumen suggestive of a recently passed left renal stone. No additional calculi within the left kidney or ureter. No right-sided renal or ureteral stone. Stomach/Bowel: Probable small sliding hiatal hernia. Stomach is otherwise unremarkable. No dilated loops of small bowel. A normal appendix is present in the right lower quadrant (series 2, image 64). A loop of colon near the  descending-sigmoid junction extends into large left inguinal hernia sac. There is mild circumferential wall thickening involving the colon within the hernia sac (series 2, image 97) without appreciable fat stranding or adjacent fluid. Additionally, a small portion of mid transverse colon protrudes into a small umbilical hernia (series 2, image 51) without wall thickening or fluid. Large air-containing diverticulum emanates from the rectosigmoid colon measuring approximately  2.0 cm. Vascular/Lymphatic: Aortoiliac atherosclerosis without aneurysm. No abdominopelvic lymphadenopathy. Reproductive: Prostate gland appears surgically absent. Other: No ascites. No abdominopelvic fluid collection. No pneumoperitoneum. Musculoskeletal: Mild lumbar dextrocurvature with advanced multilevel lumbar spondylosis. No acute osseous abnormality. IMPRESSION: 1. Large left inguinal hernia containing a loop of colon near the descending-sigmoid junction. There is mild circumferential wall thickening involving the colon within the hernia sac raising possibility for bowel strangulation. Surgical evaluation is recommended. 2. Mild left hydroureteronephrosis. There is a 5 mm stone within the bladder lumen suggestive of a recently passed left renal stone. 3. Small portion of mid transverse colon protrudes into a small umbilical hernia without wall thickening or fluid. 4. Cholelithiasis without evidence of acute cholecystitis. 5. 2 mm ground-glass nodule within the periphery of the right middle lobe, which may reflect a small infectious or inflammatory nodule. No follow-up recommended. This recommendation follows the consensus statement: Guidelines for Management of Incidental Pulmonary Nodules Detected on CT Images: From the Fleischner Society 2017; Radiology 2017; 284:228-243. Aortic Atherosclerosis (ICD10-I70.0). Electronically Signed   By: Davina Poke D.O.   On: 03/08/2020 08:54     Assessment/Plan:  85 y.o. male with left  inguinal hernia, thought to be incarcerated/strangulated, completely reduced.  Complicated by pertinent comorbidities including:  Patient Active Problem List   Diagnosis Date Noted  . Hyperlipidemia 05/16/2018  . Atrial fibrillation ([place]) 04/09/2018  . ATN (acute tubular necrosis) ([place]) 04/07/2018  . 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 3-vessel coronary artery disease 03/31/2018  . NSTEMI (non-ST elevated myocardial infarction) ([place]) 03/29/2018  . Essential hypertension 06/05/2016  . Renal artery stenosis ([place]) 06/05/2016  . CKD (chronic kidney disease) stage 3, GFR 30-59 ml/min (H[facility]) 11/12/2014  . Cancer of prostate ([place]) 05/14/2014    -Will defer for elective left inguinal hernia repair, we will gladly see in the office in the near future for outpatient follow-up.  -Kidney stone/bladder stones per ED physician/urology.   All of the above findings and recommendations were discussed with the patient, and all of patient's questions were answered to his expressed satisfaction.  Thank you for the opportunity to participate in this patient's care.   -- Ronny Bacon, M.D., FACS 03/08/2020, 10:53 AM

## 2020-03-08 NOTE — Discharge Instructions (Signed)
Please call the number provided for surgery to arrange follow-up.  Return to the emergency department for any return of/worsening pain, vomiting, unable to have a bowel movement, or any other symptom personally concerning to yourself.

## 2020-03-27 ENCOUNTER — Other Ambulatory Visit: Payer: Self-pay

## 2020-03-27 ENCOUNTER — Ambulatory Visit (INDEPENDENT_AMBULATORY_CARE_PROVIDER_SITE_OTHER): Payer: Medicare PPO | Admitting: Dermatology

## 2020-03-27 ENCOUNTER — Encounter: Payer: Self-pay | Admitting: Dermatology

## 2020-03-27 DIAGNOSIS — L570 Actinic keratosis: Secondary | ICD-10-CM | POA: Diagnosis not present

## 2020-03-27 DIAGNOSIS — Z1283 Encounter for screening for malignant neoplasm of skin: Secondary | ICD-10-CM | POA: Diagnosis not present

## 2020-03-27 DIAGNOSIS — Z85828 Personal history of other malignant neoplasm of skin: Secondary | ICD-10-CM

## 2020-03-27 DIAGNOSIS — B078 Other viral warts: Secondary | ICD-10-CM

## 2020-03-27 DIAGNOSIS — L821 Other seborrheic keratosis: Secondary | ICD-10-CM

## 2020-03-27 DIAGNOSIS — D229 Melanocytic nevi, unspecified: Secondary | ICD-10-CM

## 2020-03-27 DIAGNOSIS — D1800 Hemangioma unspecified site: Secondary | ICD-10-CM

## 2020-03-27 DIAGNOSIS — L814 Other melanin hyperpigmentation: Secondary | ICD-10-CM | POA: Diagnosis not present

## 2020-03-27 DIAGNOSIS — L578 Other skin changes due to chronic exposure to nonionizing radiation: Secondary | ICD-10-CM

## 2020-03-27 NOTE — Patient Instructions (Signed)
Cryotherapy Aftercare  . Wash gently with soap and water everyday.   . Apply Vaseline and Band-Aid daily until healed.  

## 2020-03-27 NOTE — Progress Notes (Signed)
Follow-Up Visit   Subjective  Timothy Goodwin is a 85 y.o. male who presents for the following: Annual Exam (Mole check waist up today, hx of skin cancers). The patient presents for Upper Body Skin Exam (UBSE) for skin cancer screening and mole check.  The following portions of the chart were reviewed this encounter and updated as appropriate:   Tobacco  Allergies  Meds  Problems  Med Hx  Surg Hx  Fam Hx     Review of Systems:  No other skin or systemic complaints except as noted in HPI or Assessment and Plan.  Objective  Well appearing patient in no apparent distress; mood and affect are within normal limits.  All skin waist up examined.  Objective  Right Upper back spinal: Well healed scar with no evidence of recurrence.   Objective  hands x 15, face, scalp x 10 (25): Erythematous thin papules/macules with gritty scale.   Objective  Left index finger x 1: Verrucous papules -- Discussed viral etiology and contagion.    Assessment & Plan  History of basal cell carcinoma (BCC) Right Upper back spinal  Clear. Observe for recurrence. Call clinic for new or changing lesions.  Recommend regular skin exams, daily broad-spectrum spf 30+ sunscreen use, and photoprotection.     AK (actinic keratosis) (25) hands x 15, face, scalp x 10  Destruction of lesion - hands x 15, face, scalp x 10 Complexity: simple   Destruction method: cryotherapy   Informed consent: discussed and consent obtained   Timeout:  patient name, date of birth, surgical site, and procedure verified Lesion destroyed using liquid nitrogen: Yes   Region frozen until ice ball extended beyond lesion: Yes   Outcome: patient tolerated procedure well with no complications   Post-procedure details: wound care instructions given    Other viral warts Left index finger x 1  Destruction of lesion - Left index finger x 1 Complexity: simple   Destruction method: cryotherapy   Informed consent: discussed and  consent obtained   Timeout:  patient name, date of birth, surgical site, and procedure verified Lesion destroyed using liquid nitrogen: Yes   Region frozen until ice ball extended beyond lesion: Yes   Outcome: patient tolerated procedure well with no complications   Post-procedure details: wound care instructions given    Skin cancer screening  Lentigines - Scattered tan macules - Discussed due to sun exposure - Benign, observe - Call for any changes  Seborrheic Keratoses - Stuck-on, waxy, tan-brown papules and plaques  - Discussed benign etiology and prognosis. - Observe - Call for any changes  Melanocytic Nevi - Tan-brown and/or pink-flesh-colored symmetric macules and papules - Benign appearing on exam today - Observation - Call clinic for new or changing moles - Recommend daily use of broad spectrum spf 30+ sunscreen to sun-exposed areas.   Hemangiomas - Red papules - Discussed benign nature - Observe - Call for any changes  Actinic Damage - Chronic, secondary to cumulative UV/sun exposure - diffuse scaly erythematous macules with underlying dyspigmentation - Recommend daily broad spectrum sunscreen SPF 30+ to sun-exposed areas, reapply every 2 hours as needed.  - Call for new or changing lesions.  Severe, Confluent Chronic Actinic Changes with Pre-Cancerous Actinic Keratoses due to cumulative sun exposure/UV radiation exposure over time - Discussed "Field Treatment" Recommend PDT on scalp in [employer] treatment involves treatment of an entire area of skin that has confluent Actinic Changes (Sun/ Ultraviolet light damage) and PreCancerous Actinic Keratoses by method of  PhotoDynamic Therapy (PDT) and/or prescription Topical Chemotherapy agents such as 5-fluorouracil, 5-fluorouracil/calcipotriene, and/or imiquimod.  The purpose is to decrease the number of clinically evident and subclinical PreCancerous lesions to prevent progression to development of skin  cancer by chemically destroying early precancer changes that may or may not be visible.  It has been shown to reduce the risk of developing skin cancer in the treated area. As a result of treatment, redness, scaling, crusting, and open sores may occur during treatment course. One or more than one of these methods may be used and may have to be used several times to control, suppress and eliminate the PreCancerous changes. Discussed treatment course, expected reaction, and possible side effects. Skin cancer screening performed today.  Return in about 6 months (around 09/25/2020), or AKS, PDT on scalp in Jan/FEB,.  I, Marye Round, CMA, am acting as scribe for Sarina Ser, MD .  Documentation: I have reviewed the above documentation for accuracy and completeness, and I agree with the above.  Sarina Ser, MD

## 2020-03-28 ENCOUNTER — Encounter: Payer: Self-pay | Admitting: Surgery

## 2020-03-28 ENCOUNTER — Ambulatory Visit (INDEPENDENT_AMBULATORY_CARE_PROVIDER_SITE_OTHER): Payer: Medicare PPO | Admitting: Surgery

## 2020-03-28 VITALS — BP 162/66 | HR 86 | Temp 97.5°F | Ht 65.0 in | Wt 171.6 lb

## 2020-03-28 DIAGNOSIS — K429 Umbilical hernia without obstruction or gangrene: Secondary | ICD-10-CM

## 2020-03-28 DIAGNOSIS — K4090 Unilateral inguinal hernia, without obstruction or gangrene, not specified as recurrent: Secondary | ICD-10-CM

## 2020-03-28 NOTE — Patient Instructions (Addendum)
Dr Christian Mate discussed with patient what to watch out for and to do to avoid the emergency department visit. Dr Christian Mate discussed with patient to give our office a call when he is ready to proceed with hernia repair surgery.  Inguinal Hernia, Adult An inguinal hernia is when fat or your intestines push through a weak spot in a muscle where your leg meets your lower belly (groin). This causes a rounded lump (bulge). This kind of hernia could also be:  In your scrotum, if you are male.  In folds of skin around your vagina, if you are male. There are three types of inguinal hernias. These include:  Hernias that can be pushed back into the belly (are reducible). This type rarely causes pain.  Hernias that cannot be pushed back into the belly (are incarcerated).  Hernias that cannot be pushed back into the belly and lose their blood supply (are strangulated). This type needs emergency surgery. If you do not have symptoms, you may not need treatment. If you have symptoms or a large hernia, you may need surgery. Follow these instructions at home: Lifestyle  Do these things if told by your doctor so you do not have trouble pooping (constipation): ? Drink enough fluid to keep your pee (urine) pale yellow. ? Eat foods that have a lot of fiber. These include fresh fruits and vegetables, whole grains, and beans. ? Limit foods that are high in fat and processed sugars. These include foods that are fried or sweet. ? Take medicine for trouble pooping.  Avoid lifting heavy objects.  Avoid standing for long amounts of time.  Do not use any products that contain nicotine or tobacco. These include cigarettes and e-cigarettes. If you need help quitting, ask your doctor.  Stay at a healthy weight. General instructions  You may try to push your hernia in by very gently pressing on it when you are lying down. Do not try to force the bulge back in if it will not push in easily.  Watch your hernia  for any changes in shape, size, or color. Tell your doctor if you see any changes.  Take over-the-counter and prescription medicines only as told by your doctor.  Keep all follow-up visits as told by your doctor. This is important. Contact a doctor if:  You have a fever.  You have new symptoms.  Your symptoms get worse. Get help right away if:  The area where your leg meets your lower belly has: ? Pain that gets worse suddenly. ? A bulge that gets bigger suddenly, and it does not get smaller after that. ? A bulge that turns red or purple. ? A bulge that is painful when you touch it.  You are a man, and your scrotum: ? Suddenly feels painful. ? Suddenly changes in size.  You cannot push the hernia in by very gently pressing on it when you are lying down. Do not try to force the bulge back in if it will not push in easily.  You feel sick to your stomach (nauseous), and that feeling does not go away.  You throw up (vomit), and that keeps happening.  You have a fast heartbeat.  You cannot poop (have a bowel movement) or pass gas. These symptoms may be an emergency. Do not wait to see if the symptoms will go away. Get medical help right away. Call your local emergency services (911 in the [place] Summary  An inguinal hernia is when fat or your intestines push through  a weak spot in a muscle where your leg meets your lower belly (groin). This causes a rounded lump (bulge).  If you do not have symptoms, you may not need treatment. If you have symptoms or a large hernia, you may need surgery.  Avoid lifting heavy objects. Also avoid standing for long amounts of time.  Do not try to force the bulge back in if it will not push in easily. This information is not intended to replace advice given to you by your health care provider. Make sure you discuss any questions you have with your health care provider. Document Revised: 05/08/2017 Document Reviewed: 01/06/2017 Elsevier Patient  Education  [place].

## 2020-03-28 NOTE — Progress Notes (Signed)
Patient ID: Timothy Goodwin, male   DOB: [birth year], 85 y.o.   MRN: [id number]  Chief Complaint: Left inguinal hernia  History of Present Illness Timothy Goodwin is a 85 y.o. male with a prior presentation to the ED with what was presumed to be a kidney stone.  However his CT scan showed an incarcerated left inguinal hernia.  He had sigmoid colon within the hernia, and he was subsequently reduced after my personal attempt.  Prior attempts by the ED physician were unsuccessful, but likely made progress.  This gentleman does not remember the reduction process likely due to the degree of sedation he received.  We discussed with him the pros of proceeding with scheduling an elective repair, the technique planned.  He has a rather asymptomatic umbilical hernia as well.  He would like to defer till next year, to consider this elective procedure.  We did discuss potential reincarceration and the measures to take to try to reduce it on his own prior to an ED presentation.  Past Medical History Past Medical History:  Diagnosis Date  . Actinic keratosis   . Basal cell carcinoma 11/02/2019   upper back spinal   . Hypertension       Past Surgical History:  Procedure Laterality Date  . heart stent placement    . LEFT HEART CATH AND CORONARY ANGIOGRAPHY N/A 03/30/2018   Procedure: LEFT HEART CATH AND CORONARY ANGIOGRAPHY and possible PCI and stent;  Surgeon: Yolonda Kida, MD;  Location: [facility];  Service: Cardiovascular;  Laterality: N/A;  . PROSTATE SURGERY      Allergies  Allergen Reactions  . Aspirin Shortness Of Breath  . Lisinopril Shortness Of Breath  . Ace Inhibitors Other (See Comments)  . Nsaids Swelling  . Sulfa Antibiotics Swelling and Rash    Current Outpatient Medications  Medication Sig Dispense Refill  . allopurinol (ZYLOPRIM) 100 MG tablet TAKE 1 TABLET DAILY    . amLODipine (NORVASC) 10 MG tablet Take 10 mg by mouth daily.     Marland Kitchen atorvastatin (LIPITOR) 40 MG  tablet     . calcium carbonate 1250 MG capsule Take 1,250 mg by mouth daily.     . clopidogrel (PLAVIX) 75 MG tablet Take by mouth.    . co-enzyme Q-10 30 MG capsule Take 30 mg by mouth 3 (three) times daily.    . magnesium 30 MG tablet Take 30 mg by mouth 2 (two) times daily.    . metoprolol tartrate (LOPRESSOR) 25 MG tablet Take 25 mg by mouth 2 (two) times daily.     . Multiple Vitamin (MULTIVITAMIN) capsule Take 1 capsule by mouth daily.    . potassium chloride (MICRO-K) 10 MEQ CR capsule     . traMADol (ULTRAM) 50 MG tablet Take 1 tablet (50 mg total) by mouth every 6 (six) hours as needed. 20 tablet 0  . TURMERIC PO Take by mouth.    . vitamin C (ASCORBIC ACID) 500 MG tablet Take by mouth.    . Vitamin D, Ergocalciferol, (DRISDOL) 50000 units CAPS capsule     . vitamin E 200 UNIT capsule Take by mouth.    Marland Kitchen amiodarone (PACERONE) 200 MG tablet  (Patient not taking: No sig reported)    . cloNIDine (CATAPRES) 0.1 MG tablet Take 0.1 mg by mouth 2 (two) times daily.     . furosemide (LASIX) 20 MG tablet  (Patient not taking: No sig reported)    . heparin 25000-0.45 UT/250ML-% infusion Inject 1,000 Units/hr  into the vein continuous. (Patient not taking: No sig reported) 10 mL 0  . isosorbide mononitrate (IMDUR) 60 MG 24 hr tablet Take 1 tablet (60 mg total) by mouth daily. (Patient not taking: No sig reported) 30 tablet 0  . pantoprazole (PROTONIX) 40 MG tablet  (Patient not taking: No sig reported)     No current facility-administered medications for this visit.    Family History History reviewed. No pertinent family history.    Social History Social History   Tobacco Use  . Smoking status: Never Smoker  . Smokeless tobacco: Never Used  Substance Use Topics  . Alcohol use: Yes    Comment: occasionally  . Drug use: No        Review of Systems  Constitutional: Negative.   HENT: Negative.   Eyes: Negative.   Respiratory: Negative.   Cardiovascular: Negative.    Gastrointestinal: Negative.   Genitourinary: Negative.   Skin: Negative.   Neurological: Negative.   Psychiatric/Behavioral: Negative.       Physical Exam Blood pressure (!) 162/66, pulse 86, temperature (!) 97.5 F (36.4 C), temperature source Oral, height 5\' 5"  (1.651 m), weight 171 lb 9.6 oz (77.8 kg), SpO2 97 %. Last Weight  Most recent update: 03/28/2020  9:10 AM   Weight  77.8 kg (171 lb 9.6 oz)            CONSTITUTIONAL: Well developed, and nourished, appropriately responsive and aware without distress.   EYES: Sclera non-icteric.   EARS, NOSE, MOUTH AND THROAT: Mask worn.    Hearing is intact to voice.  NECK: Trachea is midline, and there is no jugular venous distension.  LYMPH NODES:  Lymph nodes in the neck are not enlarged. RESPIRATORY:  Lungs are clear, and breath sounds are equal bilaterally. Normal respiratory effort without pathologic use of accessory muscles. CARDIOVASCULAR: Heart is regular in rate and rhythm. GI: The abdomen is soft, nontender, and nondistended.  There is a readily reducible umbilical fascial defect, estimated size 1.5 to 2 cm.  There were no palpable masses. I did not appreciate hepatosplenomegaly. There were normal bowel sounds. GU: Known left-sided inguinal hernia.  Soft and nontender today. MUSCULOSKELETAL:  Symmetrical muscle tone appreciated in all four extremities.    SKIN: Skin turgor is normal. No pathologic skin lesions appreciated.  NEUROLOGIC:  Motor and sensation appear grossly normal.  Cranial nerves are grossly without defect. PSYCH:  Alert and oriented to person, place and time. Affect is appropriate for situation.  Data Reviewed I have personally reviewed what is currently available of the patient's imaging, recent labs and medical records.   Labs:  CBC Latest Ref Rng & Units 03/08/2020 03/30/2018 03/29/2018  WBC 4.0 - 10.5 K/uL 7.6 5.9 6.1  Hemoglobin 13.0 - 17.0 g/dL 16.0 13.6 13.4  Hematocrit 39.0 - 52.0 % 47.7 41.6 41.6   Platelets 150 - 400 K/uL 166 222 222   CMP Latest Ref Rng & Units 03/08/2020 03/29/2018 03/28/2018  Glucose 70 - 99 mg/dL 152(H) 102(H) 156(H)  BUN 8 - 23 mg/dL 32(H) 26(H) 27(H)  Creatinine 0.61 - 1.24 mg/dL 2.08(H) 1.36(H) 1.75(H)  Sodium 135 - 145 mmol/L 141 141 141  Potassium 3.5 - 5.1 mmol/L 4.7 4.1 4.2  Chloride 98 - 111 mmol/L 107 111 108  CO2 22 - 32 mmol/L 22 23 25   Calcium 8.9 - 10.3 mg/dL 9.6 8.9 9.1  Total Protein 6.5 - 8.1 g/dL 7.8 - -  Total Bilirubin 0.3 - 1.2 mg/dL 0.9 - -  Alkaline Phos 38 - 126 U/L 74 - -  AST 15 - 41 U/L 30 - -  ALT 0 - 44 U/L 18 - -      Imaging: Radiology review:   CLINICAL DATA:  Left groin pain  EXAM: CT ABDOMEN AND PELVIS WITHOUT CONTRAST  TECHNIQUE: Multidetector CT imaging of the abdomen and pelvis was performed following the standard protocol without IV contrast.  COMPARISON:  None.  FINDINGS: Lower chest: Bandlike atelectasis within the left lung base. Tiny calcified granuloma in the right lung base. 2 mm ground-glass nodule within the periphery of the right middle lobe (series 4, image 7). Coronary artery calcification is present.  Hepatobiliary: The unenhanced liver is within normal limits. Multiple small layering gallstones within the gallbladder lumen. No pericholecystic inflammatory changes by CT.  Pancreas: Unremarkable. No pancreatic ductal dilatation or surrounding inflammatory changes.  Spleen: Normal in size without focal abnormality.  Adrenals/Urinary Tract: Unremarkable adrenal glands. Nonspecific perinephric stranding both kidneys. Mild left hydroureteronephrosis. There is a round 5 mm stone within the bladder lumen suggestive of a recently passed left renal stone. No additional calculi within the left kidney or ureter. No right-sided renal or ureteral stone.  Stomach/Bowel: Probable small sliding hiatal hernia. Stomach is otherwise unremarkable. No dilated loops of small bowel. A  normal appendix is present in the right lower quadrant (series 2, image 64). A loop of colon near the descending-sigmoid junction extends into large left inguinal hernia sac. There is mild circumferential wall thickening involving the colon within the hernia sac (series 2, image 97) without appreciable fat stranding or adjacent fluid. Additionally, a small portion of mid transverse colon protrudes into a small umbilical hernia (series 2, image 51) without wall thickening or fluid. Large air-containing diverticulum emanates from the rectosigmoid colon measuring approximately 2.0 cm.  Vascular/Lymphatic: Aortoiliac atherosclerosis without aneurysm. No abdominopelvic lymphadenopathy.  Reproductive: Prostate gland appears surgically absent.  Other: No ascites. No abdominopelvic fluid collection. No pneumoperitoneum.  Musculoskeletal: Mild lumbar dextrocurvature with advanced multilevel lumbar spondylosis. No acute osseous abnormality.  IMPRESSION: 1. Large left inguinal hernia containing a loop of colon near the descending-sigmoid junction. There is mild circumferential wall thickening involving the colon within the hernia sac raising possibility for bowel strangulation. Surgical evaluation is recommended. 2. Mild left hydroureteronephrosis. There is a 5 mm stone within the bladder lumen suggestive of a recently passed left renal stone. 3. Small portion of mid transverse colon protrudes into a small umbilical hernia without wall thickening or fluid. 4. Cholelithiasis without evidence of acute cholecystitis. 5. 2 mm ground-glass nodule within the periphery of the right middle lobe, which may reflect a small infectious or inflammatory nodule. No follow-up recommended. This recommendation follows the consensus statement: Guidelines for Management of Incidental Pulmonary Nodules Detected on CT Images: From the Fleischner Society 2017; Radiology 2017; 284:228-243.  Aortic  Atherosclerosis (ICD10-I70.0).   Electronically Signed   By: Davina Poke D.O.   On: 03/08/2020 08:54 Within last 24 hrs: No results found.  Assessment    Left inguinal hernia, umbilical hernia. Patient Active Problem List   Diagnosis Date Noted  . Hyperlipidemia 05/16/2018  . Atrial fibrillation ([place]) 04/09/2018  . ATN (acute tubular necrosis) ([place]) 04/07/2018  . 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 3-vessel coronary artery disease 03/31/2018  . NSTEMI (non-ST elevated myocardial infarction) ([place]) 03/29/2018  . Essential hypertension 06/05/2016  . Renal artery stenosis ([place]) 06/05/2016  . CKD (chronic kidney disease) stage 3, GFR 30-59 ml/min ([facility]) 11/12/2014  .  Cancer of prostate ([place]) 05/14/2014    Plan    We discussed elective repair.  I discussed possibility of re-incarceration, potential strangulation, enlargement in size over time, and the risk of emergency surgery in the face of strangulation.  Also discussed the risk of surgery including recurrence, use of prosthetic materials (mesh) and the increased risk of infxn, post-op infxn and the possible need for re-operation and removal of mesh if used, possibility of post-op SBO or ileus, and the risks of general anesthetic including MI, CVA, sudden death or even reaction to anesthetic medications. The patient and family understands the risks, any and all questions were answered to the patient's satisfaction.  Face-to-face time spent with the patient and accompanying care providers(if present) was 20 minutes, with more than 50% of the time spent counseling, educating, and coordinating care of the patient.      Ronny Bacon M.D., FACS 03/28/2020, 9:29 AM

## 2020-04-02 ENCOUNTER — Encounter: Payer: Self-pay | Admitting: Dermatology

## 2020-05-30 ENCOUNTER — Other Ambulatory Visit: Payer: Self-pay

## 2020-05-30 ENCOUNTER — Ambulatory Visit (INDEPENDENT_AMBULATORY_CARE_PROVIDER_SITE_OTHER): Payer: Medicare PPO

## 2020-05-30 DIAGNOSIS — L570 Actinic keratosis: Secondary | ICD-10-CM | POA: Diagnosis not present

## 2020-05-30 MED ORDER — AMINOLEVULINIC ACID HCL 20 % EX SOLR
1.0000 "application " | Freq: Once | CUTANEOUS | Status: AC
  Administered 2020-05-30: 354 mg via TOPICAL

## 2020-05-30 NOTE — Progress Notes (Signed)
1. AK (actinic keratosis) °Scalp ° °Photodynamic therapy - Scalp °Procedure discussed: discussed risks, benefits, side effects. and alternatives   °Prep: site scrubbed/prepped with acetone   °Location:  Scalp °Number of lesions:  Multiple °Type of treatment:  Blue light °Aminolevulinic Acid (see MAR for details): Levulan °Number of Levulan sticks used:  1 °Incubation time (minutes):  120 °Number of minutes under lamp:  16 °Number of seconds under lamp:  40 °Cooling:  Floor fan °Outcome: patient tolerated procedure well with no complications   °Post-procedure details: sunscreen applied and aftercare instructions given to patient   ° °Aminolevulinic Acid HCl 20 % SOLR 354 mg - Scalp ° ° °  °

## 2020-05-30 NOTE — Patient Instructions (Signed)

## 2020-09-25 ENCOUNTER — Ambulatory Visit (INDEPENDENT_AMBULATORY_CARE_PROVIDER_SITE_OTHER): Payer: Medicare PPO | Admitting: Dermatology

## 2020-09-25 ENCOUNTER — Other Ambulatory Visit: Payer: Self-pay

## 2020-09-25 DIAGNOSIS — L578 Other skin changes due to chronic exposure to nonionizing radiation: Secondary | ICD-10-CM | POA: Diagnosis not present

## 2020-09-25 DIAGNOSIS — D692 Other nonthrombocytopenic purpura: Secondary | ICD-10-CM

## 2020-09-25 DIAGNOSIS — L570 Actinic keratosis: Secondary | ICD-10-CM

## 2020-09-25 MED ORDER — CALCIPOTRIENE 0.005 % EX SOLN: CUTANEOUS | 0 refills | Status: DC

## 2020-09-25 MED ORDER — FLUOROURACIL 5 % EX CREA: TOPICAL_CREAM | CUTANEOUS | 0 refills | Status: DC

## 2020-09-25 NOTE — Patient Instructions (Addendum)

## 2020-09-25 NOTE — Progress Notes (Signed)
Follow-Up Visit   Subjective  Timothy Goodwin is a 86 y.o. male who presents for the following: Actinic Keratosis (S/P PDT on the scalp, patient is here today for recheck).  The following portions of the chart were reviewed this encounter and updated as appropriate:   Tobacco  Allergies  Meds  Problems  Med Hx  Surg Hx  Fam Hx      Review of Systems:  No other skin or systemic complaints except as noted in HPI or Assessment and Plan.  Objective  Well appearing patient in no apparent distress; mood and affect are within normal limits.  A focused examination was performed including the face, scalp, arms, and hands. Relevant physical exam findings are noted in the Assessment and Plan.  Scalp and forehead x 19 (19) Erythematous thin papules/macules with gritty scale.   Assessment & Plan  AK (actinic keratosis) (19) Scalp and forehead x 19  Destruction of lesion - Scalp and forehead x 19 Complexity: simple   Destruction method: cryotherapy   Informed consent: discussed and consent obtained   Timeout:  patient name, date of birth, surgical site, and procedure verified Lesion destroyed using liquid nitrogen: Yes   Region frozen until ice ball extended beyond lesion: Yes   Outcome: patient tolerated procedure well with no complications   Post-procedure details: wound care instructions given    fluorouracil (EFUDEX) 5 % cream - Scalp and forehead x 19 Apply twice daily to the forehead and scalp after applying Calcipotriene solution. Use for one week.  Calcipotriene 0.005 % solution - Scalp and forehead x 19 Apply to the forehead and scalp twice daily for one week.  Actinic Damage - Severe, confluent actinic changes with pre-cancerous actinic keratoses  - Severe, chronic, not at goal, secondary to cumulative UV radiation exposure over time - diffuse scaly erythematous macules and papules with underlying dyspigmentation - Discussed Prescription "Field Treatment" for Severe,  Chronic Confluent Actinic Changes with Pre-Cancerous Actinic Keratoses Field treatment involves treatment of an entire area of skin that has confluent Actinic Changes (Sun/ Ultraviolet light damage) and PreCancerous Actinic Keratoses by method of PhotoDynamic Therapy (PDT) and/or prescription Topical Chemotherapy agents such as 5-fluorouracil, 5-fluorouracil/calcipotriene, and/or imiquimod.  The purpose is to decrease the number of clinically evident and subclinical PreCancerous lesions to prevent progression to development of skin cancer by chemically destroying early precancer changes that may or may not be visible.  It has been shown to reduce the risk of developing skin cancer in the treated area. As a result of treatment, redness, scaling, crusting, and open sores may occur during treatment course. One or more than one of these methods may be used and may have to be used several times to control, suppress and eliminate the PreCancerous changes. Discussed treatment course, expected reaction, and possible side effects. - Recommend daily broad spectrum sunscreen SPF 30+ to sun-exposed areas, reapply every 2 hours as needed.  - Staying in the shade or wearing long sleeves, sun glasses (UVA+UVB protection) and wide brim hats (4-inch brim around the entire circumference of the hat) are also recommended. - Call for new or changing lesions. - Start 5FU and Calcipotriene BID x 1 week to the forehead and scalp.   Purpura - Chronic; persistent and recurrent.  Treatable, but not curable. - Violaceous macules and patches - Benign - Related to trauma, age, sun damage and/or use of blood thinners, chronic use of topical and/or oral steroids - Observe - Can use OTC arnica containing moisturizer such as  Dermend Bruise Formula if desired - Call for worsening or other concerns  Return in about 4 months (around 01/25/2021) for AK follow up .  Luther Redo, CMA, am acting as scribe for Sarina Ser, MD  .  Documentation: I have reviewed the above documentation for accuracy and completeness, and I agree with the above.  Sarina Ser, MD

## 2020-09-27 ENCOUNTER — Encounter: Payer: Self-pay | Admitting: Dermatology

## 2020-10-10 ENCOUNTER — Encounter: Payer: Self-pay | Admitting: Surgery

## 2020-10-10 ENCOUNTER — Ambulatory Visit (INDEPENDENT_AMBULATORY_CARE_PROVIDER_SITE_OTHER): Payer: Medicare PPO | Admitting: Surgery

## 2020-10-10 ENCOUNTER — Ambulatory Visit: Payer: Self-pay | Admitting: Surgery

## 2020-10-10 ENCOUNTER — Other Ambulatory Visit: Payer: Self-pay

## 2020-10-10 VITALS — BP 194/74 | HR 74 | Temp 98.6°F | Ht 65.0 in | Wt 165.6 lb

## 2020-10-10 DIAGNOSIS — K4090 Unilateral inguinal hernia, without obstruction or gangrene, not specified as recurrent: Secondary | ICD-10-CM

## 2020-10-10 DIAGNOSIS — K429 Umbilical hernia without obstruction or gangrene: Secondary | ICD-10-CM

## 2020-10-10 NOTE — Progress Notes (Signed)
Patient ID: Timothy Goodwin, male   DOB: 06-18-1934, 86 y.o.   MRN: [id number]  Chief Complaint: Left inguinal hernia  History of Present Illness 89 gentleman previously known to [place] through an ED evaluation, deferring elective repair due to COVID issues.  Now notes progression of his hernia extending into his scrotum again.  Denies any constipate of symptoms.  Also denies any spontaneous reduction or attempts at manual reduction.  He denies any significant increase in pain, continue to tolerate it quite well.  He is now interested in pursuing elective hernia repair.  He remains quite active doing elective walking on a track, denies any shortness of breath doing reasonable activity.  Denies any nausea, vomiting, fevers or chills or changes in bowel activity. Previously Timothy Goodwin is a 86 y.o. male with a prior presentation to the ED with what was presumed to be a kidney stone.  However his CT scan showed an incarcerated left inguinal hernia.  He had sigmoid colon within the hernia, and he was subsequently reduced after my personal attempt.  Prior attempts by the ED physician were unsuccessful, but likely made progress.  This gentleman does not remember the reduction process likely due to the degree of sedation he received.  We discussed with him the pros of proceeding with scheduling an elective repair, the technique planned.  He has a rather asymptomatic umbilical hernia as well.  He would like to defer till next year, to consider this elective procedure.  We did discuss potential reincarceration and the measures to take to try to reduce it on his own prior to an ED presentation.  Past Medical History Past Medical History:  Diagnosis Date   Actinic keratosis    Basal cell carcinoma 11/02/2019   upper back spinal    Hypertension       Past Surgical History:  Procedure Laterality Date   heart stent placement     LEFT HEART CATH AND CORONARY ANGIOGRAPHY N/A 03/30/2018   Procedure: LEFT HEART CATH  AND CORONARY ANGIOGRAPHY and possible PCI and stent;  Surgeon: Yolonda Kida, MD;  Location: [facility];  Service: Cardiovascular;  Laterality: N/A;   PROSTATE SURGERY      Allergies  Allergen Reactions   Aspirin Shortness Of Breath   Lisinopril Shortness Of Breath   Ace Inhibitors Other (See Comments)   Nsaids Swelling   Sulfa Antibiotics Swelling and Rash    Current Outpatient Medications  Medication Sig Dispense Refill   allopurinol (ZYLOPRIM) 100 MG tablet TAKE 1 TABLET DAILY     amiodarone (PACERONE) 200 MG tablet      amLODipine (NORVASC) 10 MG tablet Take 10 mg by mouth daily.      atorvastatin (LIPITOR) 40 MG tablet      Calcipotriene 0.005 % solution Apply to the forehead and scalp twice daily for one week. 60 mL 0   calcium carbonate 1250 MG capsule Take 1,250 mg by mouth daily.      cloNIDine (CATAPRES) 0.1 MG tablet Take 0.1 mg by mouth 2 (two) times daily.      clopidogrel (PLAVIX) 75 MG tablet Take by mouth.     co-enzyme Q-10 30 MG capsule Take 30 mg by mouth 3 (three) times daily.     fluorouracil (EFUDEX) 5 % cream Apply twice daily to the forehead and scalp after applying Calcipotriene solution. Use for one week. 40 g 0   furosemide (LASIX) 20 MG tablet      heparin 25000-0.45 UT/250ML-% infusion Inject  1,000 Units/hr into the vein continuous. 10 mL 0   isosorbide mononitrate (IMDUR) 60 MG 24 hr tablet Take 1 tablet (60 mg total) by mouth daily. 30 tablet 0   magnesium 30 MG tablet Take 30 mg by mouth 2 (two) times daily.     metoprolol tartrate (LOPRESSOR) 25 MG tablet Take 25 mg by mouth 2 (two) times daily.      Multiple Vitamin (MULTIVITAMIN) capsule Take 1 capsule by mouth daily.     pantoprazole (PROTONIX) 40 MG tablet      potassium chloride (MICRO-K) 10 MEQ CR capsule      traMADol (ULTRAM) 50 MG tablet Take 1 tablet (50 mg total) by mouth every 6 (six) hours as needed. 20 tablet 0   TURMERIC PO Take by mouth.     vitamin C (ASCORBIC ACID)  500 MG tablet Take by mouth.     Vitamin D, Ergocalciferol, (DRISDOL) 50000 units CAPS capsule      vitamin E 200 UNIT capsule Take by mouth.     No current facility-administered medications for this visit.    Family History History reviewed. No pertinent family history.    Social History Social History   Tobacco Use   Smoking status: Never   Smokeless tobacco: Never  Substance Use Topics   Alcohol use: Yes    Comment: occasionally   Drug use: No        Review of Systems  Constitutional: Negative.   HENT: Negative.    Eyes: Negative.   Respiratory: Negative.    Cardiovascular: Negative.   Gastrointestinal: Negative.   Genitourinary: Negative.   Skin: Negative.   Neurological: Negative.   Psychiatric/Behavioral: Negative.       Physical Exam Blood pressure (!) 194/74, pulse 74, temperature 98.6 F (37 C), temperature source Oral, height 5\' 5"  (1.651 m), weight 165 lb 9.6 oz (75.1 kg), SpO2 97 %. Last Weight  Most recent update: 10/10/2020  2:33 PM    Weight  75.1 kg (165 lb 9.6 oz)             CONSTITUTIONAL: Well developed, and nourished, appropriately responsive and aware without distress.   EYES: Sclera non-icteric.   EARS, NOSE, MOUTH AND THROAT: Mask worn.    Hearing is intact to voice.  NECK: Trachea is midline, and there is no jugular venous distension.  LYMPH NODES:  Lymph nodes in the neck are not enlarged. RESPIRATORY:  Lungs are clear, and breath sounds are equal bilaterally. Normal respiratory effort without pathologic use of accessory muscles. CARDIOVASCULAR: Heart is regular in rate and rhythm. GI: The abdomen is soft, nontender, and nondistended.  There is a readily reducible umbilical fascial defect, estimated size 1.5 to 2 cm.  There were no palpable masses. I did not appreciate hepatosplenomegaly. There were normal bowel sounds. GU: Known left-sided inguinal hernia.  Soft and nontender today. MUSCULOSKELETAL:  Symmetrical muscle tone  appreciated in all four extremities.    SKIN: Skin turgor is normal. No pathologic skin lesions appreciated.  NEUROLOGIC:  Motor and sensation appear grossly normal.  Cranial nerves are grossly without defect. PSYCH:  Alert and oriented to person, place and time. Affect is appropriate for situation.  Data Reviewed I have personally reviewed what is currently available of the patient's imaging, recent labs and medical records.   Labs:  CBC Latest Ref Rng & Units 03/08/2020 03/30/2018 03/29/2018  WBC 4.0 - 10.5 K/uL 7.6 5.9 6.1  Hemoglobin 13.0 - 17.0 g/dL 16.0 13.6 13.4  Hematocrit  39.0 - 52.0 % 47.7 41.6 41.6  Platelets 150 - 400 K/uL 166 222 222   CMP Latest Ref Rng & Units 03/08/2020 03/29/2018 03/28/2018  Glucose 70 - 99 mg/dL 152(H) 102(H) 156(H)  BUN 8 - 23 mg/dL 32(H) 26(H) 27(H)  Creatinine 0.61 - 1.24 mg/dL 2.08(H) 1.36(H) 1.75(H)  Sodium 135 - 145 mmol/L 141 141 141  Potassium 3.5 - 5.1 mmol/L 4.7 4.1 4.2  Chloride 98 - 111 mmol/L 107 111 108  CO2 22 - 32 mmol/L 22 23 25   Calcium 8.9 - 10.3 mg/dL 9.6 8.9 9.1  Total Protein 6.5 - 8.1 g/dL 7.8 - -  Total Bilirubin 0.3 - 1.2 mg/dL 0.9 - -  Alkaline Phos 38 - 126 U/L 74 - -  AST 15 - 41 U/L 30 - -  ALT 0 - 44 U/L 18 - -      Imaging: Radiology review:   CLINICAL DATA:  Left groin pain   EXAM: CT ABDOMEN AND PELVIS WITHOUT CONTRAST   TECHNIQUE: Multidetector CT imaging of the abdomen and pelvis was performed following the standard protocol without IV contrast.   COMPARISON:  None.   FINDINGS: Lower chest: Bandlike atelectasis within the left lung base. Tiny calcified granuloma in the right lung base. 2 mm ground-glass nodule within the periphery of the right middle lobe (series 4, image 7). Coronary artery calcification is present.   Hepatobiliary: The unenhanced liver is within normal limits. Multiple small layering gallstones within the gallbladder lumen. No pericholecystic inflammatory changes by CT.    Pancreas: Unremarkable. No pancreatic ductal dilatation or surrounding inflammatory changes.   Spleen: Normal in size without focal abnormality.   Adrenals/Urinary Tract: Unremarkable adrenal glands. Nonspecific perinephric stranding both kidneys. Mild left hydroureteronephrosis. There is a round 5 mm stone within the bladder lumen suggestive of a recently passed left renal stone. No additional calculi within the left kidney or ureter. No right-sided renal or ureteral stone.   Stomach/Bowel: Probable small sliding hiatal hernia. Stomach is otherwise unremarkable. No dilated loops of small bowel. A normal appendix is present in the right lower quadrant (series 2, image 64). A loop of colon near the descending-sigmoid junction extends into large left inguinal hernia sac. There is mild circumferential wall thickening involving the colon within the hernia sac (series 2, image 97) without appreciable fat stranding or adjacent fluid. Additionally, a small portion of mid transverse colon protrudes into a small umbilical hernia (series 2, image 51) without wall thickening or fluid. Large air-containing diverticulum emanates from the rectosigmoid colon measuring approximately 2.0 cm.   Vascular/Lymphatic: Aortoiliac atherosclerosis without aneurysm. No abdominopelvic lymphadenopathy.   Reproductive: Prostate gland appears surgically absent.   Other: No ascites. No abdominopelvic fluid collection. No pneumoperitoneum.   Musculoskeletal: Mild lumbar dextrocurvature with advanced multilevel lumbar spondylosis. No acute osseous abnormality.   IMPRESSION: 1. Large left inguinal hernia containing a loop of colon near the descending-sigmoid junction. There is mild circumferential wall thickening involving the colon within the hernia sac raising possibility for bowel strangulation. Surgical evaluation is recommended. 2. Mild left hydroureteronephrosis. There is a 5 mm stone within  the bladder lumen suggestive of a recently passed left renal stone. 3. Small portion of mid transverse colon protrudes into a small umbilical hernia without wall thickening or fluid. 4. Cholelithiasis without evidence of acute cholecystitis. 5. 2 mm ground-glass nodule within the periphery of the right middle lobe, which may reflect a small infectious or inflammatory nodule. No follow-up recommended. This recommendation follows the consensus  statement: Guidelines for Management of Incidental Pulmonary Nodules Detected on CT Images: From the Fleischner Society 2017; Radiology 2017; 284:228-243.   Aortic Atherosclerosis (ICD10-I70.0).     Electronically Signed   By: Davina Poke D.O.   On: 03/08/2020 08:54 Within last 24 hrs: No results found.  Assessment    Left inguinal hernia, umbilical hernia. Patient Active Problem List   Diagnosis Date Noted   Non-recurrent unilateral inguinal hernia without obstruction or gangrene 58/12/9831   Umbilical hernia without obstruction and without gangrene 03/28/2020   Hyperlipidemia 05/16/2018   Atrial fibrillation ([place]) 04/09/2018   ATN (acute tubular necrosis) ([place]) 04/07/2018   S/P CABG x 2 04/05/2018   3-vessel coronary artery disease 03/31/2018   NSTEMI (non-ST elevated myocardial infarction) ([place]) 03/29/2018   Essential hypertension 06/05/2016   Renal artery stenosis ([place]) 06/05/2016   CKD (chronic kidney disease) stage 3, GFR 30-59 ml/min ([place]) 11/12/2014   Cancer of prostate ([place]) 05/14/2014    Plan    Robotic left inguinal hernia repair Will need to hold his Plavix for 5 days preop. I discussed possibility of re-incarceration, potential strangulation, enlargement in size over time, and the risk of emergency surgery in the face of strangulation.  Also discussed the risk of surgery including recurrence, use of prosthetic materials (mesh) and the increased risk of infxn, post-op infxn and the possible need for re-operation and  removal of mesh if used, possibility of post-op SBO or ileus, and the risks of general anesthetic including MI, CVA, sudden death or even reaction to anesthetic medications. The patient and family understands the risks, any and all questions were answered to the patient's satisfaction.  Face-to-face time spent with the patient and accompanying care providers(if present) was 30 minutes, with more than 50% of the time spent counseling, educating, and coordinating care of the patient.      Ronny Bacon M.D., FACS 10/10/2020, 2:59 PM

## 2020-10-10 NOTE — Patient Instructions (Addendum)
Our surgery scheduler Pamala Hurry will call you within 24-48 hours to get you scheduled. If you have not heard from her after 48 hours, please call our office. You will not need to get Covid tested before surgery and have the blue sheet available when she calls to write down important information.   If you have any concerns or questions, please feel free to call our office.    Inguinal Hernia, Adult An inguinal hernia develops when fat or the intestines push through a weak spot in a muscle where the leg meets the lower abdomen (groin). This creates a bulge. This kind of hernia could also be: In the scrotum, if you are male. In folds of skin around the vagina, if you are male. There are three types of inguinal hernias: Hernias that can be pushed back into the abdomen (are reducible). This type rarely causes pain. Hernias that are not reducible (are incarcerated). Hernias that are not reducible and lose their blood supply (are strangulated). This type of hernia requires emergency surgery. What are the causes? This condition is caused by having a weak spot in the muscles or tissues in your groin. This develops over time. The hernia may poke through the weak spot when you suddenly strain your lower abdominal muscles, such as when you: Lift a heavy object. Strain to have a bowel movement. Constipation can lead to straining. Cough. What increases the risk? This condition is more likely to develop in: Males. Pregnant females. People who: Are overweight. Work in jobs that require long periods of standing or heavy lifting. Have had an inguinal hernia before. Smoke or have lung disease. These factors can lead to long-term (chronic) coughing. What are the signs or symptoms? Symptoms may depend on the size of the hernia. Often, a small inguinal hernia has no symptoms. Symptoms of a larger hernia may include: A bulge in the groin area. This is easier to see when standing. It might not be visible when  lying down. Pain or burning in the groin. This may get worse when lifting, straining, or coughing. A dull ache or a feeling of pressure in the groin. An unusual bulge in the scrotum, in males. Symptoms of a strangulated inguinal hernia may include: A bulge in your groin that is very painful and tender to the touch. A bulge that turns red or purple. Fever, nausea, and vomiting. Inability to have a bowel movement or to pass gas. How is this diagnosed? This condition is diagnosed based on your symptoms, your medical history, and a physical exam. Your health care provider may feel your groin area and ask youto cough. How is this treated? Treatment depends on the size of your hernia and whether you have symptoms. If you do not have symptoms, your health care provider may have you watch your hernia carefully and have you come in for follow-up visits. If your hernia islarge or if you have symptoms, you may need surgery to repair the hernia. Follow these instructions at home: Lifestyle Avoid lifting heavy objects. Avoid standing for long periods of time. Do not use any products that contain nicotine or tobacco. These products include cigarettes, chewing tobacco, and vaping devices, such as e-cigarettes. If you need help quitting, ask your health care provider. Maintain a healthy weight. Preventing constipation You may need to take these actions to prevent or treat constipation: Drink enough fluid to keep your urine pale yellow. Take over-the-counter or prescription medicines. Eat foods that are high in fiber, such as beans, whole  grains, and fresh fruits and vegetables. Limit foods that are high in fat and processed sugars, such as fried or sweet foods. General instructions You may try to push the hernia back in place by very gently pressing on it while lying down. Do not try to force the bulge back in if it will not push in easily. Watch your hernia for any changes in shape, size, or color. Get  help right away if you notice any changes. Take over-the-counter and prescription medicines only as told by your health care provider. Keep all follow-up visits. This is important. Contact a health care provider if: You have a fever or chills. You develop new symptoms. Your symptoms get worse. Get help right away if: You have pain in your groin that suddenly gets worse. You have a bulge in your groin that: Suddenly gets bigger and does not get smaller. Becomes red or purple or painful to the touch. You are a man and you have a sudden pain in your scrotum, or the size of your scrotum suddenly changes. You cannot push the hernia back in place by very gently pressing on it when you are lying down. You have nausea or vomiting that does not go away. You have a fast heartbeat. You cannot have a bowel movement or pass gas. These symptoms may represent a serious problem that is an emergency. Do not wait to see if the symptoms will go away. Get medical help right away. Call your local emergency services (911 in the [place] Summary An inguinal hernia develops when fat or the intestines push through a weak spot in a muscle where your leg meets your lower abdomen (groin). This condition is caused by having a weak spot in muscles or tissues in your groin. Symptoms may depend on the size of the hernia, and they may include pain or swelling in your groin. A small inguinal hernia often has no symptoms. Treatment may not be needed if you do not have symptoms. If you have symptoms or a large hernia, you may need surgery to repair the hernia. Avoid lifting heavy objects. Also, avoid standing for long periods of time. This information is not intended to replace advice given to you by your health care provider. Make sure you discuss any questions you have with your healthcare provider. Document Revised: 12/05/2019 Document Reviewed: 12/05/2019 Elsevier Patient Education  2022 [employer].

## 2020-10-10 NOTE — H&P (View-Only) (Signed)
Patient ID: Timothy Goodwin, male   DOB: 20-Feb-1935, 86 y.o.   MRN: [id number]  Chief Complaint: Left inguinal hernia  History of Present Illness 49 gentleman previously known to [place] through an ED evaluation, deferring elective repair due to COVID issues.  Now notes progression of his hernia extending into his scrotum again.  Denies any constipate of symptoms.  Also denies any spontaneous reduction or attempts at manual reduction.  He denies any significant increase in pain, continue to tolerate it quite well.  He is now interested in pursuing elective hernia repair.  He remains quite active doing elective walking on a track, denies any shortness of breath doing reasonable activity.  Denies any nausea, vomiting, fevers or chills or changes in bowel activity. Previously Timothy Goodwin is a 86 y.o. male with a prior presentation to the ED with what was presumed to be a kidney stone.  However his CT scan showed an incarcerated left inguinal hernia.  He had sigmoid colon within the hernia, and he was subsequently reduced after my personal attempt.  Prior attempts by the ED physician were unsuccessful, but likely made progress.  This gentleman does not remember the reduction process likely due to the degree of sedation he received.  We discussed with him the pros of proceeding with scheduling an elective repair, the technique planned.  He has a rather asymptomatic umbilical hernia as well.  He would like to defer till next year, to consider this elective procedure.  We did discuss potential reincarceration and the measures to take to try to reduce it on his own prior to an ED presentation.  Past Medical History Past Medical History:  Diagnosis Date   Actinic keratosis    Basal cell carcinoma 11/02/2019   upper back spinal    Hypertension       Past Surgical History:  Procedure Laterality Date   heart stent placement     LEFT HEART CATH AND CORONARY ANGIOGRAPHY N/A 03/30/2018   Procedure: LEFT HEART CATH  AND CORONARY ANGIOGRAPHY and possible PCI and stent;  Surgeon: Yolonda Kida, MD;  Location: [facility];  Service: Cardiovascular;  Laterality: N/A;   PROSTATE SURGERY      Allergies  Allergen Reactions   Aspirin Shortness Of Breath   Lisinopril Shortness Of Breath   Ace Inhibitors Other (See Comments)   Nsaids Swelling   Sulfa Antibiotics Swelling and Rash    Current Outpatient Medications  Medication Sig Dispense Refill   allopurinol (ZYLOPRIM) 100 MG tablet TAKE 1 TABLET DAILY     amiodarone (PACERONE) 200 MG tablet      amLODipine (NORVASC) 10 MG tablet Take 10 mg by mouth daily.      atorvastatin (LIPITOR) 40 MG tablet      Calcipotriene 0.005 % solution Apply to the forehead and scalp twice daily for one week. 60 mL 0   calcium carbonate 1250 MG capsule Take 1,250 mg by mouth daily.      cloNIDine (CATAPRES) 0.1 MG tablet Take 0.1 mg by mouth 2 (two) times daily.      clopidogrel (PLAVIX) 75 MG tablet Take by mouth.     co-enzyme Q-10 30 MG capsule Take 30 mg by mouth 3 (three) times daily.     fluorouracil (EFUDEX) 5 % cream Apply twice daily to the forehead and scalp after applying Calcipotriene solution. Use for one week. 40 g 0   furosemide (LASIX) 20 MG tablet      heparin 25000-0.45 UT/250ML-% infusion Inject  1,000 Units/hr into the vein continuous. 10 mL 0   isosorbide mononitrate (IMDUR) 60 MG 24 hr tablet Take 1 tablet (60 mg total) by mouth daily. 30 tablet 0   magnesium 30 MG tablet Take 30 mg by mouth 2 (two) times daily.     metoprolol tartrate (LOPRESSOR) 25 MG tablet Take 25 mg by mouth 2 (two) times daily.      Multiple Vitamin (MULTIVITAMIN) capsule Take 1 capsule by mouth daily.     pantoprazole (PROTONIX) 40 MG tablet      potassium chloride (MICRO-K) 10 MEQ CR capsule      traMADol (ULTRAM) 50 MG tablet Take 1 tablet (50 mg total) by mouth every 6 (six) hours as needed. 20 tablet 0   TURMERIC PO Take by mouth.     vitamin C (ASCORBIC ACID)  500 MG tablet Take by mouth.     Vitamin D, Ergocalciferol, (DRISDOL) 50000 units CAPS capsule      vitamin E 200 UNIT capsule Take by mouth.     No current facility-administered medications for this visit.    Family History History reviewed. No pertinent family history.    Social History Social History   Tobacco Use   Smoking status: Never   Smokeless tobacco: Never  Substance Use Topics   Alcohol use: Yes    Comment: occasionally   Drug use: No        Review of Systems  Constitutional: Negative.   HENT: Negative.    Eyes: Negative.   Respiratory: Negative.    Cardiovascular: Negative.   Gastrointestinal: Negative.   Genitourinary: Negative.   Skin: Negative.   Neurological: Negative.   Psychiatric/Behavioral: Negative.       Physical Exam Blood pressure (!) 194/74, pulse 74, temperature 98.6 F (37 C), temperature source Oral, height 5\' 5"  (1.651 m), weight 165 lb 9.6 oz (75.1 kg), SpO2 97 %. Last Weight  Most recent update: 10/10/2020  2:33 PM    Weight  75.1 kg (165 lb 9.6 oz)             CONSTITUTIONAL: Well developed, and nourished, appropriately responsive and aware without distress.   EYES: Sclera non-icteric.   EARS, NOSE, MOUTH AND THROAT: Mask worn.    Hearing is intact to voice.  NECK: Trachea is midline, and there is no jugular venous distension.  LYMPH NODES:  Lymph nodes in the neck are not enlarged. RESPIRATORY:  Lungs are clear, and breath sounds are equal bilaterally. Normal respiratory effort without pathologic use of accessory muscles. CARDIOVASCULAR: Heart is regular in rate and rhythm. GI: The abdomen is soft, nontender, and nondistended.  There is a readily reducible umbilical fascial defect, estimated size 1.5 to 2 cm.  There were no palpable masses. I did not appreciate hepatosplenomegaly. There were normal bowel sounds. GU: Known left-sided inguinal hernia.  Soft and nontender today. MUSCULOSKELETAL:  Symmetrical muscle tone  appreciated in all four extremities.    SKIN: Skin turgor is normal. No pathologic skin lesions appreciated.  NEUROLOGIC:  Motor and sensation appear grossly normal.  Cranial nerves are grossly without defect. PSYCH:  Alert and oriented to person, place and time. Affect is appropriate for situation.  Data Reviewed I have personally reviewed what is currently available of the patient's imaging, recent labs and medical records.   Labs:  CBC Latest Ref Rng & Units 03/08/2020 03/30/2018 03/29/2018  WBC 4.0 - 10.5 K/uL 7.6 5.9 6.1  Hemoglobin 13.0 - 17.0 g/dL 16.0 13.6 13.4  Hematocrit  39.0 - 52.0 % 47.7 41.6 41.6  Platelets 150 - 400 K/uL 166 222 222   CMP Latest Ref Rng & Units 03/08/2020 03/29/2018 03/28/2018  Glucose 70 - 99 mg/dL 152(H) 102(H) 156(H)  BUN 8 - 23 mg/dL 32(H) 26(H) 27(H)  Creatinine 0.61 - 1.24 mg/dL 2.08(H) 1.36(H) 1.75(H)  Sodium 135 - 145 mmol/L 141 141 141  Potassium 3.5 - 5.1 mmol/L 4.7 4.1 4.2  Chloride 98 - 111 mmol/L 107 111 108  CO2 22 - 32 mmol/L 22 23 25   Calcium 8.9 - 10.3 mg/dL 9.6 8.9 9.1  Total Protein 6.5 - 8.1 g/dL 7.8 - -  Total Bilirubin 0.3 - 1.2 mg/dL 0.9 - -  Alkaline Phos 38 - 126 U/L 74 - -  AST 15 - 41 U/L 30 - -  ALT 0 - 44 U/L 18 - -      Imaging: Radiology review:   CLINICAL DATA:  Left groin pain   EXAM: CT ABDOMEN AND PELVIS WITHOUT CONTRAST   TECHNIQUE: Multidetector CT imaging of the abdomen and pelvis was performed following the standard protocol without IV contrast.   COMPARISON:  None.   FINDINGS: Lower chest: Bandlike atelectasis within the left lung base. Tiny calcified granuloma in the right lung base. 2 mm ground-glass nodule within the periphery of the right middle lobe (series 4, image 7). Coronary artery calcification is present.   Hepatobiliary: The unenhanced liver is within normal limits. Multiple small layering gallstones within the gallbladder lumen. No pericholecystic inflammatory changes by CT.    Pancreas: Unremarkable. No pancreatic ductal dilatation or surrounding inflammatory changes.   Spleen: Normal in size without focal abnormality.   Adrenals/Urinary Tract: Unremarkable adrenal glands. Nonspecific perinephric stranding both kidneys. Mild left hydroureteronephrosis. There is a round 5 mm stone within the bladder lumen suggestive of a recently passed left renal stone. No additional calculi within the left kidney or ureter. No right-sided renal or ureteral stone.   Stomach/Bowel: Probable small sliding hiatal hernia. Stomach is otherwise unremarkable. No dilated loops of small bowel. A normal appendix is present in the right lower quadrant (series 2, image 64). A loop of colon near the descending-sigmoid junction extends into large left inguinal hernia sac. There is mild circumferential wall thickening involving the colon within the hernia sac (series 2, image 97) without appreciable fat stranding or adjacent fluid. Additionally, a small portion of mid transverse colon protrudes into a small umbilical hernia (series 2, image 51) without wall thickening or fluid. Large air-containing diverticulum emanates from the rectosigmoid colon measuring approximately 2.0 cm.   Vascular/Lymphatic: Aortoiliac atherosclerosis without aneurysm. No abdominopelvic lymphadenopathy.   Reproductive: Prostate gland appears surgically absent.   Other: No ascites. No abdominopelvic fluid collection. No pneumoperitoneum.   Musculoskeletal: Mild lumbar dextrocurvature with advanced multilevel lumbar spondylosis. No acute osseous abnormality.   IMPRESSION: 1. Large left inguinal hernia containing a loop of colon near the descending-sigmoid junction. There is mild circumferential wall thickening involving the colon within the hernia sac raising possibility for bowel strangulation. Surgical evaluation is recommended. 2. Mild left hydroureteronephrosis. There is a 5 mm stone within  the bladder lumen suggestive of a recently passed left renal stone. 3. Small portion of mid transverse colon protrudes into a small umbilical hernia without wall thickening or fluid. 4. Cholelithiasis without evidence of acute cholecystitis. 5. 2 mm ground-glass nodule within the periphery of the right middle lobe, which may reflect a small infectious or inflammatory nodule. No follow-up recommended. This recommendation follows the consensus  statement: Guidelines for Management of Incidental Pulmonary Nodules Detected on CT Images: From the Fleischner Society 2017; Radiology 2017; 284:228-243.   Aortic Atherosclerosis (ICD10-I70.0).     Electronically Signed   By: Davina Poke D.O.   On: 03/08/2020 08:54 Within last 24 hrs: No results found.  Assessment    Left inguinal hernia, umbilical hernia. Patient Active Problem List   Diagnosis Date Noted   Non-recurrent unilateral inguinal hernia without obstruction or gangrene 95/12/3265   Umbilical hernia without obstruction and without gangrene 03/28/2020   Hyperlipidemia 05/16/2018   Atrial fibrillation ([place]) 04/09/2018   ATN (acute tubular necrosis) ([place]) 04/07/2018   S/P CABG x 2 04/05/2018   3-vessel coronary artery disease 03/31/2018   NSTEMI (non-ST elevated myocardial infarction) ([place]) 03/29/2018   Essential hypertension 06/05/2016   Renal artery stenosis ([place]) 06/05/2016   CKD (chronic kidney disease) stage 3, GFR 30-59 ml/min ([place]) 11/12/2014   Cancer of prostate ([place]) 05/14/2014    Plan    Robotic left inguinal hernia repair Will need to hold his Plavix for 5 days preop. I discussed possibility of re-incarceration, potential strangulation, enlargement in size over time, and the risk of emergency surgery in the face of strangulation.  Also discussed the risk of surgery including recurrence, use of prosthetic materials (mesh) and the increased risk of infxn, post-op infxn and the possible need for re-operation and  removal of mesh if used, possibility of post-op SBO or ileus, and the risks of general anesthetic including MI, CVA, sudden death or even reaction to anesthetic medications. The patient and family understands the risks, any and all questions were answered to the patient's satisfaction.  Face-to-face time spent with the patient and accompanying care providers(if present) was 30 minutes, with more than 50% of the time spent counseling, educating, and coordinating care of the patient.      Ronny Bacon M.D., FACS 10/10/2020, 2:59 PM

## 2020-10-11 ENCOUNTER — Other Ambulatory Visit: Payer: Self-pay | Admitting: Internal Medicine

## 2020-10-11 ENCOUNTER — Telehealth: Payer: Self-pay | Admitting: Surgery

## 2020-10-11 DIAGNOSIS — N5089 Other specified disorders of the male genital organs: Secondary | ICD-10-CM

## 2020-10-11 DIAGNOSIS — R1030 Lower abdominal pain, unspecified: Secondary | ICD-10-CM

## 2020-10-11 NOTE — Telephone Encounter (Signed)
Patient has been advised of Pre-Admission date/time, COVID Testing date and Surgery date.  Surgery Date: 10/30/20   Preadmission Testing Date: 10/24/20 (phone: 8:00 am to 1p) Covid Testing Date: Not needed.     Patient has been made aware to call [phone], between 1-3:00pm the day before surgery, to find out what time to arrive for surgery.

## 2020-10-15 ENCOUNTER — Ambulatory Visit
Admission: RE | Admit: 2020-10-15 | Discharge: 2020-10-15 | Disposition: A | Discharge status: Home / Self Care | Payer: Medicare PPO | Source: Ambulatory Visit | Attending: Internal Medicine | Admitting: Internal Medicine

## 2020-10-15 ENCOUNTER — Other Ambulatory Visit: Payer: Self-pay

## 2020-10-15 DIAGNOSIS — R1030 Lower abdominal pain, unspecified: Secondary | ICD-10-CM | POA: Diagnosis present

## 2020-10-15 DIAGNOSIS — N5089 Other specified disorders of the male genital organs: Secondary | ICD-10-CM | POA: Diagnosis not present

## 2020-10-22 ENCOUNTER — Telehealth: Payer: Self-pay | Admitting: Surgery

## 2020-10-22 NOTE — Telephone Encounter (Signed)
Pt called advising he is having a molar extracted tomorrow & will be placed on Amoxicillin for 10 days, but wanted to make sure Dr. Christian Mate was aware knowing he is scheduled for surgery next week.  Please contact him if there are any questions or concerns.  Thank you

## 2020-10-24 ENCOUNTER — Other Ambulatory Visit
Admission: RE | Admit: 2020-10-24 | Discharge: 2020-10-24 | Disposition: A | Discharge status: Home / Self Care | Payer: Medicare PPO | Source: Ambulatory Visit | Attending: Surgery | Admitting: Surgery

## 2020-10-24 ENCOUNTER — Encounter
Admission: RE | Admit: 2020-10-24 | Discharge: 2020-10-24 | Disposition: A | Discharge status: Home / Self Care | Payer: Medicare PPO | Source: Ambulatory Visit | Attending: Surgery | Admitting: Surgery

## 2020-10-24 ENCOUNTER — Other Ambulatory Visit: Payer: Self-pay

## 2020-10-24 DIAGNOSIS — I10 Essential (primary) hypertension: Secondary | ICD-10-CM | POA: Insufficient documentation

## 2020-10-24 DIAGNOSIS — Z01810 Encounter for preprocedural cardiovascular examination: Secondary | ICD-10-CM | POA: Diagnosis not present

## 2020-10-24 DIAGNOSIS — Z01818 Encounter for other preprocedural examination: Secondary | ICD-10-CM | POA: Diagnosis present

## 2020-10-24 HISTORY — DX: Gout, unspecified: M10.9

## 2020-10-24 HISTORY — DX: Unilateral inguinal hernia, without obstruction or gangrene, not specified as recurrent: K40.90

## 2020-10-24 HISTORY — DX: Hyperlipidemia, unspecified: E78.5

## 2020-10-24 HISTORY — DX: Chronic kidney disease, stage 3 unspecified: N18.30

## 2020-10-24 HISTORY — DX: Atherosclerotic heart disease of native coronary artery without angina pectoris: I25.10

## 2020-10-24 HISTORY — DX: Occlusion and stenosis of unspecified carotid artery: I65.29

## 2020-10-24 HISTORY — DX: Atherosclerosis of renal artery: I70.1

## 2020-10-24 HISTORY — DX: Peripheral vascular disease, unspecified: I73.9

## 2020-10-24 LAB — COMPREHENSIVE METABOLIC PANEL
ALT: 17 U/L (ref 0–44)
AST: 18 U/L (ref 15–41)
Albumin: 3.9 g/dL (ref 3.5–5.0)
Alkaline Phosphatase: 57 U/L (ref 38–126)
Anion gap: 9 (ref 5–15)
BUN: 38 mg/dL — ABNORMAL HIGH (ref 8–23)
CO2: 22 mmol/L (ref 22–32)
Calcium: 8.8 mg/dL — ABNORMAL LOW (ref 8.9–10.3)
Chloride: 110 mmol/L (ref 98–111)
Creatinine, Ser: 1.92 mg/dL — ABNORMAL HIGH (ref 0.61–1.24)
GFR, Estimated: 34 mL/min — ABNORMAL LOW (ref >60)
Glucose, Bld: 86 mg/dL (ref 70–99)
Potassium: 4.5 mmol/L (ref 3.5–5.1)
Sodium: 141 mmol/L (ref 135–145)
Total Bilirubin: 0.7 mg/dL (ref 0.3–1.2)
Total Protein: 6.9 g/dL (ref 6.5–8.1)

## 2020-10-24 LAB — CBC WITH DIFFERENTIAL/PLATELET
Abs Immature Granulocytes: 0.01 10*3/uL (ref 0.00–0.07)
Basophils Absolute: 0 10*3/uL (ref 0.0–0.1)
Basophils Relative: 1 %
Eosinophils Absolute: 0.3 10*3/uL (ref 0.0–0.5)
Eosinophils Relative: 5 %
HCT: 40.7 % (ref 39.0–52.0)
Hemoglobin: 14 g/dL (ref 13.0–17.0)
Immature Granulocytes: 0 %
Lymphocytes Relative: 15 %
Lymphs Abs: 0.9 10*3/uL (ref 0.7–4.0)
MCH: 31.3 pg (ref 26.0–34.0)
MCHC: 34.4 g/dL (ref 30.0–36.0)
MCV: 90.8 fL (ref 80.0–100.0)
Monocytes Absolute: 0.4 10*3/uL (ref 0.1–1.0)
Monocytes Relative: 6 %
Neutro Abs: 4.4 10*3/uL (ref 1.7–7.7)
Neutrophils Relative %: 73 %
Platelets: 201 10*3/uL (ref 150–400)
RBC: 4.48 MIL/uL (ref 4.22–5.81)
RDW: 13.1 % (ref 11.5–15.5)
WBC: 6 10*3/uL (ref 4.0–10.5)
nRBC: 0 % (ref 0.0–0.2)

## 2020-10-24 NOTE — Patient Instructions (Addendum)
Your procedure is scheduled on: October 30, 2020 Wednesday  Report to the Registration Desk on the 1st floor of the [facility]. To find out your arrival time, please call [phone] between 1PM - 3PM on: Tuesday October 29, 2020  REMEMBER: Instructions that are not followed completely may result in serious medical risk, up to and including death; or upon the discretion of your surgeon and anesthesiologist your surgery may need to be rescheduled.  DO NOT EAT OR DRINK after midnight the night before surgery.  No gum chewing, lozengers or hard candies.  TAKE THESE MEDICATIONS THE MORNING OF SURGERY WITH A SIP OF WATER: ALLOPURINOL AMLOPIDINE  CLONIDINE ATORVASTATIN METOPROLOL  LAST DOSE OF PLAVIX October 24, 2020 THURSDAY  One week prior to surgery: Stop Anti-inflammatories (NSAIDS) such as Advil, Aleve, Ibuprofen, Motrin, Naproxen, Naprosyn and Aspirin based products such as Excedrin, Goodys Powder, BC Powder. Stop ANY OVER THE COUNTER supplements until after surgery. You may however, continue to take Tylenol if needed for pain up until the day of surgery.  No Alcohol for 24 hours before or after surgery.  No Smoking including e-cigarettes for 24 hours prior to surgery.  No chewable tobacco products for at least 6 hours prior to surgery.  No nicotine patches on the day of surgery.  Do not use any "recreational" drugs for at least a week prior to your surgery.  Please be advised that the combination of cocaine and anesthesia may have negative outcomes, up to and including death. If you test positive for cocaine, your surgery will be cancelled.  On the morning of surgery brush your teeth with toothpaste and water, you may rinse your mouth with mouthwash if you wish. Do not swallow any toothpaste or mouthwash.  Do not wear jewelry, make-up, hairpins, clips or nail polish.  Do not wear lotions, powders, or perfumes OR DEODORANT   Do not shave body from the neck down 48 hours prior to  surgery just in case you cut yourself which could leave a site for infection.  Also, freshly shaved skin may become irritated if using the CHG soap.  Contact lenses, hearing aids and dentures may not be worn into surgery.  Do not bring valuables to the hospital. [facility] is not responsible for any missing/lost belongings or valuables.   Use CHG Soap  as directed on instruction sheet.  Notify your doctor if there is any change in your medical condition (cold, fever, infection).  Wear comfortable clothing (specific to your surgery type) to the hospital.  After surgery, you can help prevent lung complications by doing breathing exercises.  Take deep breaths and cough every 1-2 hours. Your doctor may order a device called an Incentive Spirometer to help you take deep breaths. When coughing or sneezing, hold a pillow firmly against your incision with both hands. This is called "splinting." Doing this helps protect your incision. It also decreases belly discomfort.  If you are being discharged the day of surgery, you will not be allowed to drive home. You will need a responsible adult (18 years or older) to drive you home and stay with you that night.   If you are taking public transportation, you will need to have a responsible adult (18 years or older) with you. Please confirm with your physician that it is acceptable to use public transportation.   Please call the [facility] at [phone] if you have any questions about these instructions.  Surgery Visitation Policy:  Patients undergoing a  surgery or procedure may have one family member or support person with them as long as that person is not COVID-19 positive or experiencing its symptoms.  That person may remain in the waiting area during the procedure.  Inpatient Visitation:    Visiting hours are 7 a.m. to 8 p.m. Inpatients will be allowed two visitors daily. The visitors may change each day during the  patient's stay. No visitors under the age of 101. Any visitor under the age of 51 must be accompanied by an adult. The visitor must pass COVID-19 screenings, use hand sanitizer when entering and exiting the patient's room and wear a mask at all times, including in the patient's room. Patients must also wear a mask when staff or their visitor are in the room. Masking is required regardless of vaccination status.

## 2020-10-25 ENCOUNTER — Encounter: Payer: Self-pay | Admitting: Surgery

## 2020-10-25 ENCOUNTER — Telehealth: Payer: Self-pay | Admitting: Surgery

## 2020-10-25 NOTE — Telephone Encounter (Signed)
Spoke with the patient and let him know that pre admit testing would be able to get these results.

## 2020-10-25 NOTE — Progress Notes (Signed)
Perioperative Services  Pre-Admission/Anesthesia Testing Clinical Review  Date: 10/28/20  Patient Demographics:  Name: Timothy Goodwin DOB:   1934/09/25 MRN:   [id number]  Planned Surgical Procedure(s):    Case: [id number] Date/Time: 10/30/20 1305   Procedure: XI ROBOTIC ASSISTED INGUINAL HERNIA REPAIR WITH MESH (Left)   Anesthesia type: General   Pre-op diagnosis: left inguinal hernia   Location: [facility] OR ROOM 07 / [facility] [facility] FOR ANESTHESIA GROUP   Surgeons: Ronny Bacon, MD     NOTE: Available PAT nursing documentation and vital signs have been reviewed. Clinical nursing staff has updated patient's PMH/PSHx, current medication list, and drug allergies/intolerances to ensure comprehensive history available to assist in medical decision making as it pertains to the aforementioned surgical procedure and anticipated anesthetic course. Extensive review of available clinical information performed. Timothy Goodwin PMH and PSHx updated with any diagnoses/procedures that  may have been inadvertently omitted during his intake with the pre-admission testing department's nursing staff.  Clinical Discussion:  Timothy Goodwin is a 86 y.o. male who is submitted for pre-surgical anesthesia review and clearance prior to him undergoing the above procedure. Patient has never been a smoker. Pertinent PMH includes: CAD (s/p CABG), NSTEMI x 2, postoperative atrial fibrillation, PVD, renal artery stenosis, carotid artery stenosis HTN, HLD, CKD-III, prostate cancer  Patient is followed by cardiology Clayborn Bigness, MD). He was last seen in the cardiology clinic on 04/30/2020; notes reviewed. At the time of his clinic visit, the patient denied any chest pain, shortness of breath, PND, orthopnea, palpitations, significant peripheral edema, vertiginous symptoms, or presyncope/syncope.  Patient with a PMH significant for cardiovascular diagnoses.  Patient underwent heart catheterization with PCI in 2000 at [facility].   Stents (unknown type) were placed to the OM1.  Patient suffered an NSTEMI on 12/25/2013.  Diagnostic heart catheterization at that time revealed 50% stenoses of the proximal, mid, and distal LAD.  There was also 90% proximal, 50% distal, and 25% in-stent restenosis of the OM1, in addition to a 50% stenosis of the mid RCA. Left ventricular systolic function was normal.  90% proximal stenosis of the RIGHT renal artery noted.  Cardiologist elected for conservative medical treatment.  Patient suffered a second NSTEMI on 03/30/2018.  Cardiac catheterization revealed multivessel CAD including a complete in-stent restenosis of the previous placed stent to OM1.  Intervention was deferred, and patient was transferred to tertiary care center for consideration of CABG procedure versus high risk intervention.  Patient underwent two-vessel CABG procedure on 04/05/2018 at [facility].  LIMA-LAD and SVG-PL bypass grafts were placed.  Procedure complicated by postoperative atrial fibrillation.  TTE performed on 04/09/2018 revealed normal left ventricular systolic function (LVEF >00%), moderate LVH, and trivial valvular insufficiency.  Patient remained in atrial fibrillation throughout the exam.  Underwent transesophageal echocardiogram with concurrent DCCV procedure on 04/10/2018 for postoperative atrial fibrillation.  Patient required a total of 3 cardioversions to restore a brief period of NSR before transitioning back to atrial arrhythmia.  Patient was subsequently started on an amiodarone drip, which ultimately had to be discontinued due to junctional rhythm with a rate in the 50s.  Patient was transitioned to oral amiodarone and beta-blocker was added.  Patient started on oral antiplatelet therapy (clopidogrel).  Blood pressure well controlled at 122/76 on currently prescribed CCB, alpha blocker, and beta-blocker therapies. Patient is on a statin for his HLD. Functional capacity, as defined by  DASI, is documented as being >/= 4 METS. No changes were made to patient's medication regimen.  Patient to follow-up with outpatient cardiology in 6 months or sooner if needed.  Patient is scheduled for an elective inguinal hernia repair on 10/30/2020 with Dr. Ronny Bacon, MD.  Given patient's past medical history significant for cardiovascular diagnoses, presurgical cardiac clearance was sought by the PAT team. Per cardiology, "this patient is optimized for surgery and may proceed with the planned procedural course with a MODERATE risk stratification".  Again, this patient is on daily antiplatelet therapy. He has been instructed on recommendations for holding his clopidogrel for 5 days prior to his procedure with plans to restart as soon as postoperative bleeding risk felt to be minimized by his attending surgeon. The patient has been instructed that his last dose of his anticoagulant will be on 10/24/2020.  Patient denies previous perioperative complications with anesthesia in the past. In review of the available records, it is noted that patient underwent a MAC anesthetic course at [facility] (AS[facility]) in 03/2018 without documented complications.   Vitals with BMI 10/24/2020 10/10/2020 03/28/2020  Height _0  _1  _2   Weight 160 lbs 165 lbs 10 oz 171 lbs 10 oz  BMI 26.63 93.81 82.99  Systolic - 371 696  Diastolic - 74 66  Pulse - 74 86    Providers/Specialists:   NOTE: Primary physician provider listed below. Patient may have been seen by APP or partner within same practice.   PROVIDER ROLE / SPECIALTY LAST Katy Apo, MD  General Surgery 10/10/2020  Tracie Harrier, MD  Primary Care Provider 10/07/2020  Katrine Coho, MD  Cardiology 04/30/2020   Allergies:  Aspirin, Lisinopril, Ace inhibitors, Nsaids, and Sulfa antibiotics  Current Home Medications:   No current facility-administered medications for this encounter.    allopurinol (ZYLOPRIM)  100 MG tablet   amLODipine (NORVASC) 2.5 MG tablet   Ascorbic Acid (VITAMIN C) 1000 MG tablet   atorvastatin (LIPITOR) 10 MG tablet   Calcium 200 MG TABS   cloNIDine (CATAPRES) 0.1 MG tablet   clopidogrel (PLAVIX) 75 MG tablet   Coenzyme Q10 200 MG capsule   metoprolol tartrate (LOPRESSOR) 25 MG tablet   Multiple Vitamin (MULTIVITAMIN) capsule   Turmeric 450 MG CAPS   vitamin E 200 UNIT capsule   Calcipotriene 0.005 % solution   fluorouracil (EFUDEX) 5 % cream   History:   Past Medical History:  Diagnosis Date   Actinic keratosis    Basal cell carcinoma 11/02/2019   upper back spinal    Carotid artery stenosis    CKD (chronic kidney disease), stage III (HCC)    Coronary artery disease    Gout    Hx of CABG 04/05/2018   2v; LIMA-LAD, SVG-PL   Hyperlipidemia    Hypertension    Left inguinal hernia    NSTEMI (non-ST elevated myocardial infarction) ([place]) 12/25/2013   NSTEMI (non-ST elevated myocardial infarction) ([place]) 03/30/2018   Peripheral vascular disease ([place])    Postoperative atrial fibrillation ([place])    Prostate cancer ([place]) 1995   Right renal artery stenosis [facility])    Past Surgical History:  Procedure Laterality Date   CARDIAC CATHETERIZATION N/A 12/27/2013   Location: [facility]; Surgeon: Katrine Coho, MD   CORONARY ANGIOPLASTY WITH STENT PLACEMENT N/A 2000   OM1 stents (unknown type) placed; Location: [facility] GRAFT N/A 04/05/2018   2v; LIMA-LAD, SVG-PL; Location: [facility]; Surgeon: Domenick Bookbinder, MD   LEFT HEART CATH AND CORONARY ANGIOGRAPHY N/A 03/30/2018   Procedure: LEFT HEART CATH  AND CORONARY ANGIOGRAPHY and possible PCI and stent;  Surgeon: Yolonda Kida, MD;  Location: [place] [facility];  Service: Cardiovascular;  Laterality: N/A;   PROSTATECTOMY  1995   SMALL BOWEL ENTEROSCOPY  2019   TEE WITH CARDIOVERSION N/A 04/10/2018   Required 3 cardioversions/shocks to briefly restore NSR; A.fib refractory; Location: [facility]    TONSILLECTOMY     No family history on file. Social History   Tobacco Use   Smoking status: Never   Smokeless tobacco: Never  Vaping Use   Vaping Use: Never used  Substance Use Topics   Alcohol use: Never   Drug use: No    Pertinent Clinical Results:  LABS: Labs reviewed: Acceptable for surgery.  No visits with results within 3 Day(s) from this visit.  Latest known visit with results is:  Hospital Outpatient Visit on 10/24/2020  Component Date Value Ref Range Status   WBC 10/24/2020 6.0  4.0 - 10.5 K/uL Final   RBC 10/24/2020 4.48  4.22 - 5.81 MIL/uL Final   Hemoglobin 10/24/2020 14.0  13.0 - 17.0 g/dL Final   HCT 10/24/2020 40.7  39.0 - 52.0 % Final   MCV 10/24/2020 90.8  80.0 - 100.0 fL Final   MCH 10/24/2020 31.3  26.0 - 34.0 pg Final   MCHC 10/24/2020 34.4  30.0 - 36.0 g/dL Final   RDW 10/24/2020 13.1  11.5 - 15.5 % Final   Platelets 10/24/2020 201  150 - 400 K/uL Final   nRBC 10/24/2020 0.0  0.0 - 0.2 % Final   Neutrophils Relative % 10/24/2020 73  % Final   Neutro Abs 10/24/2020 4.4  1.7 - 7.7 K/uL Final   Lymphocytes Relative 10/24/2020 15  % Final   Lymphs Abs 10/24/2020 0.9  0.7 - 4.0 K/uL Final   Monocytes Relative 10/24/2020 6  % Final   Monocytes Absolute 10/24/2020 0.4  0.1 - 1.0 K/uL Final   Eosinophils Relative 10/24/2020 5  % Final   Eosinophils Absolute 10/24/2020 0.3  0.0 - 0.5 K/uL Final   Basophils Relative 10/24/2020 1  % Final   Basophils Absolute 10/24/2020 0.0  0.0 - 0.1 K/uL Final   Immature Granulocytes 10/24/2020 0  % Final   Abs Immature Granulocytes 10/24/2020 0.01  0.00 - 0.07 K/uL Final   Performed at [facility], [place], [place] [address]   Sodium 10/24/2020 141  135 - 145 mmol/L Final   Potassium 10/24/2020 4.5  3.5 - 5.1 mmol/L Final   Chloride 10/24/2020 110  98 - 111 mmol/L Final   CO2 10/24/2020 22  22 - 32 mmol/L Final   Glucose, Bld 10/24/2020 86  70 - 99 mg/dL Final   Glucose reference range applies  only to samples taken after fasting for at least 8 hours.   BUN 10/24/2020 38 (A) 8 - 23 mg/dL Final   Creatinine, Ser 10/24/2020 1.92 (A) 0.61 - 1.24 mg/dL Final   Calcium 10/24/2020 8.8 (A) 8.9 - 10.3 mg/dL Final   Total Protein 10/24/2020 6.9  6.5 - 8.1 g/dL Final   Albumin 10/24/2020 3.9  3.5 - 5.0 g/dL Final   AST 10/24/2020 18  15 - 41 U/L Final   ALT 10/24/2020 17  0 - 44 U/L Final   Alkaline Phosphatase 10/24/2020 57  38 - 126 U/L Final   Total Bilirubin 10/24/2020 0.7  0.3 - 1.2 mg/dL Final   GFR, Estimated 10/24/2020 34 (A) >60 mL/min Final   Comment: (NOTE) Calculated using the  CKD-EPI Creatinine Equation (2021)    Anion gap 10/24/2020 9  5 - 15 Final   Performed at [facility], [address] [place], [place] [address]    ECG: Date: 10/24/2020 Time ECG obtained: 1332 PM Rate: 62 bpm Rhythm: normal sinus Axis (leads I and aVF): Normal Intervals: PR 152 ms. QRS 96 ms. QTc 454 ms. ST segment and T wave changes: Inferior ST and T wave abnormalities; non-acute Comparison: Similar to previous tracing obtained on 03/28/2018   IMAGING / PROCEDURES: TRANSESOPHAGEAL ECHOCARDIOGRAM AND DCCV on 04/10/2018 No cardiac source of emboli noted DCCV x 3 delivered to briefly restore NSR, however atrial fibrillation refractory. Patient started on amiodarone drip  TRANSTHORACIC ECHOCARDIOGRAM performed on 04/09/2018 LVEF >55% Normal left ventricular systolic function with moderate LVH Normal right ventricular systolic function Trivial MR and TR No AR or PR No valvular stenosis Severe left atrial enlargement No evidence of pericardial effusion  CORONARY ARTERY BYPASS GRAFTING on 04/05/2018 Two-vessel CABG procedure LIMA-LAD SVG-PL  LEFT HEART CATHETERIZATION AND CORONARY ANGIOGRAPHY performed on 03/30/2018 Overall normal left ventricular systolic function with ejection fraction of 60% Multivessel CAD 25% stenosis of the ostial to mid LM 99% stenosis of the  ostial to proximal LCx 100% stenosis of the ostial 1st marginal 50% stenosis of the ostial to proximal RCA 100% stenosis of the ramus intermedius 100% stenosis of the ostial ramus intermedius 80% stenosis of the mid LAD 70% stenosis of the ostial 1st diagonal 100% stenosis of the 1st LPL Ramus high OM with stent in place was totally occluded at the ostium with TIMI 0 flow RCA nondominant with moderate disease Intervention was deferred Consider transfer to tertiary care center for possible CABG versus high risk intervention   Impression and Plan:  Timothy Goodwin has been referred for pre-anesthesia review and clearance prior to him undergoing the planned anesthetic and procedural courses. Available labs, pertinent testing, and imaging results were personally reviewed by me. This patient has been appropriately cleared by cardiology with an overall MODERATE risk of significant perioperative cardiovascular complications.  Based on clinical review performed today (10/28/20), barring any significant acute changes in the patient's overall condition, it is anticipated that he will be able to proceed with the planned surgical intervention. Any acute changes in clinical condition may necessitate his procedure being postponed and/or cancelled. Patient will meet with anesthesia team (MD and/or CRNA) on the day of his procedure for preoperative evaluation/assessment. Questions regarding anesthetic course will be fielded at that time.   Pre-surgical instructions were reviewed with the patient during his PAT appointment and questions were fielded by PAT clinical staff. Patient was advised that if any questions or concerns arise prior to his procedure then he should return a call to PAT and/or his surgeon's office to discuss.  Honor Loh, MSN, APRN, FNP-C, CEN [facility]  Peri-operative Services Nurse Practitioner Phone: [phone] Fax: [phone] 10/28/20 5:04 PM  NOTE: This  note has been prepared using Dragon dictation software. Despite my best ability to proofread, there is always the potential that unintentional transcriptional errors may still occur from this process.

## 2020-10-25 NOTE — Telephone Encounter (Signed)
Pt called advising he is due to have surgery w/Dr. Christian Mate next Wed (7/13) but only just recv'd a call from his cardiologist office (Dr. Clayborn Bigness) indicating the req'd EKG was performed yesterday.  If needed, plz request this info by faxing our request to [phone].  The pt is hoping this will not delay his scheduled surgery.  Thank you

## 2020-10-30 ENCOUNTER — Other Ambulatory Visit: Payer: Self-pay

## 2020-10-30 ENCOUNTER — Ambulatory Visit: Payer: Medicare PPO | Admitting: Urgent Care

## 2020-10-30 ENCOUNTER — Ambulatory Visit
Admission: RE | Admit: 2020-10-30 | Discharge: 2020-10-30 | Disposition: A | Discharge status: Home / Self Care | Payer: Medicare PPO | Attending: Surgery | Admitting: Surgery

## 2020-10-30 ENCOUNTER — Encounter: Payer: Self-pay | Admitting: Surgery

## 2020-10-30 ENCOUNTER — Encounter
Admission: RE | Disposition: A | Discharge status: Home / Self Care | Payer: Self-pay | Source: Home / Self Care | Attending: Surgery

## 2020-10-30 DIAGNOSIS — I739 Peripheral vascular disease, unspecified: Secondary | ICD-10-CM | POA: Insufficient documentation

## 2020-10-30 DIAGNOSIS — Z888 Allergy status to other drugs, medicaments and biological substances status: Secondary | ICD-10-CM | POA: Diagnosis not present

## 2020-10-30 DIAGNOSIS — I701 Atherosclerosis of renal artery: Secondary | ICD-10-CM | POA: Insufficient documentation

## 2020-10-30 DIAGNOSIS — E785 Hyperlipidemia, unspecified: Secondary | ICD-10-CM | POA: Insufficient documentation

## 2020-10-30 DIAGNOSIS — K429 Umbilical hernia without obstruction or gangrene: Secondary | ICD-10-CM | POA: Insufficient documentation

## 2020-10-30 DIAGNOSIS — K4090 Unilateral inguinal hernia, without obstruction or gangrene, not specified as recurrent: Secondary | ICD-10-CM

## 2020-10-30 DIAGNOSIS — K4030 Unilateral inguinal hernia, with obstruction, without gangrene, not specified as recurrent: Secondary | ICD-10-CM | POA: Insufficient documentation

## 2020-10-30 DIAGNOSIS — I129 Hypertensive chronic kidney disease with stage 1 through stage 4 chronic kidney disease, or unspecified chronic kidney disease: Secondary | ICD-10-CM | POA: Diagnosis not present

## 2020-10-30 DIAGNOSIS — Z79899 Other long term (current) drug therapy: Secondary | ICD-10-CM | POA: Insufficient documentation

## 2020-10-30 DIAGNOSIS — N183 Chronic kidney disease, stage 3 unspecified: Secondary | ICD-10-CM | POA: Insufficient documentation

## 2020-10-30 DIAGNOSIS — Z886 Allergy status to analgesic agent status: Secondary | ICD-10-CM | POA: Diagnosis not present

## 2020-10-30 DIAGNOSIS — Z951 Presence of aortocoronary bypass graft: Secondary | ICD-10-CM | POA: Insufficient documentation

## 2020-10-30 DIAGNOSIS — I252 Old myocardial infarction: Secondary | ICD-10-CM | POA: Insufficient documentation

## 2020-10-30 DIAGNOSIS — Z7902 Long term (current) use of antithrombotics/antiplatelets: Secondary | ICD-10-CM | POA: Diagnosis not present

## 2020-10-30 DIAGNOSIS — I2510 Atherosclerotic heart disease of native coronary artery without angina pectoris: Secondary | ICD-10-CM | POA: Insufficient documentation

## 2020-10-30 DIAGNOSIS — Z882 Allergy status to sulfonamides status: Secondary | ICD-10-CM | POA: Diagnosis not present

## 2020-10-30 HISTORY — PX: XI ROBOTIC ASSISTED INGUINAL HERNIA REPAIR WITH MESH: SHX6706

## 2020-10-30 HISTORY — DX: Unspecified atrial fibrillation: I48.91

## 2020-10-30 SURGERY — REPAIR, HERNIA, INGUINAL, ROBOT-ASSISTED, LAPAROSCOPIC, USING MESH
Anesthesia: General | Laterality: Left

## 2020-10-30 MED ORDER — CHLORHEXIDINE GLUCONATE CLOTH 2 % EX PADS: 6.0000 | MEDICATED_PAD | Freq: Once | CUTANEOUS | Status: DC

## 2020-10-30 MED ORDER — BUPIVACAINE LIPOSOME 1.3 % IJ SUSP: 20.0000 mL | Freq: Once | INTRAMUSCULAR | Status: DC

## 2020-10-30 MED ORDER — PROPOFOL 10 MG/ML IV BOLUS
INTRAVENOUS | Status: DC | PRN
  Administered 2020-10-30: 150 mg via INTRAVENOUS
  Administered 2020-10-30: 30 mg via INTRAVENOUS

## 2020-10-30 MED ORDER — BUPIVACAINE-EPINEPHRINE (PF) 0.25% -1:200000 IJ SOLN
INTRAMUSCULAR | Status: DC | PRN
  Administered 2020-10-30: 30 mL via PERINEURAL

## 2020-10-30 MED ORDER — OXYCODONE HCL 5 MG PO TABS: 5.0000 mg | ORAL_TABLET | Freq: Once | ORAL | Status: AC | PRN

## 2020-10-30 MED ORDER — ONDANSETRON HCL 4 MG/2ML IJ SOLN
INTRAMUSCULAR | Status: DC | PRN
  Administered 2020-10-30: 4 mg via INTRAVENOUS

## 2020-10-30 MED ORDER — HYDROCODONE-ACETAMINOPHEN 5-325 MG PO TABS: 1.0000 | ORAL_TABLET | Freq: Four times a day (QID) | ORAL | 0 refills | Status: DC | PRN

## 2020-10-30 MED ORDER — FENTANYL CITRATE (PF) 100 MCG/2ML IJ SOLN
INTRAMUSCULAR | Status: AC
  Filled 2020-10-30: qty 2

## 2020-10-30 MED ORDER — FENTANYL CITRATE (PF) 100 MCG/2ML IJ SOLN
25.0000 ug | INTRAMUSCULAR | Status: DC | PRN
  Administered 2020-10-30: 25 ug via INTRAVENOUS

## 2020-10-30 MED ORDER — CHLORHEXIDINE GLUCONATE CLOTH 2 % EX PADS
6.0000 | MEDICATED_PAD | Freq: Once | CUTANEOUS | Status: DC
Start: 2020-10-30 — End: 2020-10-30

## 2020-10-30 MED ORDER — LACTATED RINGERS IV SOLN: INTRAVENOUS | Status: DC

## 2020-10-30 MED ORDER — SUGAMMADEX SODIUM 200 MG/2ML IV SOLN
INTRAVENOUS | Status: DC | PRN
  Administered 2020-10-30 (×2): 100 mg via INTRAVENOUS

## 2020-10-30 MED ORDER — ACETAMINOPHEN 500 MG PO TABS: 1000.0000 mg | ORAL_TABLET | ORAL | Status: AC

## 2020-10-30 MED ORDER — ROCURONIUM BROMIDE 100 MG/10ML IV SOLN
INTRAVENOUS | Status: DC | PRN
  Administered 2020-10-30: 20 mg via INTRAVENOUS

## 2020-10-30 MED ORDER — FENTANYL CITRATE (PF) 100 MCG/2ML IJ SOLN
INTRAMUSCULAR | Status: DC | PRN
  Administered 2020-10-30 (×3): 50 ug via INTRAVENOUS

## 2020-10-30 MED ORDER — PROPOFOL 10 MG/ML IV BOLUS
INTRAVENOUS | Status: AC
  Filled 2020-10-30: qty 20

## 2020-10-30 MED ORDER — ACETAMINOPHEN 500 MG PO TABS
ORAL_TABLET | ORAL | Status: AC
  Administered 2020-10-30: 1000 mg via ORAL
  Filled 2020-10-30: qty 2

## 2020-10-30 MED ORDER — CHLORHEXIDINE GLUCONATE 0.12 % MT SOLN
OROMUCOSAL | Status: AC
  Administered 2020-10-30: 15 mL via OROMUCOSAL
  Filled 2020-10-30: qty 15

## 2020-10-30 MED ORDER — SODIUM CHLORIDE FLUSH 0.9 % IV SOLN
INTRAVENOUS | Status: AC
  Filled 2020-10-30: qty 10

## 2020-10-30 MED ORDER — ROCURONIUM BROMIDE 10 MG/ML (PF) SYRINGE
PREFILLED_SYRINGE | INTRAVENOUS | Status: AC
  Filled 2020-10-30: qty 10

## 2020-10-30 MED ORDER — CHLORHEXIDINE GLUCONATE 0.12 % MT SOLN: 15.0000 mL | Freq: Once | OROMUCOSAL | Status: AC

## 2020-10-30 MED ORDER — EPHEDRINE SULFATE 50 MG/ML IJ SOLN
INTRAMUSCULAR | Status: DC | PRN
  Administered 2020-10-30: 10 mg via INTRAVENOUS
  Administered 2020-10-30: 5 mg via INTRAVENOUS

## 2020-10-30 MED ORDER — EPHEDRINE 5 MG/ML INJ
INTRAVENOUS | Status: AC
  Filled 2020-10-30: qty 10

## 2020-10-30 MED ORDER — ONDANSETRON HCL 4 MG/2ML IJ SOLN: 4.0000 mg | Freq: Once | INTRAMUSCULAR | Status: DC | PRN

## 2020-10-30 MED ORDER — FAMOTIDINE 20 MG PO TABS: 20.0000 mg | ORAL_TABLET | Freq: Once | ORAL | Status: AC

## 2020-10-30 MED ORDER — ORAL CARE MOUTH RINSE: 15.0000 mL | Freq: Once | OROMUCOSAL | Status: AC

## 2020-10-30 MED ORDER — 0.9 % SODIUM CHLORIDE (POUR BTL) OPTIME
TOPICAL | Status: DC | PRN
  Administered 2020-10-30: 250 mL

## 2020-10-30 MED ORDER — LABETALOL HCL 5 MG/ML IV SOLN
INTRAVENOUS | Status: AC
  Administered 2020-10-30: 5 mg via INTRAVENOUS
  Filled 2020-10-30: qty 4

## 2020-10-30 MED ORDER — OXYCODONE HCL 5 MG/5ML PO SOLN: 5.0000 mg | Freq: Once | ORAL | Status: AC | PRN

## 2020-10-30 MED ORDER — LIDOCAINE HCL (CARDIAC) PF 100 MG/5ML IV SOSY
PREFILLED_SYRINGE | INTRAVENOUS | Status: DC | PRN
  Administered 2020-10-30: 100 mg via INTRAVENOUS

## 2020-10-30 MED ORDER — FENTANYL CITRATE (PF) 100 MCG/2ML IJ SOLN
INTRAMUSCULAR | Status: AC
  Administered 2020-10-30: 25 ug via INTRAVENOUS
  Filled 2020-10-30: qty 2

## 2020-10-30 MED ORDER — LIDOCAINE HCL (PF) 2 % IJ SOLN
INTRAMUSCULAR | Status: AC
  Filled 2020-10-30: qty 5

## 2020-10-30 MED ORDER — PHENYLEPHRINE HCL (PRESSORS) 10 MG/ML IV SOLN
INTRAVENOUS | Status: AC
  Filled 2020-10-30: qty 1

## 2020-10-30 MED ORDER — ONDANSETRON HCL 4 MG/2ML IJ SOLN
INTRAMUSCULAR | Status: AC
  Filled 2020-10-30: qty 2

## 2020-10-30 MED ORDER — DEXAMETHASONE SODIUM PHOSPHATE 10 MG/ML IJ SOLN
INTRAMUSCULAR | Status: DC | PRN
  Administered 2020-10-30: 10 mg via INTRAVENOUS

## 2020-10-30 MED ORDER — CEFAZOLIN SODIUM-DEXTROSE 2-4 GM/100ML-% IV SOLN
2.0000 g | INTRAVENOUS | Status: AC
  Administered 2020-10-30: 2 g via INTRAVENOUS

## 2020-10-30 MED ORDER — OXYCODONE HCL 5 MG PO TABS
ORAL_TABLET | ORAL | Status: AC
  Administered 2020-10-30: 5 mg via ORAL
  Filled 2020-10-30: qty 1

## 2020-10-30 MED ORDER — DEXAMETHASONE SODIUM PHOSPHATE 10 MG/ML IJ SOLN
INTRAMUSCULAR | Status: AC
  Filled 2020-10-30: qty 1

## 2020-10-30 MED ORDER — BUPIVACAINE-EPINEPHRINE (PF) 0.25% -1:200000 IJ SOLN
INTRAMUSCULAR | Status: AC
  Filled 2020-10-30: qty 30

## 2020-10-30 MED ORDER — CEFAZOLIN SODIUM-DEXTROSE 2-4 GM/100ML-% IV SOLN
INTRAVENOUS | Status: AC
  Filled 2020-10-30: qty 100

## 2020-10-30 MED ORDER — SEVOFLURANE IN SOLN
RESPIRATORY_TRACT | Status: AC
  Filled 2020-10-30: qty 250

## 2020-10-30 MED ORDER — LABETALOL HCL 5 MG/ML IV SOLN
5.0000 mg | INTRAVENOUS | Status: AC | PRN
  Administered 2020-10-30: 5 mg via INTRAVENOUS

## 2020-10-30 MED ORDER — GABAPENTIN 300 MG PO CAPS
ORAL_CAPSULE | ORAL | Status: AC
  Administered 2020-10-30: 300 mg via ORAL
  Filled 2020-10-30: qty 1

## 2020-10-30 MED ORDER — GABAPENTIN 300 MG PO CAPS: 300.0000 mg | ORAL_CAPSULE | ORAL | Status: AC

## 2020-10-30 MED ORDER — FAMOTIDINE 20 MG PO TABS
ORAL_TABLET | ORAL | Status: AC
  Administered 2020-10-30: 20 mg via ORAL
  Filled 2020-10-30: qty 1

## 2020-10-30 MED ORDER — BUPIVACAINE LIPOSOME 1.3 % IJ SUSP
INTRAMUSCULAR | Status: AC
  Filled 2020-10-30: qty 20

## 2020-10-30 SURGICAL SUPPLY — 52 items
ADH SKN CLS APL DERMABOND .7 (GAUZE/BANDAGES/DRESSINGS) ×1
APL PRP STRL LF DISP 70% ISPRP (MISCELLANEOUS) ×1
BLADE CLIPPER SURG (BLADE) ×2 IMPLANT
CANISTER SUCT 1200ML W/VALVE (MISCELLANEOUS) ×2 IMPLANT
CANNULA CAP OBTURATR AIRSEAL 8 (CAP) ×2 IMPLANT
CHLORAPREP W/TINT 26 (MISCELLANEOUS) ×2 IMPLANT
COVER TIP SHEARS 8 DVNC (MISCELLANEOUS) ×1 IMPLANT
COVER TIP SHEARS 8MM DA VINCI (MISCELLANEOUS) ×1
COVER WAND RF STERILE (DRAPES) ×2 IMPLANT
DEFOGGER SCOPE WARMER CLEARIFY (MISCELLANEOUS) ×2 IMPLANT
DERMABOND ADVANCED (GAUZE/BANDAGES/DRESSINGS) ×1
DERMABOND ADVANCED .7 DNX12 (GAUZE/BANDAGES/DRESSINGS) ×1 IMPLANT
DRAPE 3/4 80X56 (DRAPES) ×2 IMPLANT
DRAPE ARM DVNC X/XI (DISPOSABLE) ×3 IMPLANT
DRAPE COLUMN DVNC XI (DISPOSABLE) ×1 IMPLANT
DRAPE DA VINCI XI ARM (DISPOSABLE) ×3
DRAPE DA VINCI XI COLUMN (DISPOSABLE) ×1
ELECT REM PT RETURN 9FT ADLT (ELECTROSURGICAL) ×2
ELECTRODE REM PT RTRN 9FT ADLT (ELECTROSURGICAL) ×1 IMPLANT
GAUZE 4X4 16PLY ~~LOC~~+RFID DBL (SPONGE) ×2 IMPLANT
GLOVE SURG ORTHO LTX SZ7.5 (GLOVE) ×6 IMPLANT
GOWN STRL REUS W/ TWL LRG LVL3 (GOWN DISPOSABLE) ×3 IMPLANT
GOWN STRL REUS W/TWL LRG LVL3 (GOWN DISPOSABLE) ×6
GRASPER SUT TROCAR 14GX15 (MISCELLANEOUS) IMPLANT
IRRIGATION STRYKERFLOW (MISCELLANEOUS) IMPLANT
IRRIGATOR STRYKERFLOW (MISCELLANEOUS)
IV CATH ANGIO 14GX1.88 NO SAFE (IV SOLUTION) ×2 IMPLANT
IV NS 1000ML (IV SOLUTION)
IV NS 1000ML BAXH (IV SOLUTION) IMPLANT
KIT PINK PAD W/HEAD ARE REST (MISCELLANEOUS) ×2
KIT PINK PAD W/HEAD ARM REST (MISCELLANEOUS) ×1 IMPLANT
LABEL OR SOLS (LABEL) ×2 IMPLANT
MANIFOLD NEPTUNE II (INSTRUMENTS) ×2 IMPLANT
MESH 3DMAX LIGHT 4.8X6.7 LT XL (Mesh General) ×1 IMPLANT
NDL INSUFFLATION 14GA 120MM (NEEDLE) IMPLANT
NEEDLE HYPO 22GX1.5 SAFETY (NEEDLE) ×2 IMPLANT
NEEDLE INSUFFLATION 14GA 120MM (NEEDLE) ×2 IMPLANT
PACK LAP CHOLECYSTECTOMY (MISCELLANEOUS) ×2 IMPLANT
SEAL CANN UNIV 5-8 DVNC XI (MISCELLANEOUS) ×2 IMPLANT
SEAL XI 5MM-8MM UNIVERSAL (MISCELLANEOUS) ×2
SET TUBE FILTERED XL AIRSEAL (SET/KITS/TRAYS/PACK) ×2 IMPLANT
SOLUTION ELECTROLUBE (MISCELLANEOUS) ×2 IMPLANT
SUT ETHIBOND 0 (SUTURE) ×1 IMPLANT
SUT MNCRL 4-0 (SUTURE) ×2
SUT MNCRL 4-0 27XMFL (SUTURE) ×1
SUT V-LOC 90 ABS DVC 3-0 CL (SUTURE) IMPLANT
SUT VIC AB 0 CT2 27 (SUTURE) ×2 IMPLANT
SUT VIC AB 2-0 SH 27 (SUTURE) ×2
SUT VIC AB 2-0 SH 27XBRD (SUTURE) IMPLANT
SUT VLOC 90 S/L VL9 GS22 (SUTURE) ×2 IMPLANT
SUTURE MNCRL 4-0 27XMF (SUTURE) ×1 IMPLANT
TROCAR Z-THREAD FIOS 11X100 BL (TROCAR) IMPLANT

## 2020-10-30 NOTE — Anesthesia Preprocedure Evaluation (Signed)
Anesthesia Evaluation  Patient identified by MRN, date of birth, ID band Patient awake    Reviewed: Allergy & Precautions, NPO status , Patient's Chart, lab work & pertinent test results  History of Anesthesia Complications Negative for: history of anesthetic complications  Airway Mallampati: III  TM Distance: >3 FB Neck ROM: Full    Dental no notable dental hx. (+) Teeth Intact   Pulmonary neg pulmonary ROS, neg sleep apnea, neg COPD, Patient abstained from smoking.Not current smoker,    Pulmonary exam normal breath sounds clear to auscultation       Cardiovascular Exercise Tolerance: Good METShypertension, + CAD, + Past MI, + CABG and + Peripheral Vascular Disease  (-) dysrhythmias  Rhythm:Regular Rate:Normal - Systolic murmurs Hx CABG 5465, post op afib s/p cardioversion.  Recent echo normal.   Neuro/Psych negative neurological ROS  negative psych ROS   GI/Hepatic neg GERD  ,(+)     (-) substance abuse  ,   Endo/Other  neg diabetes  Renal/GU CRFRenal disease     Musculoskeletal   Abdominal   Peds  Hematology   Anesthesia Other Findings Past Medical History: No date: Actinic keratosis 11/02/2019: Basal cell carcinoma     Comment:  upper back spinal  No date: Carotid artery stenosis No date: CKD (chronic kidney disease), stage III (HCC) No date: Coronary artery disease No date: Gout 04/05/2018: Hx of CABG     Comment:  2v; LIMA-LAD, SVG-PL No date: Hyperlipidemia No date: Hypertension No date: Left inguinal hernia 12/25/2013: NSTEMI (non-ST elevated myocardial infarction) ([place]) 03/30/2018: NSTEMI (non-ST elevated myocardial infarction) ([place]) No date: Peripheral vascular disease ([place]) No date: Postoperative atrial fibrillation ([place]) 1995: Prostate cancer ([place]) No date: Right renal artery stenosis (HCC)  Reproductive/Obstetrics                             Anesthesia  Physical Anesthesia Plan  ASA: 3  Anesthesia Plan: General   Post-op Pain Management:    Induction: Intravenous  PONV Risk Score and Plan: 3 and Ondansetron, Dexamethasone and Treatment may vary due to age or medical condition  Airway Management Planned: Oral ETT  Additional Equipment: None  Intra-op Plan:   Post-operative Plan: Extubation in OR  Informed Consent: I have reviewed the patients History and Physical, chart, labs and discussed the procedure including the risks, benefits and alternatives for the proposed anesthesia with the patient or authorized representative who has indicated his/her understanding and acceptance.     Dental advisory given  Plan Discussed with: CRNA and Surgeon  Anesthesia Plan Comments: (Discussed risks of anesthesia with patient, including PONV, sore throat, lip/dental damage, Post operative cognitive dysfunction . Rare risks discussed as well, such as cardiorespiratory and neurological sequelae. Patient understands.)        Anesthesia Quick Evaluation

## 2020-10-30 NOTE — Interval H&P Note (Signed)
History and Physical Interval Note:  10/30/2020 7:23 AM  Timothy Goodwin  has presented today for surgery, with the diagnosis of left inguinal hernia.  The various methods of treatment have been discussed with the patient and family. After consideration of risks, benefits and other options for treatment, the patient has consented to  Procedure(s): XI ROBOTIC [place] (Left) as a surgical intervention.  The patient's history has been reviewed, patient examined, no change in status, stable for surgery.  I have reviewed the patient's chart and labs.  Questions were answered to the patient's satisfaction.    The left side is marked, and will utilize the umbilical fascial defect for a port site and repair the defect on closure.   Ronny Bacon

## 2020-10-30 NOTE — Transfer of Care (Signed)
Immediate Anesthesia Transfer of Care Note  Patient: Keagon Glascoe  Procedure(s) Performed: XI ROBOTIC ASSISTED INGUINAL HERNIA REPAIR WITH MESH (Left)  Patient Location: PACU  Anesthesia Type:General  Level of Consciousness: responds to stimulation  Airway & Oxygen Therapy: Patient Spontanous Breathing and Patient connected to face mask oxygen  Post-op Assessment: Report given to RN and Post -op Vital signs reviewed and stable  Post vital signs: stable  Last Vitals:  Vitals Value Taken Time  BP 203/95 10/30/20 0941  Temp    Pulse 76 10/30/20 0943  Resp 17 10/30/20 0943  SpO2 98 % 10/30/20 0943  Vitals shown include unvalidated device data.  Last Pain:  Vitals:   10/30/20 0618  TempSrc: Temporal  PainSc: 0-No pain         Complications: No notable events documented.

## 2020-10-30 NOTE — Op Note (Signed)
Robotic assisted Laparoscopic Transabdominal Left Inguinal Hernia Repair with Mesh Umbilical hernia repair.      Pre-operative Diagnosis: Left inguinal Hernia   Post-operative Diagnosis: Same, large 6 cm indirect left inguinal hernia defect with 1.5 cm umbilical fascial defect. Sliding hernia with portion of sigmoid mesentery making up the posterior wall of the hernia sac.   Procedure: Robotic assisted Laparoscopic  repair of left inguinal hernia(s), open repair of umbilical fascial defect.   Surgeon: Ronny Bacon, M.D., FACS   Anesthesia: GETA   Findings: Left large indirect sliding inguinal hernia, no evidence of right sided hernia.        Procedure Details  The patient was seen again in the Holding Room. The benefits, complications, treatment options, and expected outcomes were discussed with the patient. The risks of bleeding, infection, recurrence of symptoms, failure to resolve symptoms, recurrence of hernia, ischemic orchitis, chronic pain syndrome or neuroma, were reviewed again. The likelihood of improving the patient's symptoms with return to their baseline status is good.  The patient and/or family concurred with the proposed plan, giving informed consent.  The patient was taken to Operating Room, identified  and the procedure verified as Laparoscopic Inguinal Hernia Repair. Laterality confirmed.  A Time Out was held and the above information confirmed.   Prior to the induction of general anesthesia, antibiotic prophylaxis was administered. VTE prophylaxis was in place. General endotracheal anesthesia was then administered and tolerated well. After the induction, the abdomen was prepped with Chloraprep and draped in the sterile fashion. The patient was positioned in the supine position.   After local infiltration of quarter percent Marcaine with epinephrine, and incision was made at the umbilicus.  Sharp dissection completed opening of the umbilical hernia sac, and utilizing an  optical 11 mm trocar we passed the trocar into the peritoneal cavity under direct visualization.   Insufflation is initiated with carbon dioxide to pressures of 15 mmHg. An 8.5 mm port is placed to the left, with blunt tipped trocar.  Pneumoperitoneum maintained w/o HD changes. No evidence of bowel injuries.  Another 8.5 mm port is placed under direct vision on the right. The laparoscopy revealed large left indirect defect(s).   The robot was brought ot the table and docked in the standard fashion, no collision between arms was observed. Instruments were kept under direct view at all times. For left inguinal hernia repair,  I developed a peritoneal flap. The sac(s) were reduced and dissected free from adjacent structures. We preserved the vas and the vessels, and visualized them to their convergence and beyond in the retroperitoneum.   Once dissection was completed an extralarge left sided BARD 3D Light mesh was placed and secured at four points with interrupted 0 Vicryl to the pubic tubercle and anteriorly. And to the inferior aspect of the defect.      There was good coverage of the direct, indirect and femoral spaces.    Second look revealed no complications or injuries. The flap was then closed with 2-0 V-lock suture.  Peritoneal closure completed using a 2-0 Vicryl, as I had to transect the distal sac.  Once assuring that hemostasis was adequate, all needles/sponges removed, and the robot was undocked.  The umbilical fascia was closed with 0 Ethibond using vest overpants midline suture, with 2 simple fascial sutures completing closure of the defect.  This was confirmed visually with reinsufflation. The ports were removed, the abdomen desulflated.  4-0 subcuticular Monocryl was used at all skin edges. Dermabond was placed.  Patient  tolerated the procedure well. There were no complications. He was taken to the recovery room in stable condition.           Ronny Bacon, M.D.,  FACS 10/30/2020, 9:39 AM

## 2020-10-30 NOTE — Anesthesia Procedure Notes (Signed)
Procedure Name: Intubation Date/Time: 10/30/2020 7:40 AM Performed by: Loni Dolly, CRNA Pre-anesthesia Checklist: Patient identified, Emergency Drugs available, Suction available and Patient being monitored Patient Re-evaluated:Patient Re-evaluated prior to induction Oxygen Delivery Method: Circle system utilized Preoxygenation: Pre-oxygenation with 100% oxygen Induction Type: IV induction Ventilation: Mask ventilation without difficulty Laryngoscope Size: McGraph and 4 Grade View: Grade I Tube type: Oral Tube size: 7.5 mm Number of attempts: 1 Airway Equipment and Method: Stylet and Oral airway Placement Confirmation: ETT inserted through vocal cords under direct vision, positive ETCO2 and breath sounds checked- equal and bilateral Secured at: 22 cm Tube secured with: Tape Dental Injury: Teeth and Oropharynx as per pre-operative assessment

## 2020-10-30 NOTE — Discharge Instructions (Signed)

## 2020-10-30 NOTE — Anesthesia Postprocedure Evaluation (Signed)
Anesthesia Post Note  Patient: Timothy Goodwin  Procedure(s) Performed: XI ROBOTIC ASSISTED INGUINAL HERNIA REPAIR WITH MESH (Left)  Patient location during evaluation: PACU Anesthesia Type: General Level of consciousness: awake and alert Pain management: pain level controlled Vital Signs Assessment: post-procedure vital signs reviewed and stable Respiratory status: spontaneous breathing, nonlabored ventilation, respiratory function stable and patient connected to nasal cannula oxygen Cardiovascular status: blood pressure returned to baseline and stable Postop Assessment: no apparent nausea or vomiting Anesthetic complications: no   No notable events documented.   Last Vitals:  Vitals:   10/30/20 1055 10/30/20 1116  BP: (!) 204/97 (!) 183/76  Pulse: (!) 57 69  Resp: 16 16  Temp: (!) 36.2 C   SpO2: 97% 96%    Last Pain:  Vitals:   10/30/20 1116  TempSrc:   PainSc: 3                  Arita Miss

## 2020-10-31 ENCOUNTER — Telehealth: Payer: Self-pay | Admitting: Surgery

## 2020-10-31 NOTE — Telephone Encounter (Signed)
Spoke with patient and he states the redness is mild. We discussed watching for bright redness, hot to the touch, fever.   Currently he denies fever, bright redness and no drainage. He will call if things change.

## 2020-10-31 NOTE — Telephone Encounter (Signed)
Patient is calling, patient just had surgery yesterday and is complaining of having redness around his incision sites. Please call patient and advise.

## 2020-11-07 ENCOUNTER — Ambulatory Visit (INDEPENDENT_AMBULATORY_CARE_PROVIDER_SITE_OTHER): Payer: Medicare PPO | Admitting: Surgery

## 2020-11-07 ENCOUNTER — Other Ambulatory Visit: Payer: Self-pay

## 2020-11-07 ENCOUNTER — Encounter: Payer: Self-pay | Admitting: Surgery

## 2020-11-07 VITALS — BP 147/78 | HR 65 | Temp 98.3°F | Ht 63.0 in | Wt 163.0 lb

## 2020-11-07 DIAGNOSIS — Z8719 Personal history of other diseases of the digestive system: Secondary | ICD-10-CM

## 2020-11-07 DIAGNOSIS — Z9889 Other specified postprocedural states: Secondary | ICD-10-CM

## 2020-11-07 NOTE — Progress Notes (Signed)
[facility] POST-OP OFFICE VISIT  11/07/2020  HPI: Timothy Goodwin is an 86 y.o. male 8 days s/p robotic left inguinal hernia repair.  He denies any significant pain, taking only Tylenol on his first day postop and nothing since.  Denies nausea, vomiting, fevers and chills.  He reports normal twice daily bowel movements and is voiding without difficulty.  We reviewed the skin changes with the slight degree of ecchymotic change surrounding the incisions.  Slight degree of ecchymosis in the left scrotum.  Vital signs: BP (!) 147/78   Pulse 65   Temp 98.3 F (36.8 C) (Oral)   Ht 5\' 3"  (1.6 m)   Wt 163 lb (73.9 kg)   SpO2 97%   BMI 28.87 kg/m    Physical Exam: Constitutional: He looks great. Abdomen: Expected subcutaneous ecchymotic changes without evidence of hematoma or fascial defect.  No mass-effect whatsoever.  Umbilical area is well-healing.  Left groin and scrotum without mass, no change with Valsalva.  Assessment/Plan: This is a 86 y.o. male 8 days s/p left inguinal hernia repair, robotic assisted with placement of mesh.  Patient Active Problem List   Diagnosis Date Noted   Non-recurrent unilateral inguinal hernia without obstruction or gangrene 00/17/4944   Umbilical hernia without obstruction and without gangrene 03/28/2020   Hyperlipidemia 05/16/2018   Atrial fibrillation ([place]) 04/09/2018   ATN (acute tubular necrosis) ([place]) 04/07/2018   S/P CABG x 2 04/05/2018   3-vessel coronary artery disease 03/31/2018   NSTEMI (non-ST elevated myocardial infarction) ([place]) 03/29/2018   Essential hypertension 06/05/2016   Renal artery stenosis ([place]) 06/05/2016   CKD (chronic kidney disease) stage 3, GFR 30-59 ml/min ([place]) 11/12/2014   Cancer of prostate ([place]) 05/14/2014    -He is doing so well today there is not a whole lot a reason to bring him back in for routine follow-up, I strongly advised that he let us know should anything change.  He should continue his weight  lifting restrictions primarily out of concern for his umbilical fascial defect.  We will keep our doors open and be readily available to see him should anything arise.     Ronny Bacon M.D., FACS 11/07/2020, 9:17 AM

## 2020-11-07 NOTE — Patient Instructions (Signed)
If you have any concerns or questions, please feel free to call our office. Follow up as needed.  GENERAL POST-OPERATIVE PATIENT INSTRUCTIONS   WOUND CARE INSTRUCTIONS:  Keep a dry clean dressing on the wound if there is drainage. The initial bandage may be removed after 24 hours.  Once the wound has quit draining you may leave it open to air.  If clothing rubs against the wound or causes irritation and the wound is not draining you may cover it with a dry dressing during the daytime.  Try to keep the wound dry and avoid ointments on the wound unless directed to do so.  If the wound becomes bright red and painful or starts to drain infected material that is not clear, please contact your physician immediately.  If the wound is mildly pink and has a thick firm ridge underneath it, this is normal, and is referred to as a healing ridge.  This will resolve over the next 4-6 weeks.  BATHING: You may shower if you have been informed of this by your surgeon. However, Please do not submerge in a tub, hot tub, or pool until incisions are completely sealed or have been told by your surgeon that you may do so.  DIET:  You may eat any foods that you can tolerate.  It is a good idea to eat a high fiber diet and take in plenty of fluids to prevent constipation.  If you do become constipated you may want to take a mild laxative or take ducolax tablets on a daily basis until your bowel habits are regular.  Constipation can be very uncomfortable, along with straining, after recent surgery.  ACTIVITY:  You are encouraged to cough and deep breath or use your incentive spirometer if you were given one, every 15-30 minutes when awake.  This will help prevent respiratory complications and low grade fevers post-operatively if you had a general anesthetic.  You may want to hug a pillow when coughing and sneezing to add additional support to the surgical area, if you had abdominal or chest surgery, which will decrease pain  during these times.  You are encouraged to walk and engage in light activity for the next two weeks.  You should not lift more than 15 pounds, until 12/11/2020 as it could put you at increased risk for complications.  Twenty pounds is roughly equivalent to a plastic bag of groceries. At that time- Listen to your body when lifting, if you have pain when lifting, stop and then try again in a few days. Soreness after doing exercises or activities of daily living is normal as you get back in to your normal routine.  MEDICATIONS:  Try to take narcotic medications and anti-inflammatory medications, such as tylenol, ibuprofen, naprosyn, etc., with food.  This will minimize stomach upset from the medication.  Should you develop nausea and vomiting from the pain medication, or develop a rash, please discontinue the medication and contact your physician.  You should not drive, make important decisions, or operate machinery when taking narcotic pain medication.  SUNBLOCK Use sun block to incision area over the next year if this area will be exposed to sun. This helps decrease scarring and will allow you avoid a permanent darkened area over your incision.

## 2021-01-22 ENCOUNTER — Ambulatory Visit (INDEPENDENT_AMBULATORY_CARE_PROVIDER_SITE_OTHER): Payer: Medicare PPO | Admitting: Dermatology

## 2021-01-22 ENCOUNTER — Other Ambulatory Visit: Payer: Self-pay

## 2021-01-22 ENCOUNTER — Encounter: Payer: Self-pay | Admitting: Dermatology

## 2021-01-22 DIAGNOSIS — L578 Other skin changes due to chronic exposure to nonionizing radiation: Secondary | ICD-10-CM | POA: Diagnosis not present

## 2021-01-22 DIAGNOSIS — L570 Actinic keratosis: Secondary | ICD-10-CM | POA: Diagnosis not present

## 2021-01-22 DIAGNOSIS — L820 Inflamed seborrheic keratosis: Secondary | ICD-10-CM | POA: Diagnosis not present

## 2021-01-22 DIAGNOSIS — L720 Epidermal cyst: Secondary | ICD-10-CM | POA: Diagnosis not present

## 2021-01-22 MED ORDER — FLUOROURACIL 5 % EX CREA: TOPICAL_CREAM | CUTANEOUS | 0 refills | Status: DC

## 2021-01-22 MED ORDER — CALCIPOTRIENE 0.005 % EX SOLN: CUTANEOUS | 0 refills | Status: DC

## 2021-01-22 NOTE — Progress Notes (Signed)
Follow-Up Visit   Subjective  Timothy Goodwin is a 86 y.o. male who presents for the following: Actinic Keratosis (Of the face and scalp - S/P 5FU/Calcipotriene mix).  The following portions of the chart were reviewed this encounter and updated as appropriate:   Tobacco  Allergies  Meds  Problems  Med Hx  Surg Hx  Fam Hx     Review of Systems:  No other skin or systemic complaints except as noted in HPI or Assessment and Plan.  Objective  Well appearing patient in no apparent distress; mood and affect are within normal limits.  A focused examination was performed including the face and scalp. Relevant physical exam findings are noted in the Assessment and Plan.  Scalp and face x 25 (25) Erythematous thin papules/macules with gritty scale.   R forehead 0.7 cm firm SQ nodule.  Scalp x 3 Erythematous keratotic or waxy stuck-on papule or plaque.    Assessment & Plan  AK (actinic keratosis) (25) Scalp and face x 25 Destruction of lesion - Scalp and face x 25 Complexity: simple   Destruction method: cryotherapy   Informed consent: discussed and consent obtained   Timeout:  patient name, date of birth, surgical site, and procedure verified Lesion destroyed using liquid nitrogen: Yes   Region frozen until ice ball extended beyond lesion: Yes   Outcome: patient tolerated procedure well with no complications   Post-procedure details: wound care instructions given    Related Medications fluorouracil (EFUDEX) 5 % cream Apply twice daily to the forehead and scalp after applying Calcipotriene solution. Use for one week.  Calcipotriene 0.005 % solution Apply to the forehead and scalp twice daily for one week.  Epidermal inclusion cyst R forehead Benign-appearing. Exam most consistent with an epidermal inclusion cyst. Discussed that a cyst is a benign growth that can grow over time and sometimes get irritated or inflamed. Recommend observation if it is not bothersome.  Discussed option of surgical excision to remove it if it is growing, symptomatic, or other changes noted. Please call for new or changing lesions so they can be evaluated.  Inflamed seborrheic keratosis Scalp x 3 Destruction of lesion - Scalp x 3 Complexity: simple   Destruction method: cryotherapy   Informed consent: discussed and consent obtained   Timeout:  patient name, date of birth, surgical site, and procedure verified Lesion destroyed using liquid nitrogen: Yes   Region frozen until ice ball extended beyond lesion: Yes   Outcome: patient tolerated procedure well with no complications   Post-procedure details: wound care instructions given    Actinic Damage - Severe, confluent actinic changes with pre-cancerous actinic keratoses  - Severe, chronic, not at goal, secondary to cumulative UV radiation exposure over time - diffuse scaly erythematous macules and papules with underlying dyspigmentation - Discussed Prescription "Field Treatment" for Severe, Chronic Confluent Actinic Changes with Pre-Cancerous Actinic Keratoses Field treatment involves treatment of an entire area of skin that has confluent Actinic Changes (Sun/ Ultraviolet light damage) and PreCancerous Actinic Keratoses by method of PhotoDynamic Therapy (PDT) and/or prescription Topical Chemotherapy agents such as 5-fluorouracil, 5-fluorouracil/calcipotriene, and/or imiquimod.  The purpose is to decrease the number of clinically evident and subclinical PreCancerous lesions to prevent progression to development of skin cancer by chemically destroying early precancer changes that may or may not be visible.  It has been shown to reduce the risk of developing skin cancer in the treated area. As a result of treatment, redness, scaling, crusting, and open sores may occur during  treatment course. One or more than one of these methods may be used and may have to be used several times to control, suppress and eliminate the PreCancerous  changes. Discussed treatment course, expected reaction, and possible side effects. - Recommend daily broad spectrum sunscreen SPF 30+ to sun-exposed areas, reapply every 2 hours as needed.  - Staying in the shade or wearing long sleeves, sun glasses (UVA+UVB protection) and wide brim hats (4-inch brim around the entire circumference of the hat) are also recommended. - Call for new or changing lesions. - In 4 weeks restart 5FU/Calcipotriene cream BID x 7 days to the forehead/temples/scalp.  Return in about 6 months (around 07/23/2021) for AK follow up .  Luther Redo, CMA, am acting as scribe for Sarina Ser, MD . Documentation: I have reviewed the above documentation for accuracy and completeness, and I agree with the above.  Sarina Ser, MD

## 2021-01-22 NOTE — Patient Instructions (Signed)

## 2021-02-20 ENCOUNTER — Other Ambulatory Visit (HOSPITAL_COMMUNITY): Payer: Self-pay | Admitting: General Surgery

## 2021-02-20 ENCOUNTER — Other Ambulatory Visit: Payer: Self-pay | Admitting: General Surgery

## 2021-02-20 DIAGNOSIS — N50812 Left testicular pain: Secondary | ICD-10-CM

## 2021-02-28 ENCOUNTER — Other Ambulatory Visit: Payer: Self-pay

## 2021-02-28 ENCOUNTER — Ambulatory Visit
Admission: RE | Admit: 2021-02-28 | Discharge: 2021-02-28 | Disposition: A | Discharge status: Home / Self Care | Payer: Medicare PPO | Source: Ambulatory Visit | Attending: General Surgery | Admitting: General Surgery

## 2021-02-28 DIAGNOSIS — N50812 Left testicular pain: Secondary | ICD-10-CM | POA: Insufficient documentation

## 2021-03-18 NOTE — Progress Notes (Signed)
03/19/21 9:04 AM   Timothy Goodwin 05/08/1934 [id number]  Referring provider:  Herbert Pun, MD [address] [place],  [place] [address] Chief Complaint  Patient presents with   Hydrocele     HPI: Timothy Goodwin is a 86 y.o.male with a personal history of prostate cancer and stage III CKD who presents today for further evaluation of hydrocele.  He is s/p left inguinal hernia repair on July 2022. Post-operatively he developed bruising and swelling.   Scrotal ultrasound to further evaluate testicular pain on 02/28/2021 revealed large complex fluid collection within the upper left scrotum superiorly measuring 8.4 x 5.8 x 5.8 cm, consistent with a complex hydrocele.  He reports today that he has had this area checked by his PCP, Dr.Hande and another physician Dr. Tish Frederickson.   He first noticed the firmness in his left inguinal area about a month postop.  Its remained stable since.  Does not really bother him or cause any pain.  He reports the he had prostatectomy in 1995, he had his entire prostate removed. His most recent PSA on 02/04/2021 was 1.01.  He reports that it remained undetectable for 10 years and has been fluctuating since then.  1 is the highest that its been.  He saw urologist at [facility] who recommended observation for this but he has not seen a urologist in many years.   PMH: Past Medical History:  Diagnosis Date   Actinic keratosis    Basal cell carcinoma 11/02/2019   upper back spinal    Carotid artery stenosis    CKD (chronic kidney disease), stage III (HCC)    Coronary artery disease    Gout    Hx of CABG 04/05/2018   2v; LIMA-LAD, SVG-PL   Hyperlipidemia    Hypertension    Left inguinal hernia    NSTEMI (non-ST elevated myocardial infarction) ([place]) 12/25/2013   NSTEMI (non-ST elevated myocardial infarction) ([place]) 03/30/2018   Peripheral vascular disease (HCC)    Postoperative atrial fibrillation ([place])    Prostate cancer ([place]) 1995    Right renal artery stenosis [facility])     Surgical History: Past Surgical History:  Procedure Laterality Date   CARDIAC CATHETERIZATION N/A 12/27/2013   Location: [facility]; Surgeon: Katrine Coho, MD   CORONARY ANGIOPLASTY WITH STENT PLACEMENT N/A 2000   OM1 stents (unknown type) placed; Location: [facility] GRAFT N/A 04/05/2018   2v; LIMA-LAD, SVG-PL; Location: [facility]; Surgeon: Domenick Bookbinder, MD   LEFT HEART CATH AND CORONARY ANGIOGRAPHY N/A 03/30/2018   Procedure: LEFT HEART CATH AND CORONARY ANGIOGRAPHY and possible PCI and stent;  Surgeon: Yolonda Kida, MD;  Location: [facility];  Service: Cardiovascular;  Laterality: N/A;   PROSTATECTOMY  1995   SMALL BOWEL ENTEROSCOPY  2019   TEE WITH CARDIOVERSION N/A 04/10/2018   Required 3 cardioversions/shocks to briefly restore NSR; A.fib refractory; Location: [facility]   TONSILLECTOMY     XI ROBOTIC ASSISTED INGUINAL HERNIA REPAIR WITH MESH Left 10/30/2020   Procedure: XI ROBOTIC ASSISTED INGUINAL HERNIA REPAIR WITH MESH;  Surgeon: Ronny Bacon, MD;  Location: [facility] ORS;  Service: General;  Laterality: Left;    Home Medications:  Allergies as of 03/19/2021       Reactions   Aspirin Shortness Of Breath   Lisinopril Shortness Of Breath   Ace Inhibitors Other (See Comments)   Nsaids Swelling   Sulfa Antibiotics Swelling, Rash        Medication List  Accurate as of March 19, 2021  9:04 AM. If you have any questions, ask your nurse or doctor.          STOP taking these medications    Calcipotriene 0.005 % solution Stopped by: Hollice Espy, MD   fluorouracil 5 % cream Commonly known as: EFUDEX Stopped by: Hollice Espy, MD       TAKE these medications    allopurinol 100 MG tablet Commonly known as: ZYLOPRIM Take 100 mg by mouth daily.   amLODipine 2.5 MG tablet Commonly known as: NORVASC Take 2.5 mg by mouth in the morning and at bedtime.   atorvastatin 10 MG  tablet Commonly known as: LIPITOR Take 10 mg by mouth daily.   Calcium 200 MG Tabs Take 200 mg by mouth daily.   cloNIDine 0.1 MG tablet Commonly known as: CATAPRES Take 0.1 mg by mouth 2 (two) times daily.   clopidogrel 75 MG tablet Commonly known as: PLAVIX Take 75 mg by mouth daily.   Coenzyme Q10 200 MG capsule Take 200 mg by mouth 3 (three) times a week.   metoprolol tartrate 25 MG tablet Commonly known as: LOPRESSOR Take 25 mg by mouth 2 (two) times daily.   multivitamin capsule Take 1 capsule by mouth daily.   Turmeric 450 MG Caps Take 450 mg by mouth 3 (three) times a week.   vitamin C 1000 MG tablet Take 1,000 mg by mouth 3 (three) times a week.   vitamin E 200 UNIT capsule Take 200 Units by mouth daily.        Allergies:  Allergies  Allergen Reactions   Aspirin Shortness Of Breath   Lisinopril Shortness Of Breath   Ace Inhibitors Other (See Comments)   Nsaids Swelling   Sulfa Antibiotics Swelling and Rash    Family History: Family History  Problem Relation Age of Onset   Prostate cancer Father     Social History:  reports that he has never smoked. He has never used smokeless tobacco. He reports that he does not drink alcohol and does not use drugs.   Physical Exam: BP (!) 157/80   Pulse 69   Ht 5\' 5"  (1.651 m)   Wt 158 lb (71.7 kg)   BMI 26.29 kg/m   Constitutional:  Alert and oriented, No acute distress. HEENT: [place] AT, moist mucus membranes.  Trachea midline, no masses. Cardiovascular: No clubbing, cyanosis, or edema. Respiratory: Normal respiratory effort, no increased work of breathing. GU: No CVA tenderness, 10 cm area in left inguinal canal which was well circumscribed and firm but did not involve dependent area of scrotum, left testicle normal and palpable. Skin: No rashes, bruises or suspicious lesions. Neurologic: Grossly intact, no focal deficits, moving all 4 extremities. Psychiatric: Normal mood and affect.  Laboratory  Data:  Lab Results  Component Value Date   CREATININE 1.92 (H) 10/24/2020   UA today is negative  Pertinent Imaging: CLINICAL DATA:  Testicular pain, left   EXAM: SCROTAL ULTRASOUND   DOPPLER ULTRASOUND OF THE TESTICLES   TECHNIQUE: Complete ultrasound examination of the testicles, epididymis, and other scrotal structures was performed. Color and spectral Doppler ultrasound were also utilized to evaluate blood flow to the testicles.   COMPARISON:  Scrotal ultrasound 10/15/2020   FINDINGS: Right testicle   Measurements: 4.1 x 2.5 x 3.0 cm. No mass or microlithiasis visualized.   Left testicle   Measurements: 4.2 x 2.0 x 2.7 cm. No mass or microlithiasis visualized.   Right epididymis: Multiple epididymal cysts,  largest in the tail measuring up to 1.2 cm.   Left epididymis: Multiple epididymal cysts, largest appears septated and measures up to 1.9 cm.   Hydrocele: There is a large complex fluid collection within the left scrotum superiorly measuring 8.4 x 5.8 x 5.8 cm. Trace right hydrocele.   Varicocele:  None visualized.   Pulsed Doppler interrogation of both testes demonstrates normal low resistance arterial and venous waveforms bilaterally.   IMPRESSION: Large complex fluid collection within the upper left scrotum superiorly measuring 8.4 x 5.8 x 5.8 cm, consistent with a complex hydrocele.   Normal testicles.  Bilateral epididymal cysts as seen on prior exam.     Electronically Signed   By: Maurine Simmering M.D.   On: 03/02/2021 21:04   Scrotal ultrasound images were personally reviewed, agree with radiologic interpretation, multiloculated cystic structure involving the left cord.  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 Given he is asymptomatic he is most interested in observation.   2. History of prostate cancer  - He is s/p prostatectomy in 1995 - Recent PSA has elevated concerning for reoccurrence. Initially it was has not been detectable but recent PSA is elevated to 1.01 has been as high as 0.7. He has not had any intervention in the pat. Given age and stable elevated PSA will continue watchful waiting.  - Will continue to monitor this with PSA in 6 months   Return 6 months with [facility] [address], [place], [place] [address] [phone]  I have reviewed the above documentation for accuracy and completeness, and I agree with the above.   Hollice Espy, MD

## 2021-03-19 ENCOUNTER — Other Ambulatory Visit: Payer: Self-pay

## 2021-03-19 ENCOUNTER — Encounter: Payer: Self-pay | Admitting: Urology

## 2021-03-19 ENCOUNTER — Ambulatory Visit (INDEPENDENT_AMBULATORY_CARE_PROVIDER_SITE_OTHER): Payer: Medicare PPO | Admitting: Urology

## 2021-03-19 VITALS — BP 157/80 | HR 69 | Ht 65.0 in | Wt 158.0 lb

## 2021-03-19 DIAGNOSIS — Z8546 Personal history of malignant neoplasm of prostate: Secondary | ICD-10-CM

## 2021-03-19 DIAGNOSIS — N433 Hydrocele, unspecified: Secondary | ICD-10-CM | POA: Diagnosis not present

## 2021-03-19 LAB — URINALYSIS, COMPLETE
Bilirubin, UA: NEGATIVE
Glucose, UA: NEGATIVE
Ketones, UA: NEGATIVE
Leukocytes,UA: NEGATIVE
Nitrite, UA: NEGATIVE
Specific Gravity, UA: 1.025 (ref 1.005–1.030)
Urobilinogen, Ur: 0.2 mg/dL (ref 0.2–1.0)
pH, UA: 5 (ref 5.0–7.5)

## 2021-03-19 LAB — MICROSCOPIC EXAMINATION: Bacteria, UA: NONE SEEN

## 2021-07-30 ENCOUNTER — Ambulatory Visit: Payer: Medicare PPO | Admitting: Dermatology

## 2021-08-20 ENCOUNTER — Ambulatory Visit (INDEPENDENT_AMBULATORY_CARE_PROVIDER_SITE_OTHER): Payer: Medicare PPO | Admitting: Dermatology

## 2021-08-20 DIAGNOSIS — L578 Other skin changes due to chronic exposure to nonionizing radiation: Secondary | ICD-10-CM | POA: Diagnosis not present

## 2021-08-20 DIAGNOSIS — S01312A Laceration without foreign body of left ear, initial encounter: Secondary | ICD-10-CM

## 2021-08-20 DIAGNOSIS — L570 Actinic keratosis: Secondary | ICD-10-CM

## 2021-08-20 NOTE — Patient Instructions (Signed)

## 2021-08-20 NOTE — Progress Notes (Signed)
? ?  Follow-Up Visit ?  ?Subjective  ?Timothy Goodwin is a 87 y.o. male who presents for the following: Actinic Keratosis (6 month follow up of face and scalp - treated with LN2 x 25 and 5FU). ?The patient has spots, moles and lesions to be evaluated, some may be new or changing and the patient has concerns that these could be cancer. ? ?The following portions of the chart were reviewed this encounter and updated as appropriate:  ? Tobacco  Allergies  Meds  Problems  Med Hx  Surg Hx  Fam Hx   ?  ?Review of Systems:  No other skin or systemic complaints except as noted in HPI or Assessment and Plan. ? ?Objective  ?Well appearing patient in no apparent distress; mood and affect are within normal limits. ? ?A focused examination was performed including face, scalp, ears. Relevant physical exam findings are noted in the Assessment and Plan. ? ?Left Ear ?Laceration ? ?Scalp, face, ears (16) ?Erythematous thin papules/macules with gritty scale.  ? ? ?Assessment & Plan  ?Laceration of left ear, initial encounter ?Left Ear ?Area cleansed with Puracyn and mupirocin applied ? ?AK (actinic keratosis) (16) ?Scalp, face, ears ?Actinic Damage - Severe, confluent actinic changes with pre-cancerous actinic keratoses  ?- Severe, chronic, not at goal, secondary to cumulative UV radiation exposure over time ?- diffuse scaly erythematous macules and papules with underlying dyspigmentation ?- Discussed Prescription "Field Treatment" for Severe, Chronic Confluent Actinic Changes with Pre-Cancerous Actinic Keratoses ?Field treatment involves treatment of an entire area of skin that has confluent Actinic Changes (Sun/ Ultraviolet light damage) and PreCancerous Actinic Keratoses by method of PhotoDynamic Therapy (PDT) and/or prescription Topical Chemotherapy agents such as 5-fluorouracil, 5-fluorouracil/calcipotriene, and/or imiquimod.  The purpose is to decrease the number of clinically evident and subclinical PreCancerous lesions to  prevent progression to development of skin cancer by chemically destroying early precancer changes that may or may not be visible.  It has been shown to reduce the risk of developing skin cancer in the treated area. As a result of treatment, redness, scaling, crusting, and open sores may occur during treatment course. One or more than one of these methods may be used and may have to be used several times to control, suppress and eliminate the PreCancerous changes. Discussed treatment course, expected reaction, and possible side effects. ?- Recommend daily broad spectrum sunscreen SPF 30+ to sun-exposed areas, reapply every 2 hours as needed.  ?- Staying in the shade or wearing long sleeves, sun glasses (UVA+UVB protection) and wide brim hats (4-inch brim around the entire circumference of the hat) are also recommended. ?- Call for new or changing lesions. ? ?Restart Fluorouracil 5%/Calcipotriene cream bid x 1 week to forehead, temples and scalp ? ?Destruction of lesion - Scalp, face, ears ?Complexity: simple   ?Destruction method: cryotherapy   ?Informed consent: discussed and consent obtained   ?Timeout:  patient name, date of birth, surgical site, and procedure verified ?Lesion destroyed using liquid nitrogen: Yes   ?Region frozen until ice ball extended beyond lesion: Yes   ?Outcome: patient tolerated procedure well with no complications   ?Post-procedure details: wound care instructions given   ? ?Return for 6-8 months , AK follow up. ? ?I, Ashok Cordia, CMA, am acting as scribe for Sarina Ser, MD . ?Documentation: I have reviewed the above documentation for accuracy and completeness, and I agree with the above. ? ?Sarina Ser, MD ? ?

## 2021-09-02 ENCOUNTER — Encounter: Payer: Self-pay | Admitting: Dermatology

## 2021-09-16 ENCOUNTER — Other Ambulatory Visit: Payer: Medicare PPO

## 2021-09-24 ENCOUNTER — Ambulatory Visit: Payer: Medicare PPO | Admitting: Urology

## 2022-03-23 ENCOUNTER — Ambulatory Visit (INDEPENDENT_AMBULATORY_CARE_PROVIDER_SITE_OTHER): Payer: Medicare PPO | Admitting: Dermatology

## 2022-03-23 DIAGNOSIS — L578 Other skin changes due to chronic exposure to nonionizing radiation: Secondary | ICD-10-CM | POA: Diagnosis not present

## 2022-03-23 DIAGNOSIS — L570 Actinic keratosis: Secondary | ICD-10-CM | POA: Diagnosis not present

## 2022-03-23 DIAGNOSIS — Z79899 Other long term (current) drug therapy: Secondary | ICD-10-CM | POA: Diagnosis not present

## 2022-03-23 DIAGNOSIS — Z5111 Encounter for antineoplastic chemotherapy: Secondary | ICD-10-CM | POA: Diagnosis not present

## 2022-03-23 NOTE — Progress Notes (Signed)
Follow-Up Visit   Subjective  Timothy Goodwin is a 87 y.o. male who presents for the following: Actinic Keratosis (Of the face, ears, and scalp - pt currently using 5FU/Calcipotriene mix QD x 1 week, previously treated with LN2). The patient has spots, moles and lesions to be evaluated, some may be new or changing and the patient has concerns that these could be cancer.  The following portions of the chart were reviewed this encounter and updated as appropriate:   Tobacco  Allergies  Meds  Problems  Med Hx  Surg Hx  Fam Hx     Review of Systems:  No other skin or systemic complaints except as noted in HPI or Assessment and Plan.  Objective  Well appearing patient in no apparent distress; mood and affect are within normal limits.  A focused examination was performed including the face. Relevant physical exam findings are noted in the Assessment and Plan.  R ear x 4, L ear x 1 (5) Erythematous thin papules/macules with gritty scale.    Assessment & Plan  AK (actinic keratosis) (5) R ear x 4, L ear x 1  Destruction of lesion - R ear x 4, L ear x 1 Complexity: simple   Destruction method: cryotherapy   Informed consent: discussed and consent obtained   Timeout:  patient name, date of birth, surgical site, and procedure verified Lesion destroyed using liquid nitrogen: Yes   Region frozen until ice ball extended beyond lesion: Yes   Outcome: patient tolerated procedure well with no complications   Post-procedure details: wound care instructions given    Actinic Damage with PreCancerous Actinic Keratoses Counseling for Topical Chemotherapy Management: Patient exhibits: - Severe, confluent actinic changes with pre-cancerous actinic keratoses that is secondary to cumulative UV radiation exposure over time - Condition that is severe; chronic, not at goal. - diffuse scaly erythematous macules and papules with underlying dyspigmentation - Discussed Prescription "Field  Treatment" topical Chemotherapy for Severe, Chronic Confluent Actinic Changes with Pre-Cancerous Actinic Keratoses Field treatment involves treatment of an entire area of skin that has confluent Actinic Changes (Sun/ Ultraviolet light damage) and PreCancerous Actinic Keratoses by method of PhotoDynamic Therapy (PDT) and/or prescription Topical Chemotherapy agents such as 5-fluorouracil, 5-fluorouracil/calcipotriene, and/or imiquimod.  The purpose is to decrease the number of clinically evident and subclinical PreCancerous lesions to prevent progression to development of skin cancer by chemically destroying early precancer changes that may or may not be visible.  It has been shown to reduce the risk of developing skin cancer in the treated area. As a result of treatment, redness, scaling, crusting, and open sores may occur during treatment course. One or more than one of these methods may be used and may have to be used several times to control, suppress and eliminate the PreCancerous changes. Discussed treatment course, expected reaction, and possible side effects. - Recommend daily broad spectrum sunscreen SPF 30+ to sun-exposed areas, reapply every 2 hours as needed.  - Staying in the shade or wearing long sleeves, sun glasses (UVA+UVB protection) and wide brim hats (4-inch brim around the entire circumference of the hat) are also recommended. - Call for new or changing lesions. - Continue 5FU/Calcipotriene mix BID for 3 more days.   Return in about 8 months (around 11/22/2022) for AK follow up.  Luther Redo, CMA, am acting as scribe for Sarina Ser, MD . Documentation: I have reviewed the above documentation for accuracy and completeness, and I agree with the above.  Sarina Ser,  MD

## 2022-03-23 NOTE — Patient Instructions (Signed)
Due to recent changes in healthcare laws, you may see results of your pathology and/or laboratory studies on MyChart before the doctors have had a chance to review them. We understand that in some cases there may be results that are confusing or concerning to you. Please understand that not all results are received at the same time and often the doctors may need to interpret multiple results in order to provide you with the best plan of care or course of treatment. Therefore, we ask that you please give us 2 business days to thoroughly review all your results before contacting the office for clarification. Should we see a critical lab result, you will be contacted sooner.   If You Need Anything After Your Visit  If you have any questions or concerns for your doctor, please call our main line at [phone] and press option 4 to reach your doctor's medical assistant. If no one answers, please leave a voicemail as directed and we will return your call as soon as possible. Messages left after 4 pm will be answered the following business day.   You may also send us a message via MyChart. We typically respond to MyChart messages within 1-2 business days.  For prescription refills, please ask your pharmacy to contact our office. Our fax number is [phone].  If you have an urgent issue when the clinic is closed that cannot wait until the next business day, you can page your doctor at the number below.    Please note that while we do our best to be available for urgent issues outside of office hours, we are not available 24/7.   If you have an urgent issue and are unable to reach us, you may choose to seek medical care at your doctor's office, retail clinic, urgent care center, or emergency room.  If you have a medical emergency, please immediately call 911 or go to the emergency department.  Pager Numbers  - Dr. Kowalski: [phone]  - Dr. Moye: [phone]  - Dr. Stewart:  [phone]  In the event of inclement weather, please call our main line at [phone] for an update on the status of any delays or closures.  Dermatology Medication Tips: Please keep the boxes that topical medications come in in order to help keep track of the instructions about where and how to use these. Pharmacies typically print the medication instructions only on the boxes and not directly on the medication tubes.   If your medication is too expensive, please contact our office at [phone] option 4 or send us a message through MyChart.   We are unable to tell what your co-pay for medications will be in advance as this is different depending on your insurance coverage. However, we may be able to find a substitute medication at lower cost or fill out paperwork to get insurance to cover a needed medication.   If a prior authorization is required to get your medication covered by your insurance company, please allow us 1-2 business days to complete this process.  Drug prices often vary depending on where the prescription is filled and some pharmacies may offer cheaper prices.  The website www.goodrx.com contains coupons for medications through different pharmacies. The prices here do not account for what the cost may be with help from insurance (it may be cheaper with your insurance), but the website can give you the price if you did not use any insurance.  - You can print the associated coupon and take it with   your prescription to the pharmacy.  - You may also stop by our office during regular business hours and pick up a GoodRx coupon card.  - If you need your prescription sent electronically to a different pharmacy, notify our office through [facility] MyChart or by phone at [phone] option 4.     Si Usted Necesita Algo Despus de Su Visita  Tambin puede enviarnos un mensaje a travs de MyChart. Por lo general respondemos a los mensajes de MyChart en el transcurso de 1 a 2  das hbiles.  Para renovar recetas, por favor pida a su farmacia que se ponga en contacto con nuestra oficina. Nuestro nmero de fax es el [phone].  Si tiene un asunto urgente cuando la clnica est cerrada y que no puede esperar hasta el siguiente da hbil, puede llamar/localizar a su doctor(a) al nmero que aparece a continuacin.   Por favor, tenga en cuenta que aunque hacemos todo lo posible para estar disponibles para asuntos urgentes fuera del horario de oficina, no estamos disponibles las 24 horas del da, los 7 das de la semana.   Si tiene un problema urgente y no puede comunicarse con nosotros, puede optar por buscar atencin mdica  en el consultorio de su doctor(a), en una clnica privada, en un centro de atencin urgente o en una sala de emergencias.  Si tiene una emergencia mdica, por favor llame inmediatamente al 911 o vaya a la sala de emergencias.  Nmeros de bper  - Dr. Kowalski: [phone]  - Dra. Moye: [phone]  - Dra. Stewart: [phone]  En caso de inclemencias del tiempo, por favor llame a nuestra lnea principal al [phone] para una actualizacin sobre el estado de cualquier retraso o cierre.  Consejos para la medicacin en dermatologa: Por favor, guarde las cajas en las que vienen los medicamentos de uso tpico para ayudarle a seguir las instrucciones sobre dnde y cmo usarlos. Las farmacias generalmente imprimen las instrucciones del medicamento slo en las cajas y no directamente en los tubos del medicamento.   Si su medicamento es muy caro, por favor, pngase en contacto con nuestra oficina llamando al [phone] y presione la opcin 4 o envenos un mensaje a travs de MyChart.   No podemos decirle cul ser su copago por los medicamentos por adelantado ya que esto es diferente dependiendo de la cobertura de su seguro. Sin embargo, es posible que podamos encontrar un medicamento sustituto a menor costo o llenar un formulario para que el  seguro cubra el medicamento que se considera necesario.   Si se requiere una autorizacin previa para que su compaa de seguros cubra su medicamento, por favor permtanos de 1 a 2 das hbiles para completar este proceso.  Los precios de los medicamentos varan con frecuencia dependiendo del lugar de dnde se surte la receta y alguna farmacias pueden ofrecer precios ms baratos.  El sitio web www.goodrx.com tiene cupones para medicamentos de diferentes farmacias. Los precios aqu no tienen en cuenta lo que podra costar con la ayuda del seguro (puede ser ms barato con su seguro), pero el sitio web puede darle el precio si no utiliz ningn seguro.  - Puede imprimir el cupn correspondiente y llevarlo con su receta a la farmacia.  - Tambin puede pasar por nuestra oficina durante el horario de atencin regular y recoger una tarjeta de cupones de GoodRx.  - Si necesita que su receta se enve electrnicamente a una farmacia diferente, informe a nuestra oficina a travs de MyChart de [facility]   o por telfono llamando al [phone] y presione la opcin 4.  

## 2022-04-05 ENCOUNTER — Encounter: Payer: Self-pay | Admitting: Dermatology

## 2022-05-14 IMAGING — US US SCROTUM W/ DOPPLER COMPLETE
1 series · 13 of 25 positions shown · non-contrast
Comparison: CT 03/08/2020

CLINICAL DATA: Scrotal swelling

EXAM:
SCROTAL ULTRASOUND
DOPPLER ULTRASOUND OF THE TESTICLES
TECHNIQUE: Complete ultrasound examination of the testicles, epididymis, and
other scrotal structures was performed. Color and spectral Doppler
ultrasound were also utilized to evaluate blood flow to the
testicles.

[Series 1: testis us · 13 of 110 slices shown]
[im 1/110]
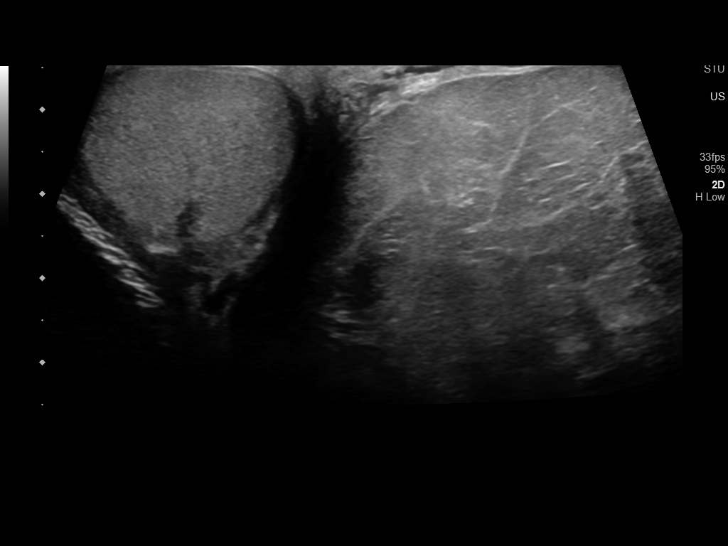
[im 10/110]
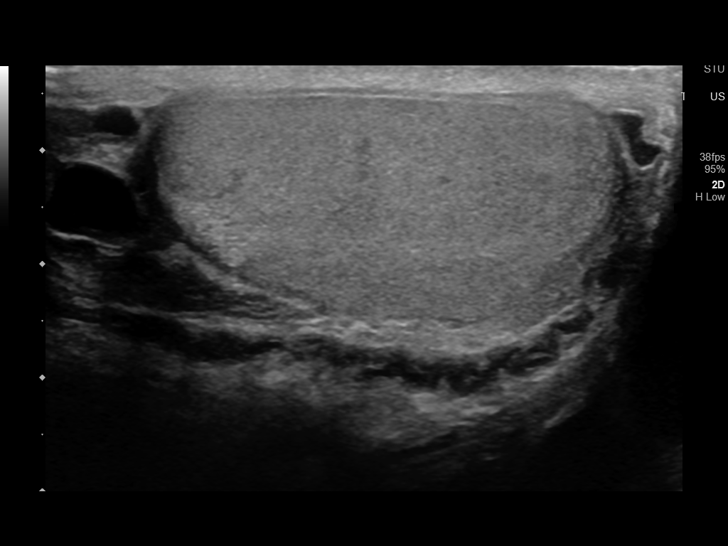
[im 19/110]
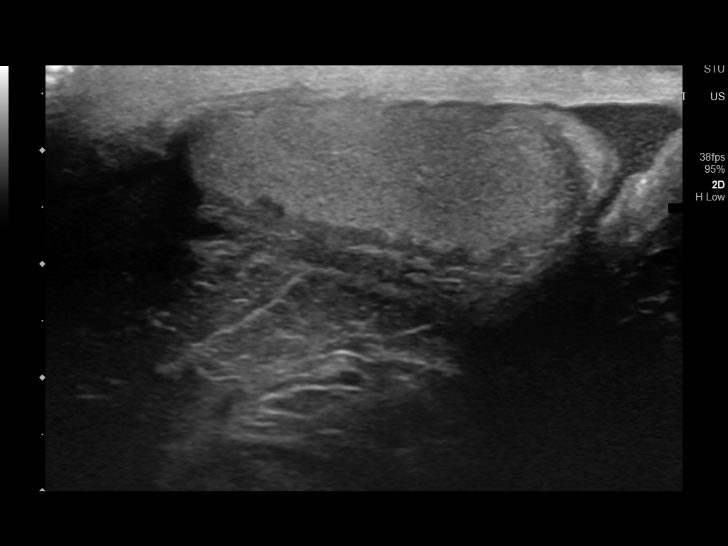
[im 28/110]
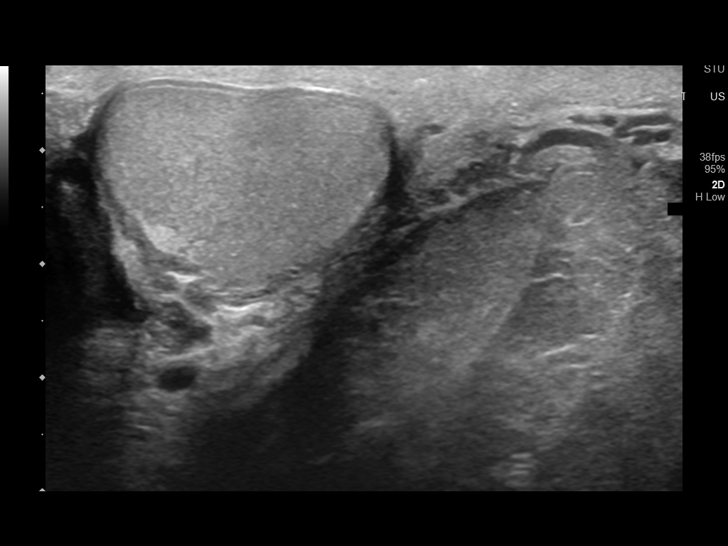
[im 37/110]
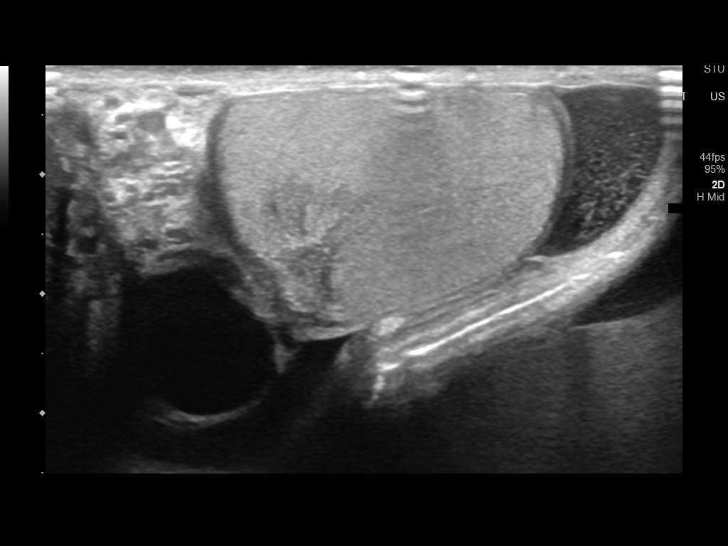
[im 46/110]
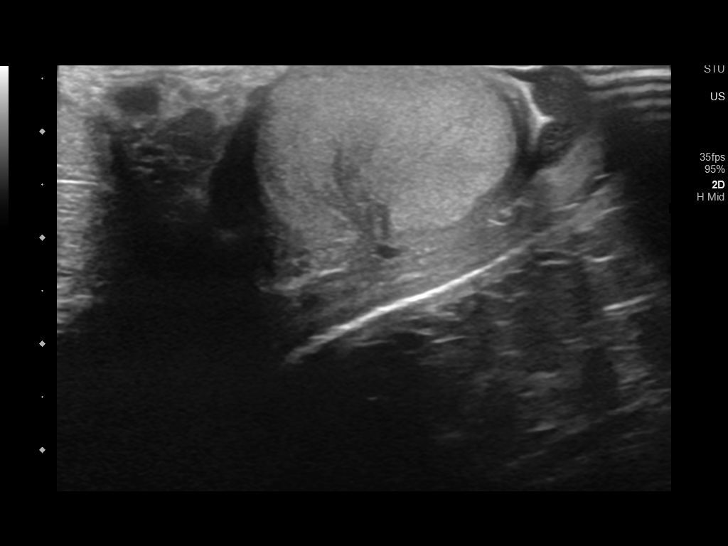
[im 55/110]
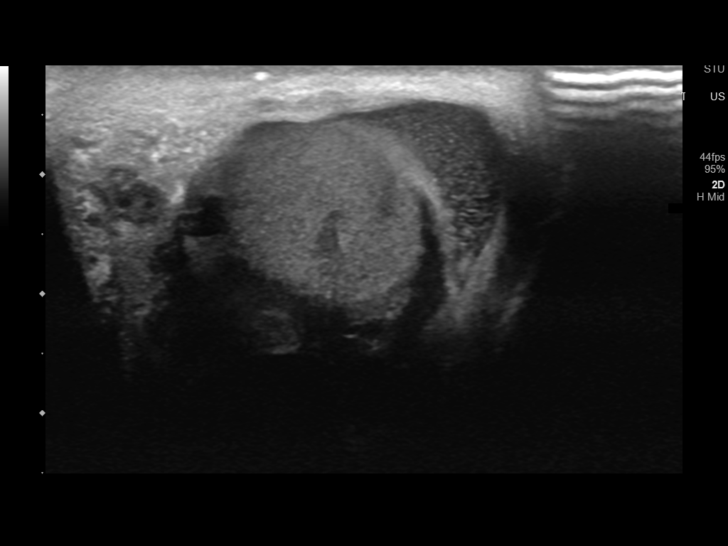
[im 64/110]
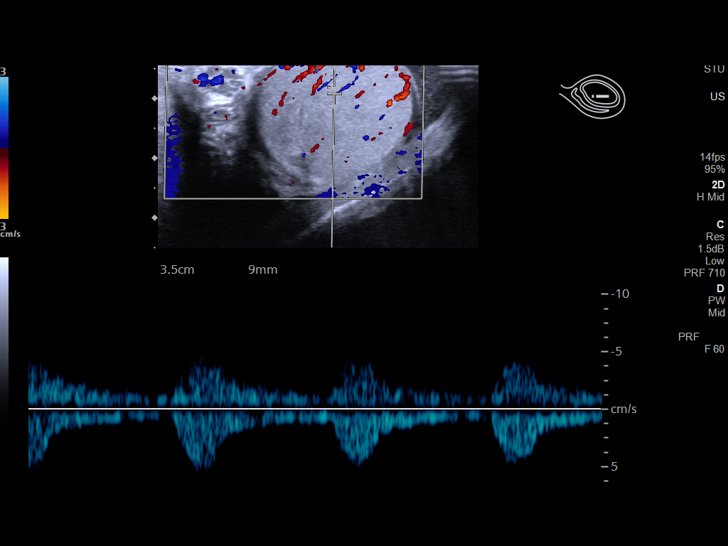
[im 73/110]
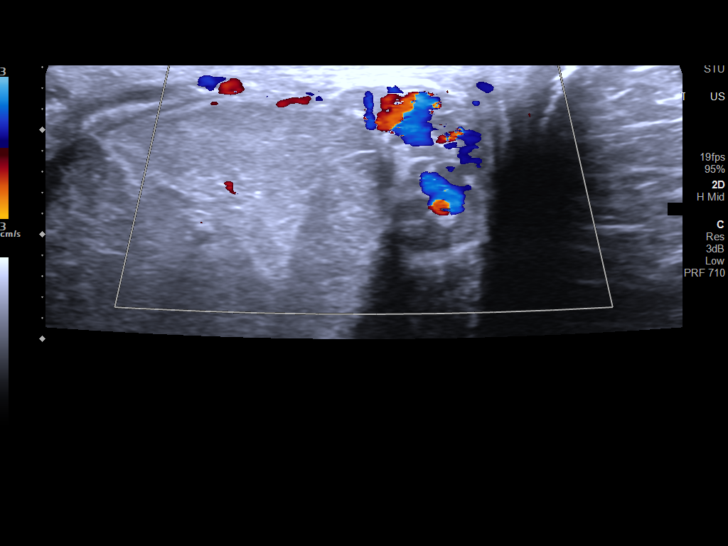
[im 82/110]
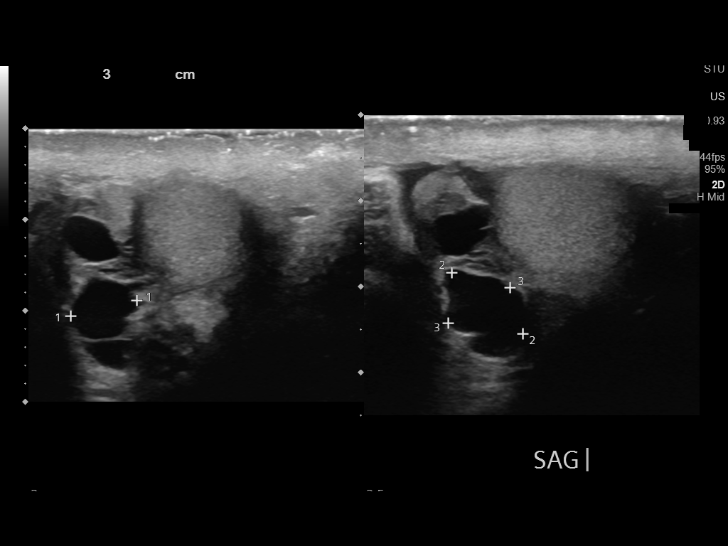
[im 91/110]
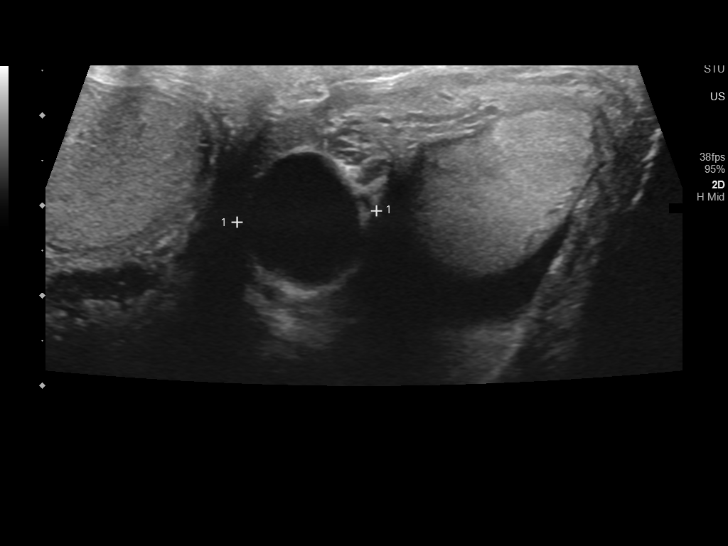
[im 100/110]
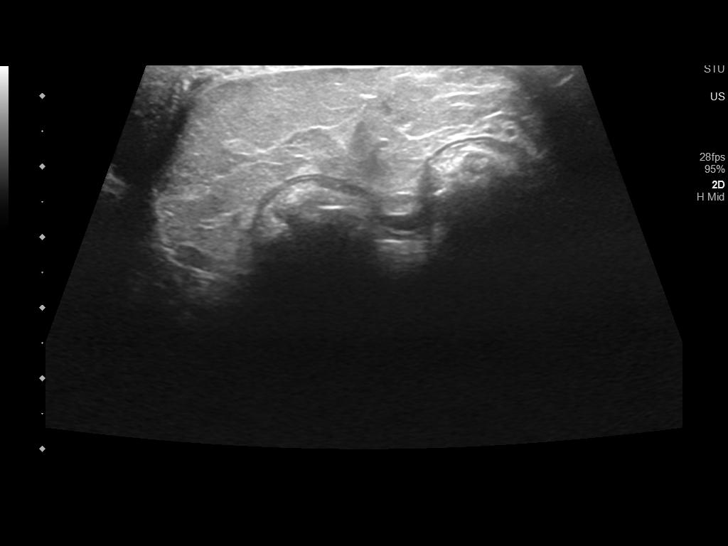
[im 110/110]
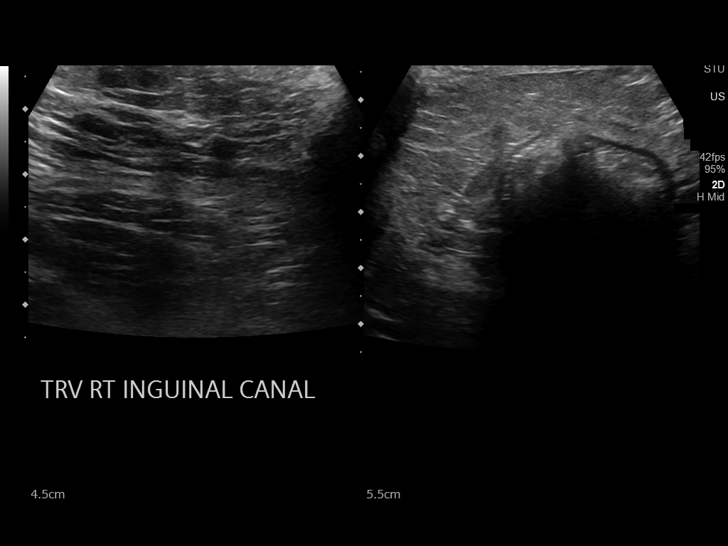

[13 of 25 positions shown; findings below may reference images not displayed]

FINDINGS: Right testicle

Measurements: 4.5 x 2.2 x 2.9 cm. No testicular mass or
microlithiasis visualized.

Left testicle

Measurements: 3.1 x 1.9 x 2.7 cm. No testicular mass or
microlithiasis visualized. Within the left scrotum there appears to
be a large hernia containing fat and a loop of bowel. This is
consistent with findings on prior CT in February 2020.

Right epididymis:  Epididymal cyst measuring 0.8 x 1.1 x 0.8 cm.

Left epididymis:  Epididymal cyst measuring 1.9 x 1.3 x 1.3 cm.

Hydrocele:  None visualized.

Varicocele:  None visualized.

Pulsed Doppler interrogation of both testes demonstrates normal low
resistance arterial and venous waveforms bilaterally.
IMPRESSION: Large fat and bowel containing hernia seen within the left scrotum.

Normal sonographic appearance and Doppler evaluation of the
testicles.

Bilateral epididymal cysts, measuring up to 1.1 cm on the right and
1.9 cm on the left.

## 2022-08-24 NOTE — Progress Notes (Signed)
 Chief Complaint  Patient presents with  . Follow-up  . medicare wellness visit    HPI  Timothy Goodwin is a 88 y.o. here for a Follow up and a Medicare Wellness visit  Hx of Left Inguinal hernia repair in July 2022 but has a recurrence of left inguinal hernia  Declines further intervention at this time  Denies  Chest pains or shortness of breath Stays active - States he is not getting as much exercise as he would like  Appetite is ok. Non smoker No alcohol Bowels are normal. Labs done recently:Hgb :13.9 Sugar:96, ,BUN: 32 Se Creat :2.0, E GFR of 32, A1c; 5.8,Total cholesterol: 156, Triglycerides; 90 ,TSH: 2.125   and  PSA: 1.37    ROS Rest of 10 point review of systems is normal.  Outpatient Encounter Medications as of 08/24/2022  Medication Sig Dispense Refill  . allopurinoL  (ZYLOPRIM ) 100 MG tablet TAKE 1 TABLET EVERY DAY 90 tablet 3  . amLODIPine  (NORVASC ) 2.5 MG tablet Take 1 tablet (2.5 mg total) by mouth once daily 90 tablet 3  . atorvastatin  (LIPITOR) 10 MG tablet TAKE 1 TABLET EVERY DAY 90 tablet 3  . cloNIDine  HCL (CATAPRES ) 0.1 MG tablet TAKE 1 TABLET TWICE DAILY 180 tablet 3  . clopidogreL  (PLAVIX ) 75 mg tablet Take 1 tablet (75 mg total) by mouth once daily 90 tablet 3  . FOLIC ACID/MULTIVIT-MIN/LUTEIN (CENTRUM SILVER ORAL) Take 1 tablet by mouth once daily.    . metoprolol  tartrate (LOPRESSOR ) 25 MG tablet TAKE 1 TABLET TWICE DAILY 180 tablet 3  . vitamin E 200 UNIT capsule Take 200 Units by mouth once daily.    . calcium  carbonate (CORAL CALCIUM  ORAL) Take 400 mg by mouth once daily    . cyanocobalamin (VITAMIN B12) 1000 MCG tablet Take 1,000 mcg by mouth once daily    . ergocalciferol, vitamin D2, 1,250 mcg (50,000 unit) capsule TAKE 1 CAPSULE (50,000 UNITS TOTAL) BY MOUTH MONTHLY. 1 capsule 5  . Herbal Supplement every Monday, Wednesday, and Friday Herbal Name: ISOMER E    . mv-min/folic/K1/lycopen/lutein (CENTRUM SILVER MEN ORAL) Take 1 tablet by mouth once daily     . predniSONE (DELTASONE) 10 MG tablet Take 1 tablet (10 mg total) by mouth once daily (Patient not taking: Reported on 07/03/2021) 12 tablet 0  . predniSONE (DELTASONE) 10 MG tablet Take 1 tablet (10 mg total) by mouth once daily (Patient not taking: Reported on 07/03/2021) 12 tablet 0  . ubidecarenone (COQ-10 ORAL) Take 1 tablet by mouth once daily     No facility-administered encounter medications on file as of 08/24/2022.    Allergies as of 08/24/2022 - Reviewed 08/24/2022  Allergen Reaction Noted  . Aspirin  Shortness Of Breath   . Lisinopril Shortness Of Breath   . Nsaids (non-steroidal anti-inflammatory drug) Swelling 03/21/2012  . Sulfa (sulfonamide antibiotics) Rash     Past Medical History:  Diagnosis Date  . Achilles tendon tear   . Acute blood loss as cause of postoperative anemia 04/05/2018  . Allergic state 2015   NSAIDS  . Anticoagulated on heparin  04/15/2018  . CAD (coronary artery disease)    stent 2000; never had an MI.  . CKD (chronic kidney disease) stage 3, GFR 30-59 ml/min (CMS/HHS-HCC)   . Elevated serum creatinine    1.6  . Gout   . Hyperlipidemia   . Hypertension   . Left inguinal hernia   . Low bicarbonate level 04/06/2018  . Myocardial infarction (CMS/HHS-HCC) 01/01/14  . Prostate  cancer (CMS/HHS-HCC) 1995  . PVD (peripheral vascular disease) (CMS-HCC)    carotid left 50% in early 2005 per his report; repeat carotid 2006 negative    Past Surgical History:  Procedure Laterality Date  . PROSTATE SURGERY  1995  . CORONARY ANGIOPLASTY  2001  . CORONARY ARTERY BYPASS W/SINGLE ARTERY GRAFT N/A 04/05/2018   Procedure: CORONARY ARTERY BYPASS, USING ARTERIAL GRAFT(S); SINGLE ARTERIAL GRAFT left internal mammary artery harvestt;  Surgeon: Markel Morene Gravely, MD;  Location: [facility];  Service: Cardiothoracic;  Laterality: N/A;  . CORONARY ARTERY BYPASS W/VEIN ONLY N/A 04/05/2018   Procedure: CORONARY ARTERY BYPASS, USING VENOUS GRAFT(S) AND  ARTERIAL GRAFT(S); 2 VENOUS GRAFTS  (LIST IN ADDITION TO PRIMARY PROCEDURE);  Surgeon: Markel Morene Gravely, MD;  Location: [facility];  Service: Cardiothoracic;  Laterality: N/A;  . CORONARY ARTERY BYPASS GRAFT  04/06/18  . SMALL INTESTINE ENDOSCOPY W/CONTROL BLEEDING N/A 04/18/2018   Procedure: Small Bowel (Push) Enteroscopy;  Surgeon: Gala Prentice Scarlet, MD;  Location: [facility];  Service: Gastroenterology;  Laterality: N/A;  . CARDIAC CATHETERIZATION    . Hx of prostatectomy    . TONSILLECTOMY      Blood pressure 128/80, pulse 60, height 165.1 cm (5' 5), weight 70.8 kg (156 lb), SpO2 98%.   Exam   Blood pressure 128/80, pulse 60, height 165.1 cm (5' 5), weight 70.8 kg (156 lb), SpO2 98%.  Wt Readings from Last 3 Encounters:  08/24/22 70.8 kg (156 lb)  02/17/22 71.7 kg (158 lb)  08/21/21 71.7 kg (158 lb)   Body mass index is 25.96 kg/m.   General. Alert oriented x3  Eyes. Sclera and conjunctiva clear; pupils equal round and reactive to light extraocular movements intact Skin Rt fcial lesion has improved Ears. External normal; canals clear; tympanic membranes normal Nose. Mucosa healthy without drainage or ulceration Oropharynx. No suspicious lesions Neck. No swelling, masses, stiffness, pain, limited movement, carotid pulses normal bilaterally, thyroid normal size, no masses palpated.  Rt carotid bruit noted  Lungs. Respirations unlabored;Clear to auscultation  Back. No spinal deformity Cardiovascular. Heart regular rate and rhythm without murmurs, gallops, or rubs Abdomen. Soft; non tender; non distended; normoactive bowel sounds; Left inguinal hernia noted  Lymph Nodes. No significant cervical, supraclavicular, axillary or inguinal lymphadenopathy Extremities;  No edema   Neurologic. Alert and oriented; speech intact; face symmetrical; moves all extremities well   Assessment and Plan  1 CAD; S/p NSTEMI in Dec 2019 Hx of CABG at [facility]  On  Atorvastatin  10 mg po qd and Plavix   Sees Dr. Huey  2 HTN:  Stable On  Metoprolol  and  Norvasc  2.5 mg po qd  3 Rt Renal artery stenosis; Asymptomatic  4 Hyperuricemia; Stable -On Allopurinol  5 Hx of Prostate Ca- s/p previous surgery- No longer sees urologist. PSA is 0.91 6 CKD stage 3 - Se Creat is 2.0- eGFR of 32- Increase fluid intake Keep follow up appt with Dr. Douglas 7 Recurrent Left inguinal hernia; Wants to hold off on surgery at this time S/p Robotic assisted hernia repair July 2022  9 Health maintenance: Up to date with Flu shot, Pneumovax, Inj Shingrix  and COVID vaccine Colonoscopy: 2000- Declines repeat Continue to remain active. Labs 1 week prior to next visit  Follow up in 6 months      Tamra Leventhal  MD   Timothy Goodwin is a 88 y.o. here for Medicare Wellness Visit  MEDICARE WELLNESS VISIT  Providers Rendering Care 1. Dr. Vishwanath Hande (  PCP) 2 Dr. Florencio (Cardiology)  3 Dr. Douglas (Nephrology)  4 Dr. Lenn (Ophthalmology)  5 Dr. Otho (Dermatology)   Functional Assessment (1) Hearing: Demonstrates no difficulty in hearing during normal conversation (2) Risk of Falls: Patient denies any falls or near falls in the last year, Gait steady without assistance during walk from waiting area to exam room (3) Home Safety: Patient feels secure in their home, There are operational smoke alarms in multiple areas of the home (4) Activities of Daily Living: Independently manages personal grooming and household chores, including cooking, cleaning and laundry. Manages Personal finances without assistance.  Depression Screening PHQ 2/9 last 3 flowsheet values     02/11/2021    1:15 PM 02/17/2022   10:33 AM 02/17/2022   12:10 PM  PHQ-2/9 Depression Screening   (OBSOLETE) Little interest or pleasure in doing things 0 0 0  (OBSOLETE) Feeling down, depressed, or hopeless (or irritable for Teens only)?  0   (OBSOLETE) Total Prescreening Score 0 0 0   (OBSOLETE) Total Score = 0 0 0     Depression Severity and Treatment Recommendations:  0-4= None  5-9= Mild / Treatment: Support, educate to call if worse; return in one month  10-14= Moderate / Treatment: Support, watchful waiting; Antidepressant or Psychotherapy  15-19= Moderately severe / Treatment: Antidepressant OR Psychotherapy  >= 20 = Major depression, severe / Antidepressant AND Psychotherapy No symptoms of Depression   Cognitive Impairment Patient denies episodes of loosing things, being forgetful. Seems oriented to person, place and time.  Responses appear appropriate and timely to this observer.  PREVENTION PLAN  Cardiovascular: FLP assessed; 08/17/22; LDL ; LDL; 85 Diabetes: A1c or FBG assessed; 08/17/22; A1c; ; A1c; 5.8 Glaucoma: N/A Hepatitis B (HBV) Vaccine:  Not Applicable Smoking Cessation:  Not Applicable  Other Personalized Health Advice  Encouraged patient to exercise 5 days a week, walking, water aerobics, gentle stretching recommended. Increase dietary intake of fresh fruits and vegetables, reduce red meat to twice a week.  End of Life Counseling Patient has living will in place; Has designated her son as his POA - ; Full Code  Current Outpatient Medications  Medication Sig Dispense Refill  . allopurinoL  (ZYLOPRIM ) 100 MG tablet TAKE 1 TABLET EVERY DAY 90 tablet 3  . amLODIPine  (NORVASC ) 2.5 MG tablet Take 1 tablet (2.5 mg total) by mouth once daily 90 tablet 3  . atorvastatin  (LIPITOR) 10 MG tablet TAKE 1 TABLET EVERY DAY 90 tablet 3  . cloNIDine  HCL (CATAPRES ) 0.1 MG tablet TAKE 1 TABLET TWICE DAILY 180 tablet 3  . clopidogreL  (PLAVIX ) 75 mg tablet Take 1 tablet (75 mg total) by mouth once daily 90 tablet 3  . FOLIC ACID/MULTIVIT-MIN/LUTEIN (CENTRUM SILVER ORAL) Take 1 tablet by mouth once daily.    . metoprolol  tartrate (LOPRESSOR ) 25 MG tablet TAKE 1 TABLET TWICE DAILY 180 tablet 3  . vitamin E 200 UNIT capsule Take 200 Units by mouth once daily.     . calcium  carbonate (CORAL CALCIUM  ORAL) Take 400 mg by mouth once daily    . cyanocobalamin (VITAMIN B12) 1000 MCG tablet Take 1,000 mcg by mouth once daily    . ergocalciferol, vitamin D2, 1,250 mcg (50,000 unit) capsule TAKE 1 CAPSULE (50,000 UNITS TOTAL) BY MOUTH MONTHLY. 1 capsule 5  . Herbal Supplement every Monday, Wednesday, and Friday Herbal Name: ISOMER E    . mv-min/folic/K1/lycopen/lutein (CENTRUM SILVER MEN ORAL) Take 1 tablet by mouth once daily    . predniSONE (DELTASONE) 10 MG  tablet Take 1 tablet (10 mg total) by mouth once daily (Patient not taking: Reported on 07/03/2021) 12 tablet 0  . predniSONE (DELTASONE) 10 MG tablet Take 1 tablet (10 mg total) by mouth once daily (Patient not taking: Reported on 07/03/2021) 12 tablet 0  . ubidecarenone (COQ-10 ORAL) Take 1 tablet by mouth once daily     No current facility-administered medications for this visit.    Allergies as of 08/24/2022 - Reviewed 08/24/2022  Allergen Reaction Noted  . Aspirin  Shortness Of Breath   . Lisinopril Shortness Of Breath   . Nsaids (non-steroidal anti-inflammatory drug) Swelling 03/21/2012  . Sulfa (sulfonamide antibiotics) Rash     Patient Active Problem List  Diagnosis  . PSA elevation  . Hypertension  . Hyperlipidemia  . NSTEMI, initial episode of care (CMS/HHS-HCC)  . Cancer of prostate (CMS/HHS-HCC)  . CKD (chronic kidney disease) stage 3, GFR 30-59 ml/min (CMS-HCC)  . 3-vessel coronary artery disease  . Gout  . Atrial fibrillation (CMS/HHS-HCC)  . S/P CABG x 2  . Duodenal ulcer  . Elevated blood sugar level, unspecified    Past Medical History:  Diagnosis Date  . Achilles tendon tear   . Acute blood loss as cause of postoperative anemia 04/05/2018  . Allergic state 2015   NSAIDS  . Anticoagulated on heparin  04/15/2018  . CAD (coronary artery disease)    stent 2000; never had an MI.  . CKD (chronic kidney disease) stage 3, GFR 30-59 ml/min (CMS/HHS-HCC)   . Elevated serum  creatinine    1.6  . Gout   . Hyperlipidemia   . Hypertension   . Left inguinal hernia   . Low bicarbonate level 04/06/2018  . Myocardial infarction (CMS/HHS-HCC) 01/01/14  . Prostate cancer (CMS/HHS-HCC) 1995  . PVD (peripheral vascular disease) (CMS-HCC)    carotid left 50% in early 2005 per his report; repeat carotid 2006 negative    Past Surgical History:  Procedure Laterality Date  . PROSTATE SURGERY  1995  . CORONARY ANGIOPLASTY  2001  . CORONARY ARTERY BYPASS W/SINGLE ARTERY GRAFT N/A 04/05/2018   Procedure: CORONARY ARTERY BYPASS, USING ARTERIAL GRAFT(S); SINGLE ARTERIAL GRAFT left internal mammary artery harvestt;  Surgeon: Markel Morene Gravely, MD;  Location: [facility];  Service: Cardiothoracic;  Laterality: N/A;  . CORONARY ARTERY BYPASS W/VEIN ONLY N/A 04/05/2018   Procedure: CORONARY ARTERY BYPASS, USING VENOUS GRAFT(S) AND ARTERIAL GRAFT(S); 2 VENOUS GRAFTS  (LIST IN ADDITION TO PRIMARY PROCEDURE);  Surgeon: Markel Morene Gravely, MD;  Location: [facility];  Service: Cardiothoracic;  Laterality: N/A;  . CORONARY ARTERY BYPASS GRAFT  04/06/18  . SMALL INTESTINE ENDOSCOPY W/CONTROL BLEEDING N/A 04/18/2018   Procedure: Small Bowel (Push) Enteroscopy;  Surgeon: Gala Prentice Scarlet, MD;  Location: [facility];  Service: Gastroenterology;  Laterality: N/A;  . CARDIAC CATHETERIZATION    . Hx of prostatectomy    . TONSILLECTOMY      Health Maintenance  Topic Date Due  . Serum Phosphorus  06/12/2022  . Parathyroid Hormone  06/12/2022  . Depression Screening  02/18/2023  . Medicare Initial or AWV  02/18/2023  . Creatinine Level  08/17/2023  . Potassium Level  08/17/2023  . Lipid Panel  08/17/2023  . Annual Urine Albumin Creatinine Ratio  08/17/2023  . Serum Bicarbonate  08/17/2023  . Serum Calcium   08/17/2023  . Diabetes Screening  08/16/2025  . Adult Tetanus (Td And Tdap)  12/16/2025  . COVID-19 Vaccine  Completed  . Pneumococcal Vaccine: 65+  Completed  . Shingrix  Completed  . Influenza Vaccine  Completed  . Hib Vaccines  Aged Out  . Hepatitis A Vaccines  Aged Out  . Meningococcal ACWY Vaccine  Aged Out  . HPV Vaccines  Aged Out    Vitals:   08/24/22 0858  BP: 128/80  Pulse: 60  SpO2: 98%  Weight: 70.8 kg (156 lb)  Height: 165.1 cm (5' 5)  PainSc: 0-No pain   Body mass index is 25.96 kg/m.  Assessment/Plan  1. Medicare wellness visit- Medications and allergies reviewed. Copy of preventative health provided.  Labs reviewed.   TAMRA ETHELENE LEVENTHAL, MD

## 2022-12-30 ENCOUNTER — Ambulatory Visit (INDEPENDENT_AMBULATORY_CARE_PROVIDER_SITE_OTHER): Payer: Medicare PPO | Admitting: Dermatology

## 2022-12-30 ENCOUNTER — Encounter: Payer: Self-pay | Admitting: Dermatology

## 2022-12-30 VITALS — BP 148/76 | HR 75

## 2022-12-30 DIAGNOSIS — L570 Actinic keratosis: Secondary | ICD-10-CM | POA: Diagnosis not present

## 2022-12-30 DIAGNOSIS — W908XXA Exposure to other nonionizing radiation, initial encounter: Secondary | ICD-10-CM | POA: Diagnosis not present

## 2022-12-30 DIAGNOSIS — Z79899 Other long term (current) drug therapy: Secondary | ICD-10-CM

## 2022-12-30 DIAGNOSIS — L578 Other skin changes due to chronic exposure to nonionizing radiation: Secondary | ICD-10-CM | POA: Diagnosis not present

## 2022-12-30 DIAGNOSIS — Z7189 Other specified counseling: Secondary | ICD-10-CM

## 2022-12-30 DIAGNOSIS — L820 Inflamed seborrheic keratosis: Secondary | ICD-10-CM | POA: Diagnosis not present

## 2022-12-30 DIAGNOSIS — Z5111 Encounter for antineoplastic chemotherapy: Secondary | ICD-10-CM

## 2022-12-30 NOTE — Progress Notes (Signed)
Follow-Up Visit   Subjective  Timothy Goodwin is a 88 y.o. male who presents for the following: 8 month ak and isk follow up. Hx of aks at scalp and face  The patient has spots, moles and lesions to be evaluated, some may be new or changing and the patient may have concern these could be cancer.   The following portions of the chart were reviewed this encounter and updated as appropriate: medications, allergies, medical history  Review of Systems:  No other skin or systemic complaints except as noted in HPI or Assessment and Plan.  Objective  Well appearing patient in no apparent distress; mood and affect are within normal limits.  A focused examination was performed of the following areas: Scalp, face, ears,   Relevant exam findings are noted in the Assessment and Plan.  Scalp x 17 (17) Erythematous thin papules/macules with gritty scale.   scalp x 3 (3) Erythematous stuck-on, waxy papule or plaque    Assessment & Plan   ACTINIC DAMAGE WITH PRECANCEROUS ACTINIC KERATOSES Counseling for Topical Chemotherapy Management: Patient exhibits: - Severe, confluent actinic changes with pre-cancerous actinic keratoses that is secondary to cumulative UV radiation exposure over time - Condition that is severe; chronic, not at goal. - diffuse scaly erythematous macules and papules with underlying dyspigmentation - Discussed Prescription "Field Treatment" topical Chemotherapy for Severe, Chronic Confluent Actinic Changes with Pre-Cancerous Actinic Keratoses Field treatment involves treatment of an entire area of skin that has confluent Actinic Changes (Sun/ Ultraviolet light damage) and PreCancerous Actinic Keratoses by method of PhotoDynamic Therapy (PDT) and/or prescription Topical Chemotherapy agents such as 5-fluorouracil, 5-fluorouracil/calcipotriene, and/or imiquimod.  The purpose is to decrease the number of clinically evident and subclinical PreCancerous lesions to prevent  progression to development of skin cancer by chemically destroying early precancer changes that may or may not be visible.  It has been shown to reduce the risk of developing skin cancer in the treated area. As a result of treatment, redness, scaling, crusting, and open sores may occur during treatment course. One or more than one of these methods may be used and may have to be used several times to control, suppress and eliminate the PreCancerous changes. Discussed treatment course, expected reaction, and possible side effects. - Recommend daily broad spectrum sunscreen SPF 30+ to sun-exposed areas, reapply every 2 hours as needed.  - Staying in the shade or wearing long sleeves, sun glasses (UVA+UVB protection) and wide brim hats (4-inch brim around the entire circumference of the hat) are also recommended. - Call for new or changing lesions.  - Will schedule in 6 weeks Red light photodynamic therapy to the superior forehead and anterior scalp with debridement.      Actinic keratosis (17) Scalp x 17  Actinic keratoses are precancerous spots that appear secondary to cumulative UV radiation exposure/sun exposure over time. They are chronic with expected duration over 1 year. A portion of actinic keratoses will progress to squamous cell carcinoma of the skin. It is not possible to reliably predict which spots will progress to skin cancer and so treatment is recommended to prevent development of skin cancer.  Recommend daily broad spectrum sunscreen SPF 30+ to sun-exposed areas, reapply every 2 hours as needed.  Recommend staying in the shade or wearing long sleeves, sun glasses (UVA+UVB protection) and wide brim hats (4-inch brim around the entire circumference of the hat). Call for new or changing lesions.  Destruction of lesion - Scalp x 17 (17) Complexity: simple  Destruction method: cryotherapy   Informed consent: discussed and consent obtained   Timeout:  patient name, date of birth,  surgical site, and procedure verified Lesion destroyed using liquid nitrogen: Yes   Region frozen until ice ball extended beyond lesion: Yes   Outcome: patient tolerated procedure well with no complications   Post-procedure details: wound care instructions given    Inflamed seborrheic keratosis (3) scalp x 3  Symptomatic, irritating, patient would like treated.  Destruction of lesion - scalp x 3 (3) Complexity: simple   Destruction method: cryotherapy   Informed consent: discussed and consent obtained   Timeout:  patient name, date of birth, surgical site, and procedure verified Lesion destroyed using liquid nitrogen: Yes   Region frozen until ice ball extended beyond lesion: Yes   Outcome: patient tolerated procedure well with no complications   Post-procedure details: wound care instructions given      Return for 6 week pdt with debridement to anterior scalp and superior forehead, 6 month follow up.  IAsher Muir, CMA, am acting as scribe for Armida Sans, MD.   Documentation: I have reviewed the above documentation for accuracy and completeness, and I agree with the above.  Armida Sans, MD

## 2022-12-30 NOTE — Patient Instructions (Addendum)
Photodynamic Therapy/Red  Light Therapy  Actinic keratoses are the dry, red scaly spots on the skin caused by sun damage. A portion of these spots can turn into skin cancer with time, and treating them can help prevent development of skin cancer.   Treatment of these spots requires removal of the defective skin cells. There are various ways to remove actinic keratoses, including freezing with liquid nitrogen, treatment with creams, or treatment with a blue light procedure in the office.   Photodynamic Therapy (PDT), also known as "Red light therapy" is an in office procedure used to treat actinic keratoses. It works by targeting precancerous cells. After treatment, these cells peel off and are replaced by healthy ones.   For your phototherapy appointment, you will have two appointments on the day of your treatment. The first appointment will be to apply a cream to the treatment area. You will leave this cream on for 1-2 hours depending on the area being treated. The second appointment will be to shine a blue light on the area for 16 minutes to kill off the precancer cells. It is common to experience a burning sensation during the treatment.  After your treatment, it will be important to keep the treated areas of skin out of the sun completely for 48-72 hours (2-3 days) to prevent having a reaction.   Common side effects include: - Burning or stinging, which may be severe and can last up to 24-72 hours after your treatment - Scaling and crusting which may last up to 2 weeks - Redness, swelling and/or peeling which can last up to 4 weeks  To Care for Your Skin After PDT/Red Light Therapy: - Wash with soap, water and shampoo as normal. - If needed, you can use cold compresses (e.g. ice packs) for comfort - If okay with your primary care doctor, you may use analgesics such as acetaminophen (tylenol) every 4-6 hours, not to exceed recommended dose - You may apply Cerave Healing Ointment, Vaseline or  Aquaphor as needed - If you have a lot of swelling you may take a Benadryl to help with this (this may cause drowsiness), not to exceed recommended dose. This may increase the risk of falls in people over 65 and may slow reaction time while driving, so it is not recommended to take before driving or operating machinery. - Sun Precautions - Wear a wide brim hat for the next week if outside  - Wear a sunblock with zinc or titanium dioxide at least SPF 50 daily  If you have any questions or concerns, please call the office and ask to speak with a nurse.   --------------------------------------------------------------------------------------------------------------      Actinic keratoses are precancerous spots that appear secondary to cumulative UV radiation exposure/sun exposure over time. They are chronic with expected duration over 1 year. A portion of actinic keratoses will progress to squamous cell carcinoma of the skin. It is not possible to reliably predict which spots will progress to skin cancer and so treatment is recommended to prevent development of skin cancer.  Recommend daily broad spectrum sunscreen SPF 30+ to sun-exposed areas, reapply every 2 hours as needed.  Recommend staying in the shade or wearing long sleeves, sun glasses (UVA+UVB protection) and wide brim hats (4-inch brim around the entire circumference of the hat). Call for new or changing lesions.   Cryotherapy Aftercare  Wash gently with soap and water everyday.   Apply Vaseline and Band-Aid daily until healed.      Due to  recent changes in healthcare laws, you may see results of your pathology and/or laboratory studies on MyChart before the doctors have had a chance to review them. We understand that in some cases there may be results that are confusing or concerning to you. Please understand that not all results are received at the same time and often the doctors may need to interpret multiple results in order to  provide you with the best plan of care or course of treatment. Therefore, we ask that you please give [place] 2 business days to thoroughly review all your results before contacting the office for clarification. Should we see a critical lab result, you will be contacted sooner.   If You Need Anything After Your Visit  If you have any questions or concerns for your doctor, please call our main line at [phone] and press option 4 to reach your doctor's medical assistant. If no one answers, please leave a voicemail as directed and we will return your call as soon as possible. Messages left after 4 pm will be answered the following business day.   You may also send [place] a message via MyChart. We typically respond to MyChart messages within 1-2 business days.  For prescription refills, please ask your pharmacy to contact our office. Our fax number is [phone].  If you have an urgent issue when the clinic is closed that cannot wait until the next business day, you can page your doctor at the number below.    Please note that while we do our best to be available for urgent issues outside of office hours, we are not available 24/7.   If you have an urgent issue and are unable to reach [place], you may choose to seek medical care at your doctor's office, retail clinic, urgent care center, or emergency room.  If you have a medical emergency, please immediately call 911 or go to the emergency department.  Pager Numbers  - Dr. Gwen Pounds: [phone]  - Dr. Roseanne Reno: [phone]  - Dr. Katrinka Blazing: [phone]   In the event of inclement weather, please call our main line at [phone] for an update on the status of any delays or closures.  Dermatology Medication Tips: Please keep the boxes that topical medications come in in order to help keep track of the instructions about where and how to use these. Pharmacies typically print the medication instructions only on the boxes and not directly on the  medication tubes.   If your medication is too expensive, please contact our office at [phone] option 4 or send [place] a message through MyChart.   We are unable to tell what your co-pay for medications will be in advance as this is different depending on your insurance coverage. However, we may be able to find a substitute medication at lower cost or fill out paperwork to get insurance to cover a needed medication.   If a prior authorization is required to get your medication covered by your insurance company, please allow [place] 1-2 business days to complete this process.  Drug prices often vary depending on where the prescription is filled and some pharmacies may offer cheaper prices.  The website www.goodrx.com contains coupons for medications through different pharmacies. The prices here do not account for what the cost may be with help from insurance (it may be cheaper with your insurance), but the website can give you the price if you did not use any insurance.  - You can print the associated coupon and take it with  your prescription to the pharmacy.  - You may also stop by our office during regular business hours and pick up a GoodRx coupon card.  - If you need your prescription sent electronically to a different pharmacy, notify our office through [facility] or by phone at [phone] option 4.     Si Usted Necesita Algo Despus de Su Visita  Tambin puede enviarnos un mensaje a travs de Clinical cytogeneticist. Por lo general respondemos a los mensajes de MyChart en el transcurso de 1 a 2 das hbiles.  Para renovar recetas, por favor pida a su farmacia que se ponga en contacto con nuestra oficina. Annie Sable de fax es [place] [phone].  Si tiene un asunto urgente cuando la clnica est cerrada y que no puede esperar hasta el siguiente da hbil, puede llamar/localizar a su doctor(a) al nmero que aparece a continuacin.   Por favor, tenga en cuenta que aunque hacemos todo lo posible  para estar disponibles para asuntos urgentes fuera del horario de [place], no estamos disponibles las 24 horas del da, los 7 [address]  [address] de la [place].   Si tiene un problema urgente y no puede comunicarse con nosotros, puede optar por buscar atencin mdica  en el consultorio de su doctor(a), en una clnica privada, en un centro de atencin urgente o en una sala de emergencias.  Si tiene Engineer, drilling, por favor llame inmediatamente al 911 o vaya a la sala de emergencias.  Nmeros de bper  - Dr. Gwen Pounds: [phone]  - Dra. Roseanne Reno: [phone]  - Dr. Katrinka Blazing: [phone]   En caso de inclemencias del tiempo, por favor llame a Lacy Duverney principal al [phone] para una actualizacin sobre el [place] de cualquier retraso o cierre.  Consejos para la medicacin en dermatologa: Por favor, guarde las cajas en las que vienen los medicamentos de uso tpico para ayudarle a seguir las instrucciones sobre dnde y cmo usarlos. Las farmacias generalmente imprimen las instrucciones del medicamento slo en las cajas y no directamente en los tubos del [place].   Si su medicamento es muy caro, por favor, pngase en contacto con Rolm Gala llamando al [phone] y presione la opcin 4 o envenos un mensaje a travs de Clinical cytogeneticist.   No podemos decirle cul ser su copago por los medicamentos por adelantado ya que esto es diferente dependiendo de la cobertura de su seguro. Sin embargo, es posible que podamos encontrar un medicamento sustituto a Audiological scientist un formulario para que el seguro cubra el medicamento que se considera necesario.   Si se requiere una autorizacin previa para que su compaa de seguros [place] su medicamento, por favor permtanos de 1 a 2 das hbiles para completar [address]  Los precios de los medicamentos varan con frecuencia dependiendo del Environmental consultant de dnde se surte la receta y alguna farmacias pueden ofrecer precios ms baratos.  El sitio web  www.goodrx.com tiene cupones para medicamentos de Health and safety inspector. Los precios aqu no tienen en cuenta lo que podra costar con la ayuda del seguro (puede ser ms barato con su seguro), pero el sitio web puede darle el precio si no utiliz Tourist information centre manager.  - Puede imprimir el cupn correspondiente y llevarlo con su receta a la farmacia.  - Tambin puede pasar por nuestra oficina durante el horario de atencin regular y Education officer, museum una tarjeta de cupones de GoodRx.  - Si necesita que su receta se enve electrnicamente a Psychiatrist, informe a nuestra oficina a travs de MyChart de [employer]  [facility] o por telfono llamando al [phone] y presione la opcin 4.

## 2023-01-02 ENCOUNTER — Encounter: Payer: Self-pay | Admitting: Dermatology

## 2023-02-15 ENCOUNTER — Ambulatory Visit: Payer: Medicare PPO

## 2023-02-17 ENCOUNTER — Ambulatory Visit (INDEPENDENT_AMBULATORY_CARE_PROVIDER_SITE_OTHER): Payer: Medicare PPO | Admitting: Dermatology

## 2023-02-17 DIAGNOSIS — L570 Actinic keratosis: Secondary | ICD-10-CM

## 2023-02-17 MED ORDER — AMINOLEVULINIC ACID HCL 10 % EX GEL
2000.0000 mg | Freq: Once | CUTANEOUS | Status: AC
  Administered 2023-02-17: 2000 mg via TOPICAL

## 2023-02-17 NOTE — Patient Instructions (Signed)

## 2023-02-17 NOTE — Progress Notes (Signed)
Patient completed red light phototherapy with debridement today.  ACTINIC KERATOSES Exam: Erythematous thin papules/macules with gritty scale.  Treatment Plan:  Red Light Photodynamic therapy  Procedure discussed: discussed risks, benefits, side effects. and alternatives   Prep: site scrubbed/prepped with acetone   Debridement needed: Yes (performed by Physician with sand paper.  (CPT [id number])) Location:  scalp and temples Number of lesions:  Multiple (> 15) Type of treatment:  Red light Aminolevulinic Acid (see MAR for details): Ameluz Aminolevulinic Acid comment:  [id number] Amount of Ameluz (mg):  1 Incubation time (minutes):  120 Number of minutes under lamp:  20 (Dr. Gwen Pounds would like back of scalp treated at 10 minutes, then front of scalp and temples treated at 10 minutes) Cooling:  Fan Outcome: patient tolerated procedure well with no complications   Post-procedure details: sunscreen applied and aftercare instructions given to patient    Related Medications Aminolevulinic Acid HCl 10 % GEL 2,000 mg  Armida Sans  I personally debrided area prior to application of aminolevulinic acid  Documentation: I have reviewed the above documentation for accuracy and completeness, and I agree with the above.  Armida Sans, MD

## 2023-02-27 ENCOUNTER — Encounter: Payer: Self-pay | Admitting: Dermatology

## 2023-06-30 ENCOUNTER — Encounter: Payer: Self-pay | Admitting: Dermatology

## 2023-06-30 ENCOUNTER — Ambulatory Visit: Payer: Medicare PPO | Admitting: Dermatology

## 2023-06-30 DIAGNOSIS — Z79899 Other long term (current) drug therapy: Secondary | ICD-10-CM

## 2023-06-30 DIAGNOSIS — Z1283 Encounter for screening for malignant neoplasm of skin: Secondary | ICD-10-CM

## 2023-06-30 DIAGNOSIS — L570 Actinic keratosis: Secondary | ICD-10-CM | POA: Diagnosis not present

## 2023-06-30 DIAGNOSIS — L814 Other melanin hyperpigmentation: Secondary | ICD-10-CM | POA: Diagnosis not present

## 2023-06-30 DIAGNOSIS — Z85828 Personal history of other malignant neoplasm of skin: Secondary | ICD-10-CM

## 2023-06-30 DIAGNOSIS — L820 Inflamed seborrheic keratosis: Secondary | ICD-10-CM | POA: Diagnosis not present

## 2023-06-30 DIAGNOSIS — D1801 Hemangioma of skin and subcutaneous tissue: Secondary | ICD-10-CM

## 2023-06-30 DIAGNOSIS — W908XXA Exposure to other nonionizing radiation, initial encounter: Secondary | ICD-10-CM | POA: Diagnosis not present

## 2023-06-30 DIAGNOSIS — Z7189 Other specified counseling: Secondary | ICD-10-CM

## 2023-06-30 DIAGNOSIS — L578 Other skin changes due to chronic exposure to nonionizing radiation: Secondary | ICD-10-CM

## 2023-06-30 DIAGNOSIS — Z5111 Encounter for antineoplastic chemotherapy: Secondary | ICD-10-CM

## 2023-06-30 DIAGNOSIS — D692 Other nonthrombocytopenic purpura: Secondary | ICD-10-CM

## 2023-06-30 DIAGNOSIS — L2089 Other atopic dermatitis: Secondary | ICD-10-CM

## 2023-06-30 DIAGNOSIS — L209 Atopic dermatitis, unspecified: Secondary | ICD-10-CM

## 2023-06-30 DIAGNOSIS — L821 Other seborrheic keratosis: Secondary | ICD-10-CM

## 2023-06-30 DIAGNOSIS — D229 Melanocytic nevi, unspecified: Secondary | ICD-10-CM

## 2023-06-30 MED ORDER — TRIAMCINOLONE ACETONIDE 0.1 % EX CREA: TOPICAL_CREAM | CUTANEOUS | 1 refills | Status: DC

## 2023-06-30 NOTE — Patient Instructions (Addendum)
 For itchy rash at back  Start triamcinolone 0.1 % cream - apply topically to affected areas once daily 5 days a week as needed.   Avoid applying to face, groin, and axilla. Use as directed. Long-term use can cause thinning of the skin.   Topical steroids (such as triamcinolone, fluocinolone, fluocinonide, mometasone, clobetasol, halobetasol, betamethasone, hydrocortisone) can cause thinning and lightening of the skin if they are used for too long in the same area. Your physician has selected the right strength medicine for your problem and area affected on the body. Please use your medication only as directed by your physician to prevent side effects.        Instructions for [facility] Medications  One or more of your medications was sent to the [facility] mail order compounding pharmacy. You will receive an email from them and can purchase the medicine through that link. It will then be mailed to your home at the address you confirmed. If for any reason you do not receive an email from them, please check your spam folder. If you still do not find the email, please let us know. [facility] phone number is [phone].   Start in 1 month   - Start 5-fluorouracil/calcipotriene cream twice a day for 7 days to affected areas including scalp and forehead. Prescription sent to [facility]. Patient advised they will receive an email to purchase the medication online and have it sent to their home. Patient provided with handout reviewing treatment course and side effects and advised to call or message [place] on MyChart with any concerns.  Reviewed course of treatment and expected reaction.  Patient advised to expect inflammation and crusting and advised that erosions are possible.  Patient advised to be diligent with sun protection during and after treatment. Counseled to keep medication out of reach of children and pets.    5-Fluorouracil/Calcipotriene Patient  Education   Actinic keratoses are the dry, red scaly spots on the skin caused by sun damage. A portion of these spots can turn into skin cancer with time, and treating them can help prevent development of skin cancer.   Treatment of these spots requires removal of the defective skin cells. There are various ways to remove actinic keratoses, including freezing with liquid nitrogen, treatment with creams, or treatment with a blue light procedure in the office.   5-fluorouracil cream is a topical cream used to treat actinic keratoses. It works by interfering with the growth of abnormal fast-growing skin cells, such as actinic keratoses. These cells peel off and are replaced by healthy ones.   5-fluorouracil/calcipotriene is a combination of the 5-fluorouracil cream with a vitamin D analog cream called calcipotriene. The calcipotriene alone does not treat actinic keratoses. However, when it is combined with 5-fluorouracil, it helps the 5-fluorouracil treat the actinic keratoses much faster so that the same results can be achieved with a much shorter treatment time.  INSTRUCTIONS FOR 5-FLUOROURACIL/CALCIPOTRIENE CREAM:   5-fluorouracil/calcipotriene cream typically only needs to be used for 4-7 days. A thin layer should be applied twice a day to the treatment areas recommended by your physician.   If your physician prescribed you separate tubes of 5-fluourouracil and calcipotriene, apply a thin layer of 5-fluorouracil followed by a thin layer of calcipotriene.   Avoid contact with your eyes, nostrils, and mouth. Do not use 5-fluorouracil/calcipotriene cream on infected or open wounds.   You will develop redness, irritation and some crusting at areas where you have pre-cancer damage/actinic keratoses.  IF YOU DEVELOP PAIN, BLEEDING, OR SIGNIFICANT CRUSTING, STOP THE TREATMENT EARLY - you have already gotten a good response and the actinic keratoses should clear up well.  Wash your hands after applying  5-fluorouracil 5% cream on your skin.   A moisturizer or sunscreen with a minimum SPF 30 should be applied each morning.   Once you have finished the treatment, you can apply a thin layer of Vaseline twice a day to irritated areas to soothe and calm the areas more quickly. If you experience significant discomfort, contact your physician.  For some patients it is necessary to repeat the treatment for best results.  SIDE EFFECTS: When using 5-fluorouracil/calcipotriene cream, you may have mild irritation, such as redness, dryness, swelling, or a mild burning sensation. This usually resolves within 2 weeks. The more actinic keratoses you have, the more redness and inflammation you can expect during treatment. Eye irritation has been reported rarely. If this occurs, please let us know.  If you have any trouble using this cream, please call the office. If you have any other questions about this information, please do not hesitate to ask me before you leave the office.    Actinic keratoses are precancerous spots that appear secondary to cumulative UV radiation exposure/sun exposure over time. They are chronic with expected duration over 1 year. A portion of actinic keratoses will progress to squamous cell carcinoma of the skin. It is not possible to reliably predict which spots will progress to skin cancer and so treatment is recommended to prevent development of skin cancer.  Recommend daily broad spectrum sunscreen SPF 30+ to sun-exposed areas, reapply every 2 hours as needed.  Recommend staying in the shade or wearing long sleeves, sun glasses (UVA+UVB protection) and wide brim hats (4-inch brim around the entire circumference of the hat). Call for new or changing lesions.    Cryotherapy Aftercare  Wash gently with soap and water everyday.   Apply Vaseline and Band-Aid daily until healed.        Melanoma ABCDEs  Melanoma is the most dangerous type of skin cancer, and is the leading cause  of death from skin disease.  You are more likely to develop melanoma if you: Have light-colored skin, light-colored eyes, or red or blond hair Spend a lot of time in the sun Tan regularly, either outdoors or in a tanning bed Have had blistering sunburns, especially during childhood Have a close family member who has had a melanoma Have atypical moles or large birthmarks  Early detection of melanoma is key since treatment is typically straightforward and cure rates are extremely high if we catch it early.   The first sign of melanoma is often a change in a mole or a new dark spot.  The ABCDE system is a way of remembering the signs of melanoma.  A for asymmetry:  The two halves do not match. B for border:  The edges of the growth are irregular. C for color:  A mixture of colors are present instead of an even brown color. D for diameter:  Melanomas are usually (but not always) greater than 6mm - the size of a pencil eraser. E for evolution:  The spot keeps changing in size, shape, and color.  Please check your skin once per month between visits. You can use a small mirror in front and a large mirror behind you to keep an eye on the back side or your body.   If you see any new or changing lesions  before your next follow-up, please call to schedule a visit.  Please continue daily skin protection including broad spectrum sunscreen SPF 30+ to sun-exposed areas, reapplying every 2 hours as needed when you're outdoors.   Staying in the shade or wearing long sleeves, sun glasses (UVA+UVB protection) and wide brim hats (4-inch brim around the entire circumference of the hat) are also recommended for sun protection.    Due to recent changes in healthcare laws, you may see results of your pathology and/or laboratory studies on MyChart before the doctors have had a chance to review them. We understand that in some cases there may be results that are confusing or concerning to you. Please understand that  not all results are received at the same time and often the doctors may need to interpret multiple results in order to provide you with the best plan of care or course of treatment. Therefore, we ask that you please give [place] 2 business days to thoroughly review all your results before contacting the office for clarification. Should we see a critical lab result, you will be contacted sooner.   If You Need Anything After Your Visit  If you have any questions or concerns for your doctor, please call our main line at [phone] and press option 4 to reach your doctor's medical assistant. If no one answers, please leave a voicemail as directed and we will return your call as soon as possible. Messages left after 4 pm will be answered the following business day.   You may also send [place] a message via MyChart. We typically respond to MyChart messages within 1-2 business days.  For prescription refills, please ask your pharmacy to contact our office. Our fax number is [phone].  If you have an urgent issue when the clinic is closed that cannot wait until the next business day, you can page your doctor at the number below.    Please note that while we do our best to be available for urgent issues outside of office hours, we are not available 24/7.   If you have an urgent issue and are unable to reach [place], you may choose to seek medical care at your doctor's office, retail clinic, urgent care center, or emergency room.  If you have a medical emergency, please immediately call 911 or go to the emergency department.  Pager Numbers  - Dr. Gwen Pounds: [phone]  - Dr. Roseanne Reno: [phone]  - Dr. Katrinka Blazing: [phone]   In the event of inclement weather, please call our main line at [phone] for an update on the status of any delays or closures.  Dermatology Medication Tips: Please keep the boxes that topical medications come in in order to help keep track of the instructions about where and how  to use these. Pharmacies typically print the medication instructions only on the boxes and not directly on the medication tubes.   If your medication is too expensive, please contact our office at [phone] option 4 or send [place] a message through MyChart.   We are unable to tell what your co-pay for medications will be in advance as this is different depending on your insurance coverage. However, we may be able to find a substitute medication at lower cost or fill out paperwork to get insurance to cover a needed medication.   If a prior authorization is required to get your medication covered by your insurance company, please allow [place] 1-2 business days to complete this process.  Drug prices often vary depending on where  the prescription is filled and some pharmacies may offer cheaper prices.  The website www.goodrx.com contains coupons for medications through different pharmacies. The prices here do not account for what the cost may be with help from insurance (it may be cheaper with your insurance), but the website can give you the price if you did not use any insurance.  - You can print the associated coupon and take it with your prescription to the pharmacy.  - You may also stop by our office during regular business hours and pick up a GoodRx coupon card.  - If you need your prescription sent electronically to a different pharmacy, notify our office through [facility] or by phone at [phone] option 4.     Si Usted Necesita Algo Despus de Su Visita  Tambin puede enviarnos un mensaje a travs de Clinical cytogeneticist. Por lo general respondemos a los mensajes de MyChart en el transcurso de 1 a 2 das hbiles.  Para renovar recetas, por favor pida a su farmacia que se ponga en contacto con nuestra oficina. Annie Sable de fax es [place] [phone].  Si tiene un asunto urgente cuando la clnica est cerrada y que no puede esperar hasta el siguiente da hbil, puede llamar/localizar a su  doctor(a) al nmero que aparece a continuacin.   Por favor, tenga en cuenta que aunque hacemos todo lo posible para estar disponibles para asuntos urgentes fuera del horario de [place], no estamos disponibles las 24 horas del da, los 7 [address]  [address] de la [place].   Si tiene un problema urgente y no puede comunicarse con nosotros, puede optar por buscar atencin mdica  en el consultorio de su doctor(a), en una clnica privada, en un centro de atencin urgente o en una sala de emergencias.  Si tiene Engineer, drilling, por favor llame inmediatamente al 911 o vaya a la sala de emergencias.  Nmeros de bper  - Dr. Gwen Pounds: [phone]  - Dra. Roseanne Reno: [phone]  - Dr. Katrinka Blazing: [phone]   En caso de inclemencias del tiempo, por favor llame a Lacy Duverney principal al [phone] para una actualizacin sobre el [place] de cualquier retraso o cierre.  Consejos para la medicacin en dermatologa: Por favor, guarde las cajas en las que vienen los medicamentos de uso tpico para ayudarle a seguir las instrucciones sobre dnde y cmo usarlos. Las farmacias generalmente imprimen las instrucciones del medicamento slo en las cajas y no directamente en los tubos del [place].   Si su medicamento es muy caro, por favor, pngase en contacto con Rolm Gala llamando al [phone] y presione la opcin 4 o envenos un mensaje a travs de Clinical cytogeneticist.   No podemos decirle cul ser su copago por los medicamentos por adelantado ya que esto es diferente dependiendo de la cobertura de su seguro. Sin embargo, es posible que podamos encontrar un medicamento sustituto a Audiological scientist un formulario para que el seguro cubra el medicamento que se considera necesario.   Si se requiere una autorizacin previa para que su compaa de seguros [place] su medicamento, por favor permtanos de 1 a 2 das hbiles para completar [address]  Los precios de los medicamentos varan con frecuencia dependiendo del  Environmental consultant de dnde se surte la receta y alguna farmacias pueden ofrecer precios ms baratos.  El sitio web www.goodrx.com tiene cupones para medicamentos de Health and safety inspector. Los precios aqu no tienen en cuenta lo que podra costar con la ayuda del seguro (puede ser ms barato con su seguro), [employer]  sitio web puede darle el precio si no Visual merchandiser.  - Puede imprimir el cupn correspondiente y llevarlo con su receta a la farmacia.  - Tambin puede pasar por nuestra oficina durante el horario de atencin regular y Education officer, museum una tarjeta de cupones de GoodRx.  - Si necesita que su receta se enve electrnicamente a una farmacia diferente, informe a nuestra oficina a travs de MyChart de [place] o por telfono llamando al [phone] y presione la opcin 4.

## 2023-06-30 NOTE — Progress Notes (Signed)
 Follow-Up Visit   Subjective  Timothy Goodwin is a 89 y.o. male who presents for the following: Skin Cancer Screening and Full Body Skin Exam Hx of aks, hx of bcc, Patient reports he had red light pdt treatment to areas of scalp and forehead last October. He noticed some red areas at forehead and has been using medical honey to treat.  The patient presents for Total-Body Skin Exam (TBSE) for skin cancer screening and mole check. The patient has spots, moles and lesions to be evaluated, some may be new or changing and the patient may have concern these could be cancer.  The following portions of the chart were reviewed this encounter and updated as appropriate: medications, allergies, medical history  Review of Systems:  No other skin or systemic complaints except as noted in HPI or Assessment and Plan.  Objective  Well appearing patient in no apparent distress; mood and affect are within normal limits.  A full examination was performed including scalp, head, eyes, ears, nose, lips, neck, chest, axillae, abdomen, back, buttocks, bilateral upper extremities, bilateral lower extremities, hands, feet, fingers, toes, fingernails, and toenails. All findings within normal limits unless otherwise noted below.   Relevant physical exam findings are noted in the Assessment and Plan.  scalp, face, ears x 18 (18) Erythematous thin papules/macules with gritty scale.  face x 3, left ankle x 1 (4) Erythematous stuck-on, waxy papule or plaque  Assessment & Plan   SKIN CANCER SCREENING PERFORMED TODAY.  LENTIGINES, SEBORRHEIC KERATOSES, HEMANGIOMAS - Benign normal skin lesions - Benign-appearing - Call for any changes  MELANOCYTIC NEVI - Tan-brown and/or pink-flesh-colored symmetric macules and papules - Benign appearing on exam today - Observation - Call clinic for new or changing moles - Recommend daily use of broad spectrum spf 30+ sunscreen to sun-exposed areas.   ATOPIC DERMATITIS Exam:   crusted excoriations at trunk 15 % BSA Chronic and persistent condition with duration or expected duration over one year. Condition is bothersome/symptomatic for patient. Currently flared. Atopic dermatitis (eczema) is a chronic, relapsing, pruritic condition that can significantly affect quality of life. It is often associated with allergic rhinitis and/or asthma and can require treatment with topical medications, phototherapy, or in severe cases biologic injectable medication (Dupixent; Adbry) or Oral JAK inhibitors. Treatment Plan: Start triamcinolone 0.1 % cream - apply topically to affected areas once daily 5 days a week as needed for rash 1lb jar, 1 rf   Avoid applying to face, groin, and axilla. Use as directed. Long-term use can cause thinning of the skin.  Topical steroids (such as triamcinolone, fluocinolone, fluocinonide, mometasone, clobetasol, halobetasol, betamethasone, hydrocortisone) can cause thinning and lightening of the skin if they are used for too long in the same area. Your physician has selected the right strength medicine for your problem and area affected on the body. Please use your medication only as directed by your physician to prevent side effects.   Recommend gentle skin care.  Purpura - Chronic; persistent and recurrent.  Treatable, but not curable. - Violaceous macules and patches - Benign - Related to trauma, age, sun damage and/or use of blood thinners, chronic use of topical and/or oral steroids - Observe - Can use OTC arnica containing moisturizer such as Dermend Bruise Formula if desired - Call for worsening or other concerns  SEBORRHEIC KERATOSIS - Stuck-on, waxy, tan-brown papules and/or plaques  - Benign-appearing - Discussed benign etiology and prognosis. - Observe - Call for any changes  HISTORY OF BASAL CELL CARCINOMA  OF THE SKIN Upper back spinal 11/02/2019  - No evidence of recurrence today - Recommend regular full body skin exams -  Recommend daily broad spectrum sunscreen SPF 30+ to sun-exposed areas, reapply every 2 hours as needed.  - Call if any new or changing lesions are noted between office visits  ACTINIC KERATOSIS (18) scalp, face, ears x 18 (18) In 1 month - Start 5-fluorouracil/calcipotriene cream twice a day for 7 days to affected areas including face and scalp. Prescription sent to [facility]. Patient advised they will receive an email to purchase the medication online and have it sent to their home. Patient provided with handout reviewing treatment course and side effects and advised to call or message [place] on MyChart with any concerns.  Reviewed course of treatment and expected reaction.  Patient advised to expect inflammation and crusting and advised that erosions are possible.  Patient advised to be diligent with sun protection during and after treatment. Counseled to keep medication out of reach of children and pets.  ACTINIC DAMAGE WITH PRECANCEROUS ACTINIC KERATOSES Counseling for Topical Chemotherapy Management: Patient exhibits: - Severe, confluent actinic changes with pre-cancerous actinic keratoses that is secondary to cumulative UV radiation exposure over time - Condition that is severe; chronic, not at goal. - diffuse scaly erythematous macules and papules with underlying dyspigmentation - Discussed Prescription "Field Treatment" topical Chemotherapy for Severe, Chronic Confluent Actinic Changes with Pre-Cancerous Actinic Keratoses Field treatment involves treatment of an entire area of skin that has confluent Actinic Changes (Sun/ Ultraviolet light damage) and PreCancerous Actinic Keratoses by method of PhotoDynamic Therapy (PDT) and/or prescription Topical Chemotherapy agents such as 5-fluorouracil, 5-fluorouracil/calcipotriene, and/or imiquimod.  The purpose is to decrease the number of clinically evident and subclinical PreCancerous lesions to prevent progression to  development of skin cancer by chemically destroying early precancer changes that may or may not be visible.  It has been shown to reduce the risk of developing skin cancer in the treated area. As a result of treatment, redness, scaling, crusting, and open sores may occur during treatment course. One or more than one of these methods may be used and may have to be used several times to control, suppress and eliminate the PreCancerous changes. Discussed treatment course, expected reaction, and possible side effects. - Recommend daily broad spectrum sunscreen SPF 30+ to sun-exposed areas, reapply every 2 hours as needed.  - Staying in the shade or wearing long sleeves, sun glasses (UVA+UVB protection) and wide brim hats (4-inch brim around the entire circumference of the hat) are also recommended. - Call for new or changing lesions.  Patient history of Red / Blue PDT treatment and 5 F/U Calcipotriene field treatment cream.  Red Light PDT treatment to forehead and scalp 02/17/2023  5 F/U Calcipotriene field treatment cream on 03/23/2022, 08/20/2021, 01/22/2021, and 09/25/2020 to areas of scalp, forehead and temples   Blue Light PDT treatment to scalp on 05/30/2020  Patient has used redlight PDT treatment to forehead and scalp in [place]  Actinic keratoses are precancerous spots that appear secondary to cumulative UV radiation exposure/sun exposure over time. They are chronic with expected duration over 1 year. A portion of actinic keratoses will progress to squamous cell carcinoma of the skin. It is not possible to reliably predict which spots will progress to skin cancer and so treatment is recommended to prevent development of skin cancer.  Recommend daily broad spectrum sunscreen SPF 30+ to sun-exposed areas, reapply every 2 hours as needed.  Recommend staying  in the shade or wearing long sleeves, sun glasses (UVA+UVB protection) and wide brim hats (4-inch brim around the entire circumference of the hat). Call  for new or changing lesions. Destruction of lesion - scalp, face, ears x 18 (18) Complexity: simple   Destruction method: cryotherapy   Informed consent: discussed and consent obtained   Timeout:  patient name, date of birth, surgical site, and procedure verified Lesion destroyed using liquid nitrogen: Yes   Region frozen until ice ball extended beyond lesion: Yes   Outcome: patient tolerated procedure well with no complications   Post-procedure details: wound care instructions given   INFLAMED SEBORRHEIC KERATOSIS (4) face x 3, left ankle x 1 (4) Symptomatic, irritating, patient would like treated. Destruction of lesion - face x 3, left ankle x 1 (4) Complexity: simple   Destruction method: cryotherapy   Informed consent: discussed and consent obtained   Timeout:  patient name, date of birth, surgical site, and procedure verified Lesion destroyed using liquid nitrogen: Yes   Region frozen until ice ball extended beyond lesion: Yes   Outcome: patient tolerated procedure well with no complications   Post-procedure details: wound care instructions given   Return in about 6 months (around 12/31/2023) for ak followup.  IAsher Muir, CMA, am acting as scribe for Armida Sans, MD.   Documentation: I have reviewed the above documentation for accuracy and completeness, and I agree with the above.  Armida Sans, MD

## 2023-08-23 NOTE — Progress Notes (Signed)
 Chief Complaint  Patient presents with  . Follow-up  . AWV    HPI  Timothy Goodwin is a 89 y.o. here for a Follow up and a Medicare Wellness visit  Has been feeling well Declines further intervention for inguinal hernia at this time  Denies Chest pains or shortness of breath Stays active Appetite is ok. Non smoker No alcohol Has lost some weight  Bowels are normal. Labs done recently:Hgb :13.2 Sugar:96,BUN: 37 Se Creat :2.0, E GFR of 32, A1c; 5.7,Total cholesterol: 152, Triglycerides; 109 ,TSH: 2.487  Vit D; 38.7   and  PSA: 1.46   ROS Rest of 10 point review of systems is normal.  Outpatient Encounter Medications as of 08/23/2023  Medication Sig Dispense Refill  . allopurinoL  (ZYLOPRIM ) 100 MG tablet TAKE 1 TABLET EVERY DAY 90 tablet 3  . amLODIPine  (NORVASC ) 2.5 MG tablet TAKE 1 TABLET ONE TIME DAILY 90 tablet 3  . atorvastatin  (LIPITOR) 10 MG tablet TAKE 1 TABLET EVERY DAY 90 tablet 3  . calcium  carbonate (CORAL CALCIUM  ORAL) Take 400 mg by mouth once daily    . cloNIDine  HCL (CATAPRES ) 0.1 MG tablet TAKE 1 TABLET TWICE DAILY 180 tablet 3  . clopidogreL  (PLAVIX ) 75 mg tablet TAKE 1 TABLET ONE TIME DAILY 90 tablet 3  . ergocalciferol, vitamin D2, 1,250 mcg (50,000 unit) capsule Take 1 capsule (50,000 Units total) by mouth monthly 1 capsule 5  . FOLIC ACID/MULTIVIT-MIN/LUTEIN (CENTRUM SILVER ORAL) Take 1 tablet by mouth once daily.    . Herbal Supplement every Monday, Wednesday, and Friday Herbal Name: ISOMER E    . metoprolol  TARTrate (LOPRESSOR ) 25 MG tablet TAKE 1 TABLET TWICE DAILY 180 tablet 3  . mv-min/folic/K1/lycopen/lutein (CENTRUM SILVER MEN ORAL) Take 1 tablet by mouth once daily    . turmeric 400 mg Cap Take 1 tablet by mouth once daily    . ubidecarenone (COQ-10 ORAL) Take 1 tablet by mouth once daily    . vitamin E 200 UNIT capsule Take 200 Units by mouth once daily.    . cyanocobalamin (VITAMIN B12) 1000 MCG tablet Take 1,000 mcg by mouth once daily (Patient not  taking: Reported on 08/23/2023)     No facility-administered encounter medications on file as of 08/23/2023.    Allergies as of 08/23/2023 - Reviewed 08/23/2023  Allergen Reaction Noted  . Aspirin  Shortness Of Breath   . Lisinopril Shortness Of Breath   . Nsaids (non-steroidal anti-inflammatory drug) Swelling 03/21/2012  . Sulfa (sulfonamide antibiotics) Rash     Past Medical History:  Diagnosis Date  . Achilles tendon tear   . Acute blood loss as cause of postoperative anemia 04/05/2018  . Allergic state 2015   NSAIDS  . Anticoagulated on heparin  04/15/2018  . CAD (coronary artery disease)    stent 2000; never had an MI.  . CKD (chronic kidney disease) stage 3, GFR 30-59 ml/min (CMS/HHS-HCC)   . Elevated serum creatinine    1.6  . Gout   . Hyperlipidemia   . Hypertension   . Left inguinal hernia   . Low bicarbonate level 04/06/2018  . Myocardial infarction (CMS/HHS-HCC) 01/01/14  . Prostate cancer (CMS/HHS-HCC) 1995  . PVD (peripheral vascular disease) ()    carotid left 50% in early 2005 per his report; repeat carotid 2006 negative    Past Surgical History:  Procedure Laterality Date  . PROSTATE SURGERY  1995  . CORONARY ANGIOPLASTY  2001  . CORONARY ARTERY BYPASS W/SINGLE ARTERY GRAFT N/A 04/05/2018  Procedure: CORONARY ARTERY BYPASS, USING ARTERIAL GRAFT(S); SINGLE ARTERIAL GRAFT left internal mammary artery harvestt;  Surgeon: Markel Morene Gravely, MD;  Location: [facility];  Service: Cardiothoracic;  Laterality: N/A;  . CORONARY ARTERY BYPASS W/VEIN ONLY N/A 04/05/2018   Procedure: CORONARY ARTERY BYPASS, USING VENOUS GRAFT(S) AND ARTERIAL GRAFT(S); 2 VENOUS GRAFTS  (LIST IN ADDITION TO PRIMARY PROCEDURE);  Surgeon: Markel Morene Gravely, MD;  Location: [facility];  Service: Cardiothoracic;  Laterality: N/A;  . CORONARY ARTERY BYPASS GRAFT  04/06/18  . SMALL INTESTINE ENDOSCOPY W/CONTROL BLEEDING N/A 04/18/2018   Procedure: Small Bowel (Push)  Enteroscopy;  Surgeon: Gala Prentice Scarlet, MD;  Location: [facility];  Service: Gastroenterology;  Laterality: N/A;  . CARDIAC CATHETERIZATION    . Hx of prostatectomy    . TONSILLECTOMY      Blood pressure (!) 150/66, pulse 60, weight 66 kg (145 lb 9.6 oz), SpO2 98%.   Exam   Blood pressure (!) 150/66, pulse 60, weight 66 kg (145 lb 9.6 oz), SpO2 98%.  Wt Readings from Last 3 Encounters:  08/23/23 66 kg (145 lb 9.6 oz)  02/22/23 69.1 kg (152 lb 6.4 oz)  08/24/22 70.8 kg (156 lb)   Body mass index is 24.23 kg/m.   General. Alert oriented x3  Eyes. Sclera and conjunctiva clear; pupils equal round and reactive to light extraocular movements intact Ears. External normal; canals clear; tympanic membranes normal Nose. Mucosa healthy without drainage or ulceration Oropharynx. No suspicious lesions Neck. No swelling, masses, stiffness, pain, limited movement, carotid pulses normal bilaterally, thyroid normal size, no masses palpated.  Rt carotid bruit noted  Lungs. Respirations unlabored;Clear to auscultation  Back. No spinal deformity Cardiovascular. Heart regular rate and rhythm without murmurs, gallops, or rubs Abdomen. Soft; non tender; non distended; normoactive bowel sounds; Left inguinal hernia noted  RECTAL: Declined  Lymph Nodes. No significant cervical, supraclavicular, axillary or inguinal lymphadenopathy Extremities;  No edema   Neurologic. Alert and oriented; speech intact; face symmetrical; moves all extremities well   Assessment and Plan  1 CAD; S/p NSTEMI in Dec 2019 [facility]x of CABG at [facility]  On Atorvastatin  10 mg po qd and Plavix   Sees Dr. Huey  2 HTN:  Stable On  Metoprolol  and Norvasc  2.5 mg po qd  3 Rt Renal artery stenosis; Asymptomatic  4 Hyperuricemia; Stable -On Allopurinol  5 Hx of Prostate Ca- s/p previous surgery- No longer sees urologist. PSA is 1.46  6 CKD stage 3 - Se Creat is 2.0- eGFR of 32- Increase fluid intake Keep follow up appt with Dr.  Douglas 7 Recurrent Left inguinal hernia; Wants to hold off on surgery at this time S/p Robotic assisted hernia repair July 2022  9 Progressive Anemia; (Hgb 13.2); Hemoccult cards x 2  10 Health maintenance: Up to date with Flu shot, Pneumovax, Inj Shingrix  and COVID vaccine Colonoscopy: 2000- Declines repeat Continue to remain active. Labs 1 week prior to next visit  Follow up in 6 months      Tamra Leventhal  MD   Timothy Goodwin is a 89 y.o. here for Medicare Wellness Visit  MEDICARE WELLNESS VISIT  Providers Rendering Care 1. Dr. Tamra Leventhal  (PCP) 2 Dr. Florencio ( Cardiology)  3 Dr. Alm Rhyme ( Dermatology) 4 Dr. Douglas ( Nephrology)   Functional Assessment (1) Hearing: Demonstrates no difficulty in hearing during normal conversation (2) Risk of Falls: Patient denies any falls or near falls in the last year, Gait steady without assistance during  walk from waiting area to exam room (3) Home Safety: Patient feels secure in their home, There are operational smoke alarms in multiple areas of the home (4) Activities of Daily Living: Independently manages personal grooming and household chores, including cooking, cleaning and laundry. Manages Personal finances without assistance.  Depression Screening PHQ 2/9 last 3 flowsheet values     02/17/2022   12:10 PM 02/22/2023    2:43 PM 08/23/2023    1:09 PM  PHQ-2/9 Depression Screening   Little interest or pleasure in doing things  0 0  Feeling down, depressed, or hopeless  0 0  Patient Health Questionnaire-2 Score  0 0  (OBSOLETE) Little interest or pleasure in doing things 0    (OBSOLETE) Total Prescreening Score 0    (OBSOLETE) Total Score = 0       Depression Severity and Treatment Recommendations:  0-4= None  5-9= Mild / Treatment: Support, educate to call if worse; return in one month  10-14= Moderate / Treatment: Support, watchful waiting; Antidepressant or Psychotherapy  15-19= Moderately severe /  Treatment: Antidepressant OR Psychotherapy  >= 20 = Major depression, severe / Antidepressant AND Psychotherapy No symptoms of Depression  Situational anxiety over wife's illness   Cognitive Impairment Patient denies episodes of loosing things, being forgetful. Seems oriented to person, place and time.  Responses appear appropriate and timely to this observer.  PREVENTION PLAN  Cardiovascular: FLP assessed;08/16/23;LDL; 82  Diabetes: A1c or FBG assessed; 08/16/23; A1c; 5.7  Glaucoma: N/A Hepatitis B (HBV) Vaccine:  Not Applicable Smoking Cessation:  Not Applicable  Other Personalized Health Advice  Encouraged patient to exercise 5 days a week, walking, water aerobics, gentle stretching recommended. Increase dietary intake of fresh fruits and vegetables, reduce red meat to twice a week.  End of Life Counseling Patient has living will in place; has designated his son Marty as his POA - ; Full Code  Current Outpatient Medications  Medication Sig Dispense Refill  . allopurinoL  (ZYLOPRIM ) 100 MG tablet TAKE 1 TABLET EVERY DAY 90 tablet 3  . amLODIPine  (NORVASC ) 2.5 MG tablet TAKE 1 TABLET ONE TIME DAILY 90 tablet 3  . atorvastatin  (LIPITOR) 10 MG tablet TAKE 1 TABLET EVERY DAY 90 tablet 3  . calcium  carbonate (CORAL CALCIUM  ORAL) Take 400 mg by mouth once daily    . cloNIDine  HCL (CATAPRES ) 0.1 MG tablet TAKE 1 TABLET TWICE DAILY 180 tablet 3  . clopidogreL  (PLAVIX ) 75 mg tablet TAKE 1 TABLET ONE TIME DAILY 90 tablet 3  . ergocalciferol, vitamin D2, 1,250 mcg (50,000 unit) capsule Take 1 capsule (50,000 Units total) by mouth monthly 1 capsule 5  . FOLIC ACID/MULTIVIT-MIN/LUTEIN (CENTRUM SILVER ORAL) Take 1 tablet by mouth once daily.    . Herbal Supplement every Monday, Wednesday, and Friday Herbal Name: ISOMER E    . metoprolol  TARTrate (LOPRESSOR ) 25 MG tablet TAKE 1 TABLET TWICE DAILY 180 tablet 3  . mv-min/folic/K1/lycopen/lutein (CENTRUM SILVER MEN ORAL) Take 1 tablet by mouth once  daily    . turmeric 400 mg Cap Take 1 tablet by mouth once daily    . ubidecarenone (COQ-10 ORAL) Take 1 tablet by mouth once daily    . vitamin E 200 UNIT capsule Take 200 Units by mouth once daily.    . cyanocobalamin (VITAMIN B12) 1000 MCG tablet Take 1,000 mcg by mouth once daily (Patient not taking: Reported on 08/23/2023)     No current facility-administered medications for this visit.    Allergies as of 08/23/2023 -  Reviewed 08/23/2023  Allergen Reaction Noted  . Aspirin  Shortness Of Breath   . Lisinopril Shortness Of Breath   . Nsaids (non-steroidal anti-inflammatory drug) Swelling 03/21/2012  . Sulfa (sulfonamide antibiotics) Rash     Patient Active Problem List  Diagnosis  . PSA elevation  . Hypertension  . Hyperlipidemia  . NSTEMI, initial episode of care (CMS/HHS-HCC)  . Cancer of prostate (CMS/HHS-HCC)  . CKD (chronic kidney disease) stage 3, GFR 30-59 ml/min (CMS-HCC)  . 3-vessel coronary artery disease  . Gout  . Atrial fibrillation (CMS/HHS-HCC)  . S/P CABG x 2  . Duodenal ulcer  . Elevated blood sugar level, unspecified    Past Medical History:  Diagnosis Date  . Achilles tendon tear   . Acute blood loss as cause of postoperative anemia 04/05/2018  . Allergic state 2015   NSAIDS  . Anticoagulated on heparin  04/15/2018  . CAD (coronary artery disease)    stent 2000; never had an MI.  . CKD (chronic kidney disease) stage 3, GFR 30-59 ml/min (CMS/HHS-HCC)   . Elevated serum creatinine    1.6  . Gout   . Hyperlipidemia   . Hypertension   . Left inguinal hernia   . Low bicarbonate level 04/06/2018  . Myocardial infarction (CMS/HHS-HCC) 01/01/14  . Prostate cancer (CMS/HHS-HCC) 1995  . PVD (peripheral vascular disease) ()    carotid left 50% in early 2005 per his report; repeat carotid 2006 negative    Past Surgical History:  Procedure Laterality Date  . PROSTATE SURGERY  1995  . CORONARY ANGIOPLASTY  2001  . CORONARY ARTERY BYPASS W/SINGLE ARTERY  GRAFT N/A 04/05/2018   Procedure: CORONARY ARTERY BYPASS, USING ARTERIAL GRAFT(S); SINGLE ARTERIAL GRAFT left internal mammary artery harvestt;  Surgeon: Markel Morene Gravely, MD;  Location: [facility];  Service: Cardiothoracic;  Laterality: N/A;  . CORONARY ARTERY BYPASS W/VEIN ONLY N/A 04/05/2018   Procedure: CORONARY ARTERY BYPASS, USING VENOUS GRAFT(S) AND ARTERIAL GRAFT(S); 2 VENOUS GRAFTS  (LIST IN ADDITION TO PRIMARY PROCEDURE);  Surgeon: Markel Morene Gravely, MD;  Location: [facility];  Service: Cardiothoracic;  Laterality: N/A;  . CORONARY ARTERY BYPASS GRAFT  04/06/18  . SMALL INTESTINE ENDOSCOPY W/CONTROL BLEEDING N/A 04/18/2018   Procedure: Small Bowel (Push) Enteroscopy;  Surgeon: Gala Prentice Scarlet, MD;  Location: [facility];  Service: Gastroenterology;  Laterality: N/A;  . CARDIAC CATHETERIZATION    . Hx of prostatectomy    . TONSILLECTOMY      Health Maintenance  Topic Date Due  . COVID-19 Vaccine (5 - 2024-25 season) 12/20/2022  . Serum Phosphorus  06/05/2023  . Parathyroid Hormone  06/05/2023  . Medicare Subsequent AWV [id number]  08/25/2023  . Annual Physical/Well Child Check  02/23/2024  . Creatinine Level  08/15/2024  . Potassium Level  08/15/2024  . Lipid Panel  08/15/2024  . Annual Urine Albumin Creatinine Ratio  08/15/2024  . Serum Bicarbonate  08/15/2024  . Serum Calcium   08/15/2024  . Depression Screening  08/22/2024  . Adult Tetanus (Td And Tdap)  12/16/2025  . Diabetes Screening  08/16/2026  . Pneumococcal Vaccine: 50+  Completed  . Shingrix  Completed  . RSV Immunization Pregnant or 60+  Completed  . Influenza Vaccine  Completed  . Hib Vaccines  Aged Out  . Hepatitis A Vaccines  Aged Out  . Meningococcal B Vaccine  Aged Out  . Meningococcal ACWY Vaccine  Aged Out  . HPV Vaccines  Aged Out    Vitals:   08/23/23 1311  BP: (!) 150/66  Pulse: 60  SpO2: 98%  Weight: 66 kg (145 lb 9.6 oz)  PainSc: 0-No pain   Body mass  index is 24.23 kg/m.  Assessment/Plan  1. Medicare wellness visit- Medications and allergies reviewed. Copy of preventative health provided.  Labs reviewed.   TAMRA ETHELENE LEVENTHAL, MD

## 2023-11-13 ENCOUNTER — Observation Stay

## 2023-11-13 ENCOUNTER — Observation Stay
Admission: EM | Admit: 2023-11-13 | Discharge: 2023-11-14 | Disposition: A | Discharge status: Home / Self Care | Attending: Emergency Medicine | Admitting: Emergency Medicine

## 2023-11-13 ENCOUNTER — Observation Stay: Admit: 2023-11-13 | Discharge: 2023-11-13 | Disposition: A | Discharge status: Home / Self Care | Attending: Student

## 2023-11-13 ENCOUNTER — Other Ambulatory Visit: Payer: Self-pay

## 2023-11-13 ENCOUNTER — Emergency Department

## 2023-11-13 DIAGNOSIS — E785 Hyperlipidemia, unspecified: Secondary | ICD-10-CM | POA: Diagnosis not present

## 2023-11-13 DIAGNOSIS — I2510 Atherosclerotic heart disease of native coronary artery without angina pectoris: Secondary | ICD-10-CM | POA: Diagnosis present

## 2023-11-13 DIAGNOSIS — Z7982 Long term (current) use of aspirin: Secondary | ICD-10-CM | POA: Diagnosis not present

## 2023-11-13 DIAGNOSIS — Z79899 Other long term (current) drug therapy: Secondary | ICD-10-CM | POA: Insufficient documentation

## 2023-11-13 DIAGNOSIS — Z8546 Personal history of malignant neoplasm of prostate: Secondary | ICD-10-CM | POA: Insufficient documentation

## 2023-11-13 DIAGNOSIS — I480 Paroxysmal atrial fibrillation: Secondary | ICD-10-CM | POA: Insufficient documentation

## 2023-11-13 DIAGNOSIS — R42 Dizziness and giddiness: Secondary | ICD-10-CM | POA: Diagnosis present

## 2023-11-13 DIAGNOSIS — I700 Atherosclerosis of aorta: Secondary | ICD-10-CM | POA: Insufficient documentation

## 2023-11-13 DIAGNOSIS — Z951 Presence of aortocoronary bypass graft: Secondary | ICD-10-CM | POA: Diagnosis not present

## 2023-11-13 DIAGNOSIS — Z7902 Long term (current) use of antithrombotics/antiplatelets: Secondary | ICD-10-CM | POA: Insufficient documentation

## 2023-11-13 DIAGNOSIS — R55 Syncope and collapse: Secondary | ICD-10-CM | POA: Diagnosis not present

## 2023-11-13 DIAGNOSIS — N1832 Chronic kidney disease, stage 3b: Secondary | ICD-10-CM | POA: Insufficient documentation

## 2023-11-13 DIAGNOSIS — I129 Hypertensive chronic kidney disease with stage 1 through stage 4 chronic kidney disease, or unspecified chronic kidney disease: Secondary | ICD-10-CM | POA: Insufficient documentation

## 2023-11-13 DIAGNOSIS — I4891 Unspecified atrial fibrillation: Secondary | ICD-10-CM | POA: Diagnosis present

## 2023-11-13 LAB — CBC
HCT: 40.1 % (ref 39.0–52.0)
Hemoglobin: 13.3 g/dL (ref 13.0–17.0)
MCH: 31.7 pg (ref 26.0–34.0)
MCHC: 33.2 g/dL (ref 30.0–36.0)
MCV: 95.5 fL (ref 80.0–100.0)
Platelets: 179 K/uL (ref 150–400)
RBC: 4.2 MIL/uL — ABNORMAL LOW (ref 4.22–5.81)
RDW: 13.2 % (ref 11.5–15.5)
WBC: 5.9 K/uL (ref 4.0–10.5)
nRBC: 0 % (ref 0.0–0.2)

## 2023-11-13 LAB — COMPREHENSIVE METABOLIC PANEL WITH GFR
ALT: 21 U/L (ref 0–44)
AST: 24 U/L (ref 15–41)
Albumin: 3.7 g/dL (ref 3.5–5.0)
Alkaline Phosphatase: 56 U/L (ref 38–126)
Anion gap: 13 (ref 5–15)
BUN: 36 mg/dL — ABNORMAL HIGH (ref 8–23)
CO2: 21 mmol/L — ABNORMAL LOW (ref 22–32)
Calcium: 9.1 mg/dL (ref 8.9–10.3)
Chloride: 108 mmol/L (ref 98–111)
Creatinine, Ser: 1.88 mg/dL — ABNORMAL HIGH (ref 0.61–1.24)
GFR, Estimated: 34 mL/min — ABNORMAL LOW (ref >60)
Glucose, Bld: 160 mg/dL — ABNORMAL HIGH (ref 70–99)
Potassium: 4 mmol/L (ref 3.5–5.1)
Sodium: 142 mmol/L (ref 135–145)
Total Bilirubin: 1.1 mg/dL (ref 0.0–1.2)
Total Protein: 6.2 g/dL — ABNORMAL LOW (ref 6.5–8.1)

## 2023-11-13 LAB — URINALYSIS, ROUTINE W REFLEX MICROSCOPIC
Bacteria, UA: NONE SEEN
Bilirubin Urine: NEGATIVE
Glucose, UA: NEGATIVE mg/dL
Hgb urine dipstick: NEGATIVE
Ketones, ur: NEGATIVE mg/dL
Leukocytes,Ua: NEGATIVE
Nitrite: NEGATIVE
Protein, ur: 30 mg/dL — AB
RBC / HPF: 0 RBC/hpf (ref 0–5)
Specific Gravity, Urine: 1.009 (ref 1.005–1.030)
Squamous Epithelial / HPF: 0 /HPF (ref 0–5)
pH: 6 (ref 5.0–8.0)

## 2023-11-13 LAB — TROPONIN I (HIGH SENSITIVITY): Troponin I (High Sensitivity): 10 ng/L (ref <18)

## 2023-11-13 LAB — PROTIME-INR
INR: 1.1 (ref 0.8–1.2)
Prothrombin Time: 14.8 s (ref 11.4–15.2)

## 2023-11-13 LAB — APTT: aPTT: 28 s (ref 24–36)

## 2023-11-13 LAB — HEPARIN LEVEL (UNFRACTIONATED): Heparin Unfractionated: 0.37 [IU]/mL (ref 0.30–0.70)

## 2023-11-13 LAB — CBG MONITORING, ED: Glucose-Capillary: 153 mg/dL — ABNORMAL HIGH (ref 70–99)

## 2023-11-13 MED ORDER — ONDANSETRON HCL 4 MG/2ML IJ SOLN: 4.0000 mg | Freq: Four times a day (QID) | INTRAMUSCULAR | Status: DC | PRN

## 2023-11-13 MED ORDER — HEPARIN SODIUM (PORCINE) 5000 UNIT/ML IJ SOLN: 5000.0000 [IU] | Freq: Two times a day (BID) | INTRAMUSCULAR | Status: DC

## 2023-11-13 MED ORDER — ALLOPURINOL 100 MG PO TABS
100.0000 mg | ORAL_TABLET | Freq: Every day | ORAL | Status: DC
Start: 2023-11-13 — End: 2023-11-14
  Administered 2023-11-13 – 2023-11-14 (×2): 100 mg via ORAL
  Filled 2023-11-13 (×3): qty 1

## 2023-11-13 MED ORDER — SODIUM CHLORIDE 0.9% FLUSH
3.0000 mL | Freq: Two times a day (BID) | INTRAVENOUS | Status: DC
  Administered 2023-11-13 – 2023-11-14 (×2): 3 mL via INTRAVENOUS

## 2023-11-13 MED ORDER — HEPARIN (PORCINE) 25000 UT/250ML-% IV SOLN
1000.0000 [IU]/h | INTRAVENOUS | Status: DC
  Administered 2023-11-13: 1000 [IU]/h via INTRAVENOUS
  Filled 2023-11-13: qty 250

## 2023-11-13 MED ORDER — AMLODIPINE BESYLATE 5 MG PO TABS
2.5000 mg | ORAL_TABLET | ORAL | Status: AC
  Administered 2023-11-13: 2.5 mg via ORAL
  Filled 2023-11-13: qty 1

## 2023-11-13 MED ORDER — ATORVASTATIN CALCIUM 10 MG PO TABS
10.0000 mg | ORAL_TABLET | Freq: Every day | ORAL | Status: DC
  Administered 2023-11-13 – 2023-11-14 (×2): 10 mg via ORAL
  Filled 2023-11-13 (×2): qty 1

## 2023-11-13 MED ORDER — METOPROLOL TARTRATE 25 MG PO TABS
37.5000 mg | ORAL_TABLET | Freq: Two times a day (BID) | ORAL | Status: DC
  Administered 2023-11-13 – 2023-11-14 (×2): 37.5 mg via ORAL
  Filled 2023-11-13 (×2): qty 2

## 2023-11-13 MED ORDER — HEPARIN BOLUS VIA INFUSION
4000.0000 [IU] | Freq: Once | INTRAVENOUS | Status: AC
  Administered 2023-11-13: 4000 [IU] via INTRAVENOUS
  Filled 2023-11-13: qty 4000

## 2023-11-13 MED ORDER — CLOPIDOGREL BISULFATE 75 MG PO TABS
75.0000 mg | ORAL_TABLET | Freq: Every day | ORAL | Status: DC
  Administered 2023-11-13 – 2023-11-14 (×2): 75 mg via ORAL
  Filled 2023-11-13 (×2): qty 1

## 2023-11-13 MED ORDER — ONDANSETRON HCL 4 MG PO TABS
4.0000 mg | ORAL_TABLET | Freq: Four times a day (QID) | ORAL | Status: DC | PRN
Start: 2023-11-13 — End: 2023-11-14

## 2023-11-13 MED ORDER — METOPROLOL TARTRATE 25 MG PO TABS
25.0000 mg | ORAL_TABLET | ORAL | Status: AC
  Administered 2023-11-13: 25 mg via ORAL
  Filled 2023-11-13: qty 1

## 2023-11-13 MED ORDER — METOPROLOL TARTRATE 5 MG/5ML IV SOLN: 2.5000 mg | INTRAVENOUS | Status: DC | PRN

## 2023-11-13 NOTE — Plan of Care (Signed)
  Problem: Clinical Measurements: Goal: Ability to maintain clinical measurements within normal limits will improve Outcome: Progressing   Problem: Clinical Measurements: Goal: Cardiovascular complication will be avoided Outcome: Progressing   Problem: Activity: Goal: Risk for activity intolerance will decrease Outcome: Progressing   Problem: Pain Managment: Goal: General experience of comfort will improve and/or be controlled Outcome: Progressing   Problem: Safety: Goal: Ability to remain free from injury will improve Outcome: Progressing

## 2023-11-13 NOTE — ED Notes (Addendum)
 Called lab to add on labs from previous collection sent upon arrival. Lab to add on.

## 2023-11-13 NOTE — Consult Note (Signed)
 PHARMACY - ANTICOAGULATION CONSULT NOTE  Pharmacy Consult for heparin  infusion Indication: atrial fibrillation  Allergies  Allergen Reactions   Aspirin  Shortness Of Breath   Lisinopril Shortness Of Breath   Ace Inhibitors Other (See Comments)   Nsaids Swelling   Sulfa Antibiotics Swelling and Rash    Patient Measurements: Height: 5' 5 (165.1 cm) Weight: 71.7 kg (158 lb 1.1 oz) IBW/kg (Calculated) : 61.5 HEPARIN  DW (KG): 71.7  Vital Signs: Temp: 97.6 F (36.4 C) (07/26 0931) Temp Source: Oral (07/26 0931) BP: 142/79 (07/26 1030) Pulse Rate: 136 (07/26 1045)  Labs: Recent Labs    11/13/23 0855  HGB 13.3  HCT 40.1  PLT 179  CREATININE 1.88*    Estimated Creatinine Clearance: 24.1 mL/min (A) (by C-G formula based on SCr of 1.88 mg/dL (H)).   Medical History: Past Medical History:  Diagnosis Date   Actinic keratosis    Basal cell carcinoma 11/02/2019   upper back spinal    Carotid artery stenosis    CKD (chronic kidney disease), stage III (HCC)    Coronary artery disease    Gout    Hx of CABG 04/05/2018   2v; LIMA-LAD, SVG-PL   Hyperlipidemia    Hypertension    Left inguinal hernia    NSTEMI (non-ST elevated myocardial infarction) (HCC) 12/25/2013   NSTEMI (non-ST elevated myocardial infarction) (HCC) 03/30/2018   Peripheral vascular disease (HCC)    Postoperative atrial fibrillation (HCC)    Prostate cancer (HCC) 1995   Right renal artery stenosis (HCC)     Medications:  No AC prior to admission  Assessment: Patient with PMH relevant for CAD s/p two-vessel CABG 2019, HTN, HLD, CKD stage IIIb, prostate cancer, presented to ED with syncope and new onset Afib.  Pharmacy consulted to initiate and manage heparin  infusion. Noted CrCl < 30. Hgb and plt appropriate to initiate therapy and  No baseline INR/aPTT ordered by MD.  Will order baseline labs per protocol.  Goal of Therapy:  Heparin  level 0.3-0.7 units/ml Monitor platelets by anticoagulation  protocol: Yes   Plan:  Give 4000 units bolus x 1 Start heparin  infusion at 1000 units/hr Check heparin  level in 8 hours and daily while on heparin  Continue to monitor H&H and platelets  Jerred Zaremba Rodriguez-Guzman PharmD, BCPS 11/13/2023 11:51 AM

## 2023-11-13 NOTE — Consult Note (Signed)
 PHARMACY - ANTICOAGULATION CONSULT NOTE  Pharmacy Consult for heparin  infusion Indication: atrial fibrillation  Allergies  Allergen Reactions   Aspirin  Shortness Of Breath   Lisinopril Shortness Of Breath   Ace Inhibitors Other (See Comments)   Nsaids Swelling   Sulfa Antibiotics Swelling and Rash   Patient Measurements: Height: 5' 5 (165.1 cm) Weight: 71.7 kg (158 lb 1.1 oz) IBW/kg (Calculated) : 61.5 HEPARIN  DW (KG): 71.7  Vital Signs: Temp: 98.2 F (36.8 C) (07/26 2006) Temp Source: Oral (07/26 2006) BP: 158/81 (07/26 2006) Pulse Rate: 95 (07/26 2006)  Labs: Recent Labs    11/13/23 0855 11/13/23 0910 11/13/23 2147  HGB 13.3  --   --   HCT 40.1  --   --   PLT 179  --   --   APTT  --  28  --   LABPROT  --  14.8  --   INR  --  1.1  --   HEPARINUNFRC  --   --  0.37  CREATININE 1.88*  --   --   TROPONINIHS  --  10  --    Estimated Creatinine Clearance: 24.1 mL/min (A) (by C-G formula based on SCr of 1.88 mg/dL (H)).  Medical History: Past Medical History:  Diagnosis Date   Actinic keratosis    Basal cell carcinoma 11/02/2019   upper back spinal    Carotid artery stenosis    CKD (chronic kidney disease), stage III (HCC)    Coronary artery disease    Gout    Hx of CABG 04/05/2018   2v; LIMA-LAD, SVG-PL   Hyperlipidemia    Hypertension    Left inguinal hernia    NSTEMI (non-ST elevated myocardial infarction) (HCC) 12/25/2013   NSTEMI (non-ST elevated myocardial infarction) (HCC) 03/30/2018   Peripheral vascular disease (HCC)    Postoperative atrial fibrillation (HCC)    Prostate cancer (HCC) 1995   Right renal artery stenosis (HCC)    Medications:  No AC prior to admission  Assessment: Patient with PMH relevant for CAD s/p two-vessel CABG 2019, HTN, HLD, CKD stage IIIb, prostate cancer, presented to ED with syncope and new onset Afib.  Pharmacy consulted to initiate and manage heparin  infusion. Noted CrCl < 30. Hgb and plt appropriate to initiate  therapy and  No baseline INR/aPTT ordered by MD.  Will order baseline labs per protocol.  Goal of Therapy:  Heparin  level 0.3-0.7 units/ml Monitor platelets by anticoagulation protocol: Yes  Labs:  0726 2147  HL 0.37 - therapeutic    Plan:  HL therapeutic x 1  Continue heparin  infusion at 1000 units/hr Recheck heparin  level in 8 hours to confirm rate  Continue to monitor H&H and platelets  Evonnie Nieves, PharmD Clinical Pharmacist  11/13/2023 10:17 PM
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
## 2023-11-13 NOTE — ED Triage Notes (Signed)
 Pt from [facility]. Pt was doing some gardening where he got dizzy and became altered. Pt is on blood thinners. Pt has hx of heart attack, afib, and HTN. Pt states he's currently dizzy laying in bed.   Pt recieved 500cc of LR and 4mg  of zofran   Bp-180/90 pressure with fire dept. 93% on RA pt placed on 2L Irregular HR  BS-144

## 2023-11-13 NOTE — Progress Notes (Signed)
  Echocardiogram 2D Echocardiogram has been performed.  Timothy Goodwin 11/13/2023, 4:04 PM

## 2023-11-13 NOTE — ED Notes (Signed)
 Pt requesting something to eat. MD made aware. Per MD okay for pt to have food. Sandwich tray given along with water and sprite.

## 2023-11-13 NOTE — Evaluation (Signed)
 Physical Therapy Evaluation Patient Details Name: Timothy Goodwin MRN: [id number] DOB: 03/01/1935 Today's Date: 11/13/2023  History of Present Illness  Pt admitted to [facility] on 11/13/23 for c/o lightheadedness and syncope. Significant PMH includes: CAD s/p two-vessel CABG 2019, HTN, HLD, CKD stage IIIb, prostate cancer  Clinical Impression  Pt received in supine and is agreeable for participation with PT eval. At baseline, pt is completely IND with ADL's, IADL's, ambulation without AD, driving, medication management, yard work, and working out.   Pt presents with asymptomatic orthostatic hypotension (+10 pt DBP drop from standing to ), that has otherwise not negatively affected his balance, strength, endurance, and overall mobility. He is grossly IND for bed mobility, transfers, and gait in room, and confirms that his mobility is at baseline.   Due to pt not having acute deficits as a result of this hospital admission, he is not appropriate for skilled acute PT services. PT to sign off. No post acute therapy or DME needs identified at DC.  Encourage OOB mobility with nursing and mobility tech for meals and toileting for continued progress towards goals and maintenance of IND with functional mobility while hospitalized.     11/13/23 1618  Orthostatic Lying   BP- Lying 128/79  Pulse- Lying 96  Orthostatic Sitting  BP- Sitting (!) 154/97  Pulse- Sitting 74  Orthostatic Standing at 0 minutes  BP- Standing at 0 minutes (!) 149/102  Pulse- Standing at 0 minutes 74  Orthostatic Standing at 3 minutes  BP- Standing at 3 minutes 157/87  Pulse- Standing at 3 minutes 64          Can travel by private vehicle    Yes    Equipment Recommendations None recommended by PT     Functional Status Assessment Patient has not had a recent decline in their functional status     Precautions / Restrictions Precautions Precautions: None      Mobility  Bed Mobility Overal bed mobility:  Independent                  Transfers Overall transfer level: Independent                      Ambulation/Gait Ambulation/Gait assistance: Independent Gait Distance (Feet): 10 Feet           General Gait Details: notes mobility is at baseline    Balance Overall balance assessment: Independent                                           Pertinent Vitals/Pain Pain Assessment Pain Assessment: No/denies pain    Home Living Family/patient expects to be discharged to:: Private residence [facility]) Living Arrangements: Alone Available Help at Discharge: Available PRN/intermittently (daughter lives in [place]) Type of Home: House Home Access: Level entry       Home Layout: One level Home Equipment: Hand held shower head;Grab bars - tub/shower;Grab bars - toilet      Prior Function Prior Level of Function : Independent/Modified Independent             Mobility Comments: IND with ambulation without AD; goes to the fitness center for walking; does yard work outside ADLs Comments: IND with ADL's, IADL's, medication management, and driving     Extremity/Trunk Assessment   Upper Extremity Assessment Upper Extremity Assessment: Overall WFL for tasks assessed  Lower Extremity Assessment Lower Extremity Assessment: Overall WFL for tasks assessed       Communication   Communication Communication: No apparent difficulties    Cognition Arousal: Alert Behavior During Therapy: WFL for tasks assessed/performed   PT - Cognitive impairments: No apparent impairments                         Following commands: Intact       Cueing Cueing Techniques: Verbal cues     General Comments General comments (skin integrity, edema, etc.): skin discoloration to BUE likely from blood thinners; asymptomatic with orthostatic testing    Exercises Other Exercises Other Exercises: Pt educated re: PT role/POC, DC recommendations,  orthostatics, safety with mobility, OOB for meals and toileting, call for help.   Assessment/Plan    PT Assessment Patient does not need any further PT services         PT Goals (Current goals can be found in the Care Plan section)  Acute Rehab PT Goals Patient Stated Goal: go home PT Goal Formulation: With patient Time For Goal Achievement: 11/27/23 Potential to Achieve Goals: Good    Frequency  One time visit     AM-PAC PT 6 Clicks Mobility  Outcome Measure Help needed turning from your back to your side while in a flat bed without using bedrails?: None Help needed moving from lying on your back to sitting on the side of a flat bed without using bedrails?: None Help needed moving to and from a bed to a chair (including a wheelchair)?: None Help needed standing up from a chair using your arms (e.g., wheelchair or bedside chair)?: None Help needed to walk in hospital room?: None Help needed climbing 3-5 steps with a railing? : None 6 Click Score: 24    End of Session Equipment Utilized During Treatment: Gait belt Activity Tolerance: Patient tolerated treatment well Patient left: in bed (left in care of staff for echo) Nurse Communication: Mobility status      Time: 8486-8465 PT Time Calculation (min) (ACUTE ONLY): 21 min   Charges:   PT Evaluation $PT Eval Low Complexity: 1 Low   PT General Charges $$ ACUTE PT VISIT: 1 Visit        Camie CHARLENA Kluver, PT, DPT 4:25 PM,11/13/23 Physical Therapist - [facility]

## 2023-11-13 NOTE — ED Provider Notes (Signed)
 [facility] Provider Note    Event Date/Time   First MD Initiated Contact with Patient 11/13/23 [phone]     (approximate)   History   Dizziness   HPI  Timothy Goodwin is a 89 y.o. male coronary artery disease, paroxysmal atrial fibrillation   Patient relates he was in normal health.  Got up this morning and went out to the garden, he went up there about 45 minutes prior to becoming dizzy.  He was out weeding when he suddenly started feeling dizzy and then had to lay down.  He also reports he does not remember a little bit of it.  Someone else was in the garden found him.  He thinks it is possible he may have fainted.  He has no pain anywhere no chest discomfort no abdominal pain no difficulty breathing.  No headache.  No numbness or weakness.  Reports he is not aware a history of heart problems of atrial fibrillation.  Reviewed primary care and historical notes.  He does have a paroxysmal history of A-fib but his last PCP note does not denote active A-fib as a known medical problem and does not appear to be on any anticoagulant.  Physical Exam   Triage Vital Signs: ED Triage Vitals  Encounter Vitals Group     BP      Girls Systolic BP Percentile      Girls Diastolic BP Percentile      Boys Systolic BP Percentile      Boys Diastolic BP Percentile      Pulse      Resp      Temp      Temp src      SpO2      Weight      Height      Head Circumference      Peak Flow      Pain Score      Pain Loc      Pain Education      Exclude from Growth Chart     Most recent vital signs: Vitals:   11/13/23 1030 11/13/23 1045  BP: (!) 142/79   Pulse: 66 (!) 136  Resp: 15 17  Temp:    SpO2: 100% 99%     General: Awake, no distress.  Small amount of particles over his cheek. CV:  Good peripheral perfusion.  Slightly irregular minimally tachycardic Resp:  Normal effort.  Clear bilateral normal work of breathing Abd:  No distention.  Soft nontender  nondistended except he does report sensation to need to urinate with palpation suprapubic region Other:  Moves all extremities well demonstrates normal use of the lower extremities hips elbows shoulders.  Clear speech.  Smiles equally.  Fully alert oriented.  No focal deficits   ED Results / Procedures / Treatments   Labs (all labs ordered are listed, but only abnormal results are displayed) Labs Reviewed  COMPREHENSIVE METABOLIC PANEL WITH GFR - Abnormal; Notable for the following components:      Result Value   CO2 21 (*)    Glucose, Bld 160 (*)    BUN 36 (*)    Creatinine, Ser 1.88 (*)    Total Protein 6.2 (*)    GFR, Estimated 34 (*)    All other components within normal limits  CBC - Abnormal; Notable for the following components:   RBC 4.20 (*)    All other components within normal limits  URINALYSIS, ROUTINE W REFLEX MICROSCOPIC - Abnormal; Notable  for the following components:   Color, Urine STRAW (*)    APPearance CLEAR (*)    Protein, ur 30 (*)    All other components within normal limits  CBG MONITORING, ED - Abnormal; Notable for the following components:   Glucose-Capillary 153 (*)    All other components within normal limits  CBG MONITORING, ED     EKG  Interpreted by me at 9 AM heart rate 90 QRS 110 QTc 430 Atrial fibrillation, mild nonspecific T wave abnormality.  No evidence of frank ischemia or STEMI.  No associated chest pain   RADIOLOGY  CT HEAD WO CONTRAST Result Date: 11/13/2023 EXAM: CT HEAD WITHOUT CONTRAST 11/13/2023 09:29:23 AM TECHNIQUE: CT of the head was performed without the administration of intravenous contrast. Automated exposure control, iterative reconstruction, and/or weight based adjustment of the mA/kV was utilized to reduce the radiation dose to as low as reasonably achievable. COMPARISON: None available. CLINICAL HISTORY: Mental status change, unknown cause. Patient was doing some gardening where he got dizzy and became altered.  Patient is on blood thinners. Patient has history of heart attack, atrial fibrillation, and hypertension. Patient states he's currently dizzy laying in bed. FINDINGS: BRAIN AND VENTRICLES: No acute hemorrhage. Gray-white differentiation is preserved. No hydrocephalus. No extra-axial collection. No mass effect or midline shift. Mild periventricular and subcortical white matter hypoattenuation is within normal limits for age. ORBITS: No acute abnormality. SINUSES: No acute abnormality. SOFT TISSUES AND SKULL: No acute soft tissue abnormality. No skull fracture. VASCULATURE: Atherosclerotic calcifications are present in the cavernous carotid arteries bilaterally and at the dural margin of both vertebral arteries. No hyperdense vessel is present. IMPRESSION: 1. No acute intracranial abnormality related to the mental status change. 2. Atherosclerosis. Electronically signed by: Lonni Necessary MD 11/13/2023 09:49 AM EDT RP Workstation: [id number]      PROCEDURES:  Critical Care performed: No  Procedures   MEDICATIONS ORDERED IN ED: Medications  amLODipine  (NORVASC ) tablet 2.5 mg (2.5 mg Oral Given 11/13/23 0930)  metoprolol  tartrate (LOPRESSOR ) tablet 25 mg (25 mg Oral Given 11/13/23 0930)     IMPRESSION / MDM / ASSESSMENT AND PLAN / ED COURSE  I reviewed the triage vital signs and the nursing notes.                              Differential diagnosis includes, but is not limited to, possible cardiac dysrhythmia fainting episode syncope, vasovagal, dehydration, anemia, etc.  Based on the patient's noted history of paroxysmal A-fib but not necessarily carried through from recent evaluation with PCP I suspect he may have had a paroxysmal for A-fib at this point which could have contributed to syncopal episode.  He is normocephalic atraumatic no cervical tenderness he is awake alert he is well-oriented and very pleasant.  Will evaluate broadly as to cause no obvious central neurologic  features.  Patient's presentation is most consistent with acute complicated illness / injury requiring diagnostic workup.   The patient is on the cardiac monitor to evaluate for evidence of arrhythmia and/or significant heart rate changes.   Labs reviewed largely unremarkable for acute abnormality or change would lead to his syncopal episode.  He is awake alert oriented, daughter now at the bedside at 10:30.  Spoke with the patient and I also consulted with Dr. Laurita of internal medicine who is agreeable to admission and further observation/workup due to the syncope with associated atrial fibrillation.     FINAL CLINICAL  IMPRESSION(S) / ED DIAGNOSES   Final diagnoses:  Syncope and collapse  Atrial fibrillation, rapid (HCC)     Rx / DC Orders   ED Discharge Orders     None        Note:  This document was prepared using Dragon voice recognition software and may include unintentional dictation errors.   Dicky Anes, MD 11/13/23 1049

## 2023-11-14 DIAGNOSIS — I480 Paroxysmal atrial fibrillation: Secondary | ICD-10-CM

## 2023-11-14 DIAGNOSIS — R55 Syncope and collapse: Secondary | ICD-10-CM | POA: Diagnosis not present

## 2023-11-14 DIAGNOSIS — I2510 Atherosclerotic heart disease of native coronary artery without angina pectoris: Secondary | ICD-10-CM

## 2023-11-14 LAB — CBC
HCT: 43.2 % (ref 39.0–52.0)
Hemoglobin: 14.5 g/dL (ref 13.0–17.0)
MCH: 32.1 pg (ref 26.0–34.0)
MCHC: 33.6 g/dL (ref 30.0–36.0)
MCV: 95.6 fL (ref 80.0–100.0)
Platelets: 219 K/uL (ref 150–400)
RBC: 4.52 MIL/uL (ref 4.22–5.81)
RDW: 13.2 % (ref 11.5–15.5)
WBC: 8.1 K/uL (ref 4.0–10.5)
nRBC: 0 % (ref 0.0–0.2)

## 2023-11-14 LAB — ECHOCARDIOGRAM COMPLETE
AR max vel: 1.47 cm2
AV Peak grad: 6.1 mmHg
Ao pk vel: 1.24 m/s
Area-P 1/2: 4.08 cm2
Height: 65 in
S' Lateral: 3.6 cm
Weight: 2529.12 [oz_av]

## 2023-11-14 LAB — GLUCOSE, CAPILLARY: Glucose-Capillary: 104 mg/dL — ABNORMAL HIGH (ref 70–99)

## 2023-11-14 LAB — HEPARIN LEVEL (UNFRACTIONATED): Heparin Unfractionated: 0.63 [IU]/mL (ref 0.30–0.70)

## 2023-11-14 MED ORDER — METOPROLOL TARTRATE 37.5 MG PO TABS: 37.5000 mg | ORAL_TABLET | Freq: Two times a day (BID) | ORAL | 0 refills | Status: DC

## 2023-11-14 MED ORDER — APIXABAN 2.5 MG PO TABS
2.5000 mg | ORAL_TABLET | Freq: Two times a day (BID) | ORAL | Status: DC
  Administered 2023-11-14: 2.5 mg via ORAL
  Filled 2023-11-14: qty 1

## 2023-11-14 MED ORDER — APIXABAN 2.5 MG PO TABS: 2.5000 mg | ORAL_TABLET | Freq: Two times a day (BID) | ORAL | 0 refills | Status: AC

## 2023-11-14 NOTE — Consult Note (Signed)
 PHARMACY - ANTICOAGULATION CONSULT NOTE  Pharmacy Consult for heparin  infusion Indication: atrial fibrillation  Allergies  Allergen Reactions   Aspirin  Shortness Of Breath   Lisinopril Shortness Of Breath   Ace Inhibitors Other (See Comments)   Nsaids Swelling   Sulfa Antibiotics Swelling and Rash   Patient Measurements: Height: 5' 5 (165.1 cm) Weight: 65.5 kg (144 lb 6.4 oz) IBW/kg (Calculated) : 61.5 HEPARIN  DW (KG): 71.7  Vital Signs: Temp: 98 F (36.7 C) (07/27 0250) Temp Source: Oral (07/26 2006) BP: 136/94 (07/27 0250) Pulse Rate: 79 (07/27 0250)  Labs: Recent Labs    11/13/23 0855 11/13/23 0910 11/13/23 2147 11/14/23 0608  HGB 13.3  --   --  14.5  HCT 40.1  --   --  43.2  PLT 179  --   --  219  APTT  --  28  --   --   LABPROT  --  14.8  --   --   INR  --  1.1  --   --   HEPARINUNFRC  --   --  0.37 0.63  CREATININE 1.88*  --   --   --   TROPONINIHS  --  10  --   --    Estimated Creatinine Clearance: 24.1 mL/min (A) (by C-G formula based on SCr of 1.88 mg/dL (H)).  Medical History: Past Medical History:  Diagnosis Date   Actinic keratosis    Basal cell carcinoma 11/02/2019   upper back spinal    Carotid artery stenosis    CKD (chronic kidney disease), stage III (HCC)    Coronary artery disease    Gout    Hx of CABG 04/05/2018   2v; LIMA-LAD, SVG-PL   Hyperlipidemia    Hypertension    Left inguinal hernia    NSTEMI (non-ST elevated myocardial infarction) (HCC) 12/25/2013   NSTEMI (non-ST elevated myocardial infarction) (HCC) 03/30/2018   Peripheral vascular disease (HCC)    Postoperative atrial fibrillation (HCC)    Prostate cancer (HCC) 1995   Right renal artery stenosis (HCC)    Medications:  No AC prior to admission  Assessment: Patient with PMH relevant for CAD s/p two-vessel CABG 2019, HTN, HLD, CKD stage IIIb, prostate cancer, presented to ED with syncope and new onset Afib.  Pharmacy consulted to initiate and manage heparin   infusion. Noted CrCl < 30. Hgb and plt appropriate to initiate therapy and  No baseline INR/aPTT ordered by MD.  Will order baseline labs per protocol.  Goal of Therapy:  Heparin  level 0.3-0.7 units/ml Monitor platelets by anticoagulation protocol: Yes  Labs:  0726 2147  HL 0.37 - therapeutic 0727 0608  HL 0.63, therapeutic x 2   Plan:  HL therapeutic x 2  Continue heparin  infusion at 1000 units/hr Recheck heparin  level daily w/ AM labs while therapeutic Continue to monitor H&H and platelets  Rankin CANDIE Dills, PharmD, [facility] 11/14/2023 6:50 AM
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
## 2023-11-14 NOTE — Progress Notes (Signed)
 Pt alert and oriented. Discharge instructions reviewed with pt. Pt verbalized understanding. Pt verbalized understanding of need to schedule/attend follow up appointments. Pt verbalized understanding of need to pick up prescriptions from pharmacy. Pt verbalized understanding of changes to medications. IV removed. IV site WNL. Pt left unit in wheelchair with nurse. Pt left hospital with daughter.

## 2024-02-18 ENCOUNTER — Encounter: Payer: Self-pay | Admitting: Emergency Medicine

## 2024-02-18 ENCOUNTER — Other Ambulatory Visit: Payer: Self-pay

## 2024-02-18 ENCOUNTER — Emergency Department

## 2024-02-18 ENCOUNTER — Emergency Department
Admission: EM | Admit: 2024-02-18 | Discharge: 2024-02-18 | Disposition: A | Discharge status: Home / Self Care | Attending: Emergency Medicine | Admitting: Emergency Medicine

## 2024-02-18 DIAGNOSIS — I129 Hypertensive chronic kidney disease with stage 1 through stage 4 chronic kidney disease, or unspecified chronic kidney disease: Secondary | ICD-10-CM | POA: Diagnosis not present

## 2024-02-18 DIAGNOSIS — I4891 Unspecified atrial fibrillation: Secondary | ICD-10-CM | POA: Insufficient documentation

## 2024-02-18 DIAGNOSIS — M5030 Other cervical disc degeneration, unspecified cervical region: Secondary | ICD-10-CM

## 2024-02-18 DIAGNOSIS — N189 Chronic kidney disease, unspecified: Secondary | ICD-10-CM | POA: Diagnosis not present

## 2024-02-18 DIAGNOSIS — M50322 Other cervical disc degeneration at C5-C6 level: Secondary | ICD-10-CM | POA: Insufficient documentation

## 2024-02-18 DIAGNOSIS — M542 Cervicalgia: Secondary | ICD-10-CM | POA: Diagnosis present

## 2024-02-18 DIAGNOSIS — Z7901 Long term (current) use of anticoagulants: Secondary | ICD-10-CM | POA: Insufficient documentation

## 2024-02-18 MED ORDER — DEXAMETHASONE SOD PHOSPHATE PF 10 MG/ML IJ SOLN
8.0000 mg | Freq: Once | INTRAMUSCULAR | Status: AC
  Administered 2024-02-18: 8 mg via INTRAMUSCULAR

## 2024-02-18 MED ORDER — ACETAMINOPHEN 325 MG PO TABS
650.0000 mg | ORAL_TABLET | Freq: Four times a day (QID) | ORAL | Status: AC | PRN
  Administered 2024-02-18: 650 mg via ORAL
  Filled 2024-02-18: qty 2

## 2024-02-18 MED ORDER — PREDNISONE 5 MG PO TABS: 5.0000 mg | ORAL_TABLET | Freq: Every day | ORAL | 0 refills | Status: DC

## 2024-02-18 NOTE — Discharge Instructions (Signed)
 Keep your appointment with Dr. Sadie.  Prednisone 5 mg 1 daily with food starting tomorrow for the next 5 days.  You may also take Tylenol  as needed for pain which will not interfere with your current medications.  You may also use ice or heat to your neck as needed for discomfort.

## 2024-02-18 NOTE — ED Provider Notes (Signed)
 [facility] Provider Note    Event Date/Time   First MD Initiated Contact with Patient 02/18/24 [phone]     (approximate)   History   Neck Pain   HPI  Timothy Goodwin is a 89 y.o. male   presents to the ED with complaint of neck pain for the last 2 weeks.  Patient states that this has been keeping him from getting sleep due to his pain.  Pain is worse with movement of his neck.  Patient denies any recent injury or prior neck problems.  Patient has not taken any over-the-counter medication as he did not know what would interfere with his present medication.  Patient denies any radiculopathy.  He has history of hypertension, renal artery stenosis, non-STEMI, atrial fibs, CKD, syncope.      Physical Exam   Triage Vital Signs: ED Triage Vitals  Encounter Vitals Group     BP 02/18/24 0647 (!) 207/91     Girls Systolic BP Percentile --      Girls Diastolic BP Percentile --      Boys Systolic BP Percentile --      Boys Diastolic BP Percentile --      Pulse Rate 02/18/24 0647 75     Resp 02/18/24 0647 18     Temp 02/18/24 0647 97.8 F (36.6 C)     Temp Source 02/18/24 0647 Oral     SpO2 02/18/24 0647 98 %     Weight 02/18/24 0725 144 lb 6.4 oz (65.5 kg)     Height 02/18/24 0725 5' 5 (1.651 m)     Head Circumference --      Peak Flow --      Pain Score 02/18/24 0648 9     Pain Loc --      Pain Education --      Exclude from Growth Chart --     Most recent vital signs: Vitals:   02/18/24 0647 02/18/24 0824  BP: (!) 207/91 (!) 160/72  Pulse: 75 79  Resp: 18 18  Temp: 97.8 F (36.6 C) 98.8 F (37.1 C)  SpO2: 98% 97%     General: Awake, no distress.  CV:  Good peripheral perfusion.  Heart rate and rate rhythm. Resp:  Normal effort.  Lungs clear bilaterally. Abd:  No distention.  Other:  Cervical spine posteriorly with tenderness on palpation.  Range of motion is mildly restricted due to increased pain.  No step-offs or abnormality noted  on palpation of the vertebral process.  Skin is intact and without discoloration.  Neck is supple without cervical lymphadenopathy.   ED Results / Procedures / Treatments   Labs (all labs ordered are listed, but only abnormal results are displayed) Labs Reviewed - No data to display   EKG  Vent. rate 75 BPM PR interval 140 ms QRS duration 110 ms QT/QTcB 408/455 ms P-R-T axes 94 17 246 Sinus rhythm with Premature atrial complexes Marked ST abnormality, possible inferior subendocardial injury Abnormal ECG When compared with ECG of 13-Nov-2023 08:57, PREVIOUS ECG IS PRESENT    RADIOLOGY Cervical spine x-ray images were reviewed by myself independent of the radiologist with degenerative changes noted in multiple levels.  Official radiology report shows severe multilevel degenerative disc disease most prominent at C5-C6 with apparent ankylosis at C4-C5 and mild retrolisthesis at C5-C6      PROCEDURES:  Critical Care performed:   Procedures   MEDICATIONS ORDERED IN ED: Medications  acetaminophen  (TYLENOL ) tablet 650 mg (650  mg Oral Given 02/18/24 0652)  dexamethasone  (DECADRON ) injection 8 mg (8 mg Intramuscular Given 02/18/24 0916)     IMPRESSION / MDM / ASSESSMENT AND PLAN / ED COURSE  I reviewed the triage vital signs and the nursing notes.   Differential diagnosis includes, but is not limited to, cervical strain, cervical pain, degenerative disc disease, torticollis, compression fracture cervical spine, musculoskeletal pain.  89 year old male presents to the ED with complaint of neck pain for approximately 2 weeks without history of injury.  Patient states that pain is worse with range of motion as been keeping him awake at night.  He has not taken any over-the-counter medication as he does not know what would interfere with his current medication.  X-rays show degenerative disc disease at multiple levels.  Patient was made aware.  A single injection of Decadron  was  given while in the ED along with a daily dose of prednisone for the next 5 days.  He is to follow-up with his PCP if any continued problems for further evaluation.  Currently he is taking Eliquis  and Plavix  and is aware that he cannot take anti-inflammatories.      Patient's presentation is most consistent with acute complicated illness / injury requiring diagnostic workup.  FINAL CLINICAL IMPRESSION(S) / ED DIAGNOSES   Final diagnoses:  DDD (degenerative disc disease), cervical     Rx / DC Orders   ED Discharge Orders          Ordered    predniSONE (DELTASONE) 5 MG tablet  Daily with breakfast        02/18/24 0905             Note:  This document was prepared using Dragon voice recognition software and may include unintentional dictation errors.   Saunders Shona CROME, PA-C 02/18/24 1501    Dorothyann Drivers, MD 02/18/24 1517

## 2024-02-18 NOTE — ED Triage Notes (Signed)
 Pt arrives POV, ambulatory to triage, gait slow, no acute distress noted c/o neck pain x 2 weeks, denies injury. Pt states he woke up w/ pain and thought it would get better but it has not and last night he was unable to sleep due to the pain. Pt has not been taking otc medications bc he states he didn't know what he could take w/ his prescribed medications.

## 2024-02-18 NOTE — ED Notes (Signed)
 See triage note  Presents with pain to neck   States he noticed pain to neck about 2 weeks ago  Denies any injury or fever States he had not tried any OTC meds  Was given tylenol  at triage  States min relief

## 2024-02-22 ENCOUNTER — Ambulatory Visit: Admitting: Dermatology

## 2024-02-22 DIAGNOSIS — L578 Other skin changes due to chronic exposure to nonionizing radiation: Secondary | ICD-10-CM | POA: Diagnosis not present

## 2024-02-22 DIAGNOSIS — L570 Actinic keratosis: Secondary | ICD-10-CM | POA: Diagnosis not present

## 2024-02-22 DIAGNOSIS — L821 Other seborrheic keratosis: Secondary | ICD-10-CM

## 2024-02-22 DIAGNOSIS — L820 Inflamed seborrheic keratosis: Secondary | ICD-10-CM

## 2024-02-22 DIAGNOSIS — W908XXA Exposure to other nonionizing radiation, initial encounter: Secondary | ICD-10-CM | POA: Diagnosis not present

## 2024-02-22 NOTE — Progress Notes (Signed)
   Follow-Up Visit   Subjective  Timothy Goodwin is a 89 y.o. male who presents for the following: 6 months f/u hx of precancers on his scalp, face and hands, treated his scalp with 5FU/Calcipotriene  on his scalp several months ago.   The following portions of the chart were reviewed this encounter and updated as appropriate: medications, allergies, medical history  Review of Systems:  No other skin or systemic complaints except as noted in HPI or Assessment and Plan.  Objective  Well appearing patient in no apparent distress; mood and affect are within normal limits.  A focused examination was performed of the following areas:face,scalp  Relevant exam findings are noted in the Assessment and Plan.  scalp, face, ears x 18 (18) Erythematous thin papules/macules with gritty scale.  face x 3, hands x 15 (18) Stuck-on, waxy, tan-brown papule or plaque --Discussed benign etiology and prognosis.   Assessment & Plan   SEBORRHEIC KERATOSIS - Stuck-on, waxy, tan-brown papules and/or plaques  - Benign-appearing - Discussed benign etiology and prognosis. - Observe - Call for any changes  AK (ACTINIC KERATOSIS) (18) scalp, face, ears x 18 (18) ACTINIC DAMAGE - chronic, secondary to cumulative UV radiation exposure/sun exposure over time - diffuse scaly erythematous macules with underlying dyspigmentation - Recommend daily broad spectrum sunscreen SPF 30+ to sun-exposed areas, reapply every 2 hours as needed.  - Recommend staying in the shade or wearing long sleeves, sun glasses (UVA+UVB protection) and wide brim hats (4-inch brim around the entire circumference of the hat). - Call for new or changing lesions.  Destruction of lesion - scalp, face, ears x 18 (18) Complexity: simple   Destruction method: cryotherapy   Informed consent: discussed and consent obtained   Timeout:  patient name, date of birth, surgical site, and procedure verified Lesion destroyed using liquid nitrogen:  Yes   Region frozen until ice ball extended beyond lesion: Yes   Outcome: patient tolerated procedure well with no complications   Post-procedure details: wound care instructions given    INFLAMED SEBORRHEIC KERATOSIS (18) face x 3, hands x 15 (18) Symptomatic, irritating, patient would like treated.  Destruction of lesion - face x 3, hands x 15 (18) Complexity: simple   Destruction method: cryotherapy   Informed consent: discussed and consent obtained   Timeout:  patient name, date of birth, surgical site, and procedure verified Lesion destroyed using liquid nitrogen: Yes   Region frozen until ice ball extended beyond lesion: Yes   Outcome: patient tolerated procedure well with no complications   Post-procedure details: wound care instructions given     Return in about 6 months (around 08/21/2024) for Aks .  IFay Kirks, CMA, am acting as scribe for Alm Rhyme, MD .   Documentation: I have reviewed the above documentation for accuracy and completeness, and I agree with the above.  Alm Rhyme, MD

## 2024-02-22 NOTE — Patient Instructions (Signed)

## 2024-02-23 ENCOUNTER — Emergency Department (HOSPITAL_COMMUNITY)

## 2024-02-23 ENCOUNTER — Encounter (HOSPITAL_COMMUNITY): Payer: Self-pay

## 2024-02-23 ENCOUNTER — Inpatient Hospital Stay (HOSPITAL_COMMUNITY)
Admission: EM | Admit: 2024-02-23 | Discharge: 2024-02-26 | DRG: 309 | Disposition: A | Discharge status: Home / Self Care | Attending: Hospitalist | Admitting: Hospitalist

## 2024-02-23 DIAGNOSIS — Z79899 Other long term (current) drug therapy: Secondary | ICD-10-CM

## 2024-02-23 DIAGNOSIS — Z9079 Acquired absence of other genital organ(s): Secondary | ICD-10-CM

## 2024-02-23 DIAGNOSIS — Z8042 Family history of malignant neoplasm of prostate: Secondary | ICD-10-CM

## 2024-02-23 DIAGNOSIS — Z888 Allergy status to other drugs, medicaments and biological substances status: Secondary | ICD-10-CM

## 2024-02-23 DIAGNOSIS — Z951 Presence of aortocoronary bypass graft: Secondary | ICD-10-CM

## 2024-02-23 DIAGNOSIS — R55 Syncope and collapse: Secondary | ICD-10-CM | POA: Diagnosis present

## 2024-02-23 DIAGNOSIS — Z8546 Personal history of malignant neoplasm of prostate: Secondary | ICD-10-CM

## 2024-02-23 DIAGNOSIS — R0789 Other chest pain: Secondary | ICD-10-CM | POA: Diagnosis not present

## 2024-02-23 DIAGNOSIS — N1832 Chronic kidney disease, stage 3b: Secondary | ICD-10-CM | POA: Diagnosis present

## 2024-02-23 DIAGNOSIS — I129 Hypertensive chronic kidney disease with stage 1 through stage 4 chronic kidney disease, or unspecified chronic kidney disease: Secondary | ICD-10-CM | POA: Diagnosis present

## 2024-02-23 DIAGNOSIS — N179 Acute kidney failure, unspecified: Secondary | ICD-10-CM | POA: Diagnosis present

## 2024-02-23 DIAGNOSIS — S129XXA Fracture of neck, unspecified, initial encounter: Secondary | ICD-10-CM | POA: Diagnosis present

## 2024-02-23 DIAGNOSIS — Z886 Allergy status to analgesic agent status: Secondary | ICD-10-CM

## 2024-02-23 DIAGNOSIS — Z85828 Personal history of other malignant neoplasm of skin: Secondary | ICD-10-CM

## 2024-02-23 DIAGNOSIS — I739 Peripheral vascular disease, unspecified: Secondary | ICD-10-CM | POA: Diagnosis present

## 2024-02-23 DIAGNOSIS — I4891 Unspecified atrial fibrillation: Secondary | ICD-10-CM | POA: Diagnosis present

## 2024-02-23 DIAGNOSIS — Z7901 Long term (current) use of anticoagulants: Secondary | ICD-10-CM

## 2024-02-23 DIAGNOSIS — I10 Essential (primary) hypertension: Secondary | ICD-10-CM | POA: Diagnosis present

## 2024-02-23 DIAGNOSIS — S12601A Unspecified nondisplaced fracture of seventh cervical vertebra, initial encounter for closed fracture: Secondary | ICD-10-CM | POA: Diagnosis present

## 2024-02-23 DIAGNOSIS — I2510 Atherosclerotic heart disease of native coronary artery without angina pectoris: Secondary | ICD-10-CM | POA: Diagnosis present

## 2024-02-23 DIAGNOSIS — I252 Old myocardial infarction: Secondary | ICD-10-CM

## 2024-02-23 DIAGNOSIS — I480 Paroxysmal atrial fibrillation: Principal | ICD-10-CM | POA: Diagnosis present

## 2024-02-23 DIAGNOSIS — Y92410 Unspecified street and highway as the place of occurrence of the external cause: Secondary | ICD-10-CM

## 2024-02-23 DIAGNOSIS — S069X9A Unspecified intracranial injury with loss of consciousness of unspecified duration, initial encounter: Secondary | ICD-10-CM | POA: Diagnosis present

## 2024-02-23 DIAGNOSIS — Z7902 Long term (current) use of antithrombotics/antiplatelets: Secondary | ICD-10-CM

## 2024-02-23 DIAGNOSIS — E785 Hyperlipidemia, unspecified: Secondary | ICD-10-CM | POA: Diagnosis present

## 2024-02-23 DIAGNOSIS — Z882 Allergy status to sulfonamides status: Secondary | ICD-10-CM

## 2024-02-23 DIAGNOSIS — N183 Chronic kidney disease, stage 3 unspecified: Secondary | ICD-10-CM | POA: Diagnosis present

## 2024-02-23 LAB — I-STAT CHEM 8, ED
BUN: 44 mg/dL — ABNORMAL HIGH (ref 8–23)
Calcium, Ion: 1.12 mmol/L — ABNORMAL LOW (ref 1.15–1.40)
Chloride: 107 mmol/L (ref 98–111)
Creatinine, Ser: 2.6 mg/dL — ABNORMAL HIGH (ref 0.61–1.24)
Glucose, Bld: 183 mg/dL — ABNORMAL HIGH (ref 70–99)
HCT: 42 % (ref 39.0–52.0)
Hemoglobin: 14.3 g/dL (ref 13.0–17.0)
Potassium: 4.3 mmol/L (ref 3.5–5.1)
Sodium: 141 mmol/L (ref 135–145)
TCO2: 24 mmol/L (ref 22–32)

## 2024-02-23 LAB — URINALYSIS, ROUTINE W REFLEX MICROSCOPIC
Bacteria, UA: NONE SEEN
Bilirubin Urine: NEGATIVE
Glucose, UA: NEGATIVE mg/dL
Hgb urine dipstick: NEGATIVE
Ketones, ur: NEGATIVE mg/dL
Leukocytes,Ua: NEGATIVE
Nitrite: NEGATIVE
Protein, ur: 30 mg/dL — AB
Specific Gravity, Urine: 1.012 (ref 1.005–1.030)
pH: 7 (ref 5.0–8.0)

## 2024-02-23 LAB — CBC
HCT: 42.3 % (ref 39.0–52.0)
Hemoglobin: 14.2 g/dL (ref 13.0–17.0)
MCH: 31.3 pg (ref 26.0–34.0)
MCHC: 33.6 g/dL (ref 30.0–36.0)
MCV: 93.2 fL (ref 80.0–100.0)
Platelets: 275 K/uL (ref 150–400)
RBC: 4.54 MIL/uL (ref 4.22–5.81)
RDW: 12.5 % (ref 11.5–15.5)
WBC: 8.5 K/uL (ref 4.0–10.5)
nRBC: 0 % (ref 0.0–0.2)

## 2024-02-23 LAB — COMPREHENSIVE METABOLIC PANEL WITH GFR
ALT: 32 U/L (ref 0–44)
AST: 32 U/L (ref 15–41)
Albumin: 3.8 g/dL (ref 3.5–5.0)
Alkaline Phosphatase: 54 U/L (ref 38–126)
Anion gap: 12 (ref 5–15)
BUN: 43 mg/dL — ABNORMAL HIGH (ref 8–23)
CO2: 23 mmol/L (ref 22–32)
Calcium: 9 mg/dL (ref 8.9–10.3)
Chloride: 106 mmol/L (ref 98–111)
Creatinine, Ser: 2.5 mg/dL — ABNORMAL HIGH (ref 0.61–1.24)
GFR, Estimated: 24 mL/min — ABNORMAL LOW (ref >60)
Glucose, Bld: 192 mg/dL — ABNORMAL HIGH (ref 70–99)
Potassium: 4.3 mmol/L (ref 3.5–5.1)
Sodium: 141 mmol/L (ref 135–145)
Total Bilirubin: 0.7 mg/dL (ref 0.0–1.2)
Total Protein: 7 g/dL (ref 6.5–8.1)

## 2024-02-23 LAB — PROTIME-INR
INR: 1.1 (ref 0.8–1.2)
Prothrombin Time: 15.1 s (ref 11.4–15.2)

## 2024-02-23 LAB — ETHANOL: Alcohol, Ethyl (B): <15 mg/dL (ref <15)

## 2024-02-23 LAB — I-STAT CG4 LACTIC ACID, ED: Lactic Acid, Venous: 1.7 mmol/L (ref 0.5–1.9)

## 2024-02-23 LAB — TYPE AND SCREEN
ABO/RH(D): O NEG
Antibody Screen: NEGATIVE

## 2024-02-23 LAB — TROPONIN I (HIGH SENSITIVITY)
Troponin I (High Sensitivity): 17 ng/L (ref <18)
Troponin I (High Sensitivity): 28 ng/L — ABNORMAL HIGH (ref <18)

## 2024-02-23 LAB — ABO/RH: ABO/RH(D): O NEG

## 2024-02-23 MED ORDER — OXYCODONE HCL 5 MG PO TABS
5.0000 mg | ORAL_TABLET | Freq: Once | ORAL | Status: AC
  Administered 2024-02-23: 5 mg via ORAL
  Filled 2024-02-23: qty 1

## 2024-02-23 MED ORDER — METOPROLOL TARTRATE 5 MG/5ML IV SOLN
5.0000 mg | Freq: Once | INTRAVENOUS | Status: AC
  Administered 2024-02-23: 5 mg via INTRAVENOUS
  Filled 2024-02-23: qty 5

## 2024-02-23 MED ORDER — MORPHINE SULFATE (PF) 4 MG/ML IV SOLN
4.0000 mg | Freq: Once | INTRAVENOUS | Status: DC
  Filled 2024-02-23: qty 1

## 2024-02-23 MED ORDER — OXYCODONE HCL 5 MG PO TABS: 5.0000 mg | ORAL_TABLET | Freq: Four times a day (QID) | ORAL | 0 refills | Status: DC | PRN

## 2024-02-23 MED ORDER — HYDRALAZINE HCL 20 MG/ML IJ SOLN: 10.0000 mg | Freq: Once | INTRAMUSCULAR | Status: DC

## 2024-02-23 MED ORDER — METOPROLOL TARTRATE 25 MG PO TABS
25.0000 mg | ORAL_TABLET | Freq: Once | ORAL | Status: AC
  Administered 2024-02-23: 25 mg via ORAL
  Filled 2024-02-23: qty 1

## 2024-02-23 NOTE — Progress Notes (Signed)
 Orthopedic Tech Progress Note Patient Details:  Timothy Goodwin [facility] 06-Sep-1934 [id number] LV2T FOT No orders at this time. Patient ID: Timothy Goodwin, male   DOB: [birth year], 89 y.o.   MRN: [id number]  [name] Lukes 02/23/2024, 9:29 PM
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
## 2024-02-23 NOTE — ED Notes (Signed)
 Patient transported to CT with RN to transport.

## 2024-02-23 NOTE — ED Notes (Signed)
 CCMD called for cardiac monitoring.
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
## 2024-02-23 NOTE — ED Notes (Signed)
 Patient transported to CT on portable monitor, spinal precautions maintained

## 2024-02-24 ENCOUNTER — Inpatient Hospital Stay (HOSPITAL_COMMUNITY)

## 2024-02-24 ENCOUNTER — Other Ambulatory Visit: Payer: Self-pay

## 2024-02-24 DIAGNOSIS — N1832 Chronic kidney disease, stage 3b: Secondary | ICD-10-CM | POA: Diagnosis present

## 2024-02-24 DIAGNOSIS — I129 Hypertensive chronic kidney disease with stage 1 through stage 4 chronic kidney disease, or unspecified chronic kidney disease: Secondary | ICD-10-CM | POA: Diagnosis present

## 2024-02-24 DIAGNOSIS — I480 Paroxysmal atrial fibrillation: Secondary | ICD-10-CM | POA: Diagnosis present

## 2024-02-24 DIAGNOSIS — Z886 Allergy status to analgesic agent status: Secondary | ICD-10-CM | POA: Diagnosis not present

## 2024-02-24 DIAGNOSIS — Y92410 Unspecified street and highway as the place of occurrence of the external cause: Secondary | ICD-10-CM | POA: Diagnosis not present

## 2024-02-24 DIAGNOSIS — I739 Peripheral vascular disease, unspecified: Secondary | ICD-10-CM | POA: Diagnosis present

## 2024-02-24 DIAGNOSIS — S12601A Unspecified nondisplaced fracture of seventh cervical vertebra, initial encounter for closed fracture: Secondary | ICD-10-CM | POA: Diagnosis present

## 2024-02-24 DIAGNOSIS — R55 Syncope and collapse: Secondary | ICD-10-CM

## 2024-02-24 DIAGNOSIS — S069X9A Unspecified intracranial injury with loss of consciousness of unspecified duration, initial encounter: Secondary | ICD-10-CM | POA: Diagnosis present

## 2024-02-24 DIAGNOSIS — Z7901 Long term (current) use of anticoagulants: Secondary | ICD-10-CM | POA: Diagnosis not present

## 2024-02-24 DIAGNOSIS — Z8042 Family history of malignant neoplasm of prostate: Secondary | ICD-10-CM | POA: Diagnosis not present

## 2024-02-24 DIAGNOSIS — Z9079 Acquired absence of other genital organ(s): Secondary | ICD-10-CM | POA: Diagnosis not present

## 2024-02-24 DIAGNOSIS — Z79899 Other long term (current) drug therapy: Secondary | ICD-10-CM | POA: Diagnosis not present

## 2024-02-24 DIAGNOSIS — Z951 Presence of aortocoronary bypass graft: Secondary | ICD-10-CM | POA: Diagnosis not present

## 2024-02-24 DIAGNOSIS — Z882 Allergy status to sulfonamides status: Secondary | ICD-10-CM | POA: Diagnosis not present

## 2024-02-24 DIAGNOSIS — I4891 Unspecified atrial fibrillation: Secondary | ICD-10-CM | POA: Diagnosis present

## 2024-02-24 DIAGNOSIS — R0789 Other chest pain: Secondary | ICD-10-CM | POA: Diagnosis present

## 2024-02-24 DIAGNOSIS — Z8546 Personal history of malignant neoplasm of prostate: Secondary | ICD-10-CM | POA: Diagnosis not present

## 2024-02-24 DIAGNOSIS — E785 Hyperlipidemia, unspecified: Secondary | ICD-10-CM | POA: Diagnosis present

## 2024-02-24 DIAGNOSIS — Z85828 Personal history of other malignant neoplasm of skin: Secondary | ICD-10-CM | POA: Diagnosis not present

## 2024-02-24 DIAGNOSIS — Z7902 Long term (current) use of antithrombotics/antiplatelets: Secondary | ICD-10-CM | POA: Diagnosis not present

## 2024-02-24 DIAGNOSIS — R569 Unspecified convulsions: Secondary | ICD-10-CM

## 2024-02-24 DIAGNOSIS — N179 Acute kidney failure, unspecified: Secondary | ICD-10-CM | POA: Diagnosis present

## 2024-02-24 DIAGNOSIS — I2510 Atherosclerotic heart disease of native coronary artery without angina pectoris: Secondary | ICD-10-CM

## 2024-02-24 DIAGNOSIS — R7989 Other specified abnormal findings of blood chemistry: Secondary | ICD-10-CM | POA: Diagnosis not present

## 2024-02-24 DIAGNOSIS — Z888 Allergy status to other drugs, medicaments and biological substances status: Secondary | ICD-10-CM | POA: Diagnosis not present

## 2024-02-24 DIAGNOSIS — I252 Old myocardial infarction: Secondary | ICD-10-CM | POA: Diagnosis not present

## 2024-02-24 LAB — ETHANOL: Alcohol, Ethyl (B): <15 mg/dL (ref <15)

## 2024-02-24 MED ORDER — SODIUM CHLORIDE 0.9 % IV SOLN: INTRAVENOUS | Status: DC

## 2024-02-24 MED ORDER — METOPROLOL TARTRATE 25 MG PO TABS
25.0000 mg | ORAL_TABLET | Freq: Four times a day (QID) | ORAL | Status: DC
  Administered 2024-02-24 – 2024-02-25 (×3): 25 mg via ORAL
  Filled 2024-02-24 (×3): qty 1

## 2024-02-24 MED ORDER — METOPROLOL TARTRATE 12.5 MG HALF TABLET: 12.5000 mg | ORAL_TABLET | Freq: Four times a day (QID) | ORAL | Status: DC

## 2024-02-24 MED ORDER — CLOPIDOGREL BISULFATE 75 MG PO TABS: 75.0000 mg | ORAL_TABLET | Freq: Every day | ORAL | Status: DC

## 2024-02-24 MED ORDER — ACETAMINOPHEN 325 MG PO TABS
650.0000 mg | ORAL_TABLET | Freq: Four times a day (QID) | ORAL | Status: DC | PRN
  Administered 2024-02-24: 650 mg via ORAL
  Filled 2024-02-24: qty 2

## 2024-02-24 MED ORDER — DILTIAZEM LOAD VIA INFUSION
10.0000 mg | Freq: Once | INTRAVENOUS | Status: AC
  Administered 2024-02-24: 10 mg via INTRAVENOUS
  Filled 2024-02-24: qty 10

## 2024-02-24 MED ORDER — DILTIAZEM HCL-DEXTROSE 125-5 MG/125ML-% IV SOLN (PREMIX)
5.0000 mg/h | INTRAVENOUS | Status: DC
  Administered 2024-02-24: 15 mg/h via INTRAVENOUS
  Administered 2024-02-24: 5 mg/h via INTRAVENOUS
  Administered 2024-02-24: 15 mg/h via INTRAVENOUS
  Filled 2024-02-24 (×3): qty 125

## 2024-02-24 MED ORDER — APIXABAN 2.5 MG PO TABS: 2.5000 mg | ORAL_TABLET | Freq: Two times a day (BID) | ORAL | Status: DC

## 2024-02-24 MED ORDER — AMLODIPINE BESYLATE 5 MG PO TABS: 2.5000 mg | ORAL_TABLET | Freq: Every day | ORAL | Status: DC

## 2024-02-24 MED ORDER — ATORVASTATIN CALCIUM 10 MG PO TABS
10.0000 mg | ORAL_TABLET | Freq: Every day | ORAL | Status: DC
  Administered 2024-02-24 – 2024-02-26 (×3): 10 mg via ORAL
  Filled 2024-02-24 (×3): qty 1

## 2024-02-24 NOTE — TOC CM/SW Note (Signed)
 Transition of Care [facility]) - Inpatient Brief Assessment   Patient Details  Name: Timothy Goodwin MRN: [id number] Date of Birth: January 09, 1935  [facility] CM/SW Contact:    Waddell Barnie Rama, RN Phone Number: 02/24/2024, 4:12 PM   Clinical Narrative: From home with spouse, has PCP and insurance on file, states has no HH services in place at this time or DME at home.  States family member (daughter)  will transport them home at [employer] and family is support system, states gets medications from [facility] [address] in [place] and some thru [employer].  Pta self ambulatory.   There are no ICM needs identified  at this time.  Please place consult for ICM needs.     Transition of Care Asessment: Insurance and Status: Insurance coverage has been reviewed Patient has primary care physician: Yes Home environment has been reviewed: home with wife [place] [employer] Prior level of function:: indep Prior/Current Home Services: No current home services Social Drivers of Health Review: SDOH reviewed no interventions necessary Readmission risk has been reviewed: Yes Transition of care needs: no transition of care needs at this time
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
## 2024-02-24 NOTE — Progress Notes (Signed)
 EEG complete. Results pending.  ?
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
## 2024-02-24 NOTE — ED Notes (Signed)
 Secure chat sent to Dr. Debby regarding heart rate and BP increase. Secretary also paging.

## 2024-02-24 NOTE — Procedures (Signed)
 Patient Name: Timothy Goodwin  MRN: [id number]  Epilepsy Attending: Arlin MALVA Krebs  Referring Physician/Provider: Georgina Basket, MD  Date: 02/24/2024  Duration: 22.08 mins  Patient history: 89yo M with syncope. EEG to evaluate for seizure  Level of alertness: Awake, asleep  AEDs during EEG study: None  Technical aspects: This EEG study was done with scalp electrodes positioned according to the 10-20 International system of electrode placement. Electrical activity was reviewed with band pass filter of 1-70Hz , sensitivity of 7 uV/mm, display speed of 37mm/sec with a 60Hz  notched filter applied as appropriate. EEG data were recorded continuously and digitally stored.  Video monitoring was available and reviewed as appropriate.  Description: The posterior dominant rhythm consists of 8-9Hz  activity of moderate voltage (25-35 uV) seen predominantly in posterior head regions, symmetric and reactive to eye opening and eye closing. Sleep was characterized by vertex waves, sleep spindles (12 to 14 Hz), maximal frontocentral region. Hyperventilation and photic stimulation were not performed.     IMPRESSION: This study is within normal limits. No seizures or epileptiform discharges were seen throughout the recording.  A normal interictal EEG does not exclude the diagnosis of epilepsy.   Niccolo Burggraf O Celedonio Sortino

## 2024-02-24 NOTE — ED Notes (Signed)
 Pt requesting something for pain. Pt c/o neck pain as well as generalized body aches and arm pain. MD Debby raker via secure chat.

## 2024-02-24 NOTE — TOC CAGE-AID Note (Signed)
 Transition of Care [facility]) - CAGE-AID Screening   Patient Details  Name: Timothy Goodwin MRN: [id number] Date of Birth: [birth year]  [facility] of [facility] CM/SW Contact:    Anelly Samarin E Kelsei Defino, LCSW Phone Number: 02/24/2024, 3:34 PM   Clinical Narrative: No SA noted.   CAGE-AID Screening:    Have You Ever Felt You Ought to Cut Down on Your Drinking or Drug Use?: No Have People Annoyed You By Critizing Your Drinking Or Drug Use?: No Have You Felt Bad Or Guilty About Your Drinking Or Drug Use?: No Have You Ever Had a Drink or Used Drugs First Thing In The Morning to Steady Your Nerves or to Get Rid of a Hangover?: No CAGE-AID Score: 0  Substance Abuse Education Offered: No

## 2024-02-25 DIAGNOSIS — R7989 Other specified abnormal findings of blood chemistry: Secondary | ICD-10-CM | POA: Diagnosis not present

## 2024-02-25 DIAGNOSIS — I480 Paroxysmal atrial fibrillation: Secondary | ICD-10-CM | POA: Diagnosis not present

## 2024-02-25 DIAGNOSIS — I4891 Unspecified atrial fibrillation: Secondary | ICD-10-CM | POA: Diagnosis not present

## 2024-02-25 LAB — BASIC METABOLIC PANEL WITH GFR
Anion gap: 12 (ref 5–15)
BUN: 37 mg/dL — ABNORMAL HIGH (ref 8–23)
CO2: 21 mmol/L — ABNORMAL LOW (ref 22–32)
Calcium: 8.3 mg/dL — ABNORMAL LOW (ref 8.9–10.3)
Chloride: 107 mmol/L (ref 98–111)
Creatinine, Ser: 2.02 mg/dL — ABNORMAL HIGH (ref 0.61–1.24)
GFR, Estimated: 31 mL/min — ABNORMAL LOW (ref >60)
Glucose, Bld: 146 mg/dL — ABNORMAL HIGH (ref 70–99)
Potassium: 4.1 mmol/L (ref 3.5–5.1)
Sodium: 140 mmol/L (ref 135–145)

## 2024-02-25 MED ORDER — APIXABAN 2.5 MG PO TABS
2.5000 mg | ORAL_TABLET | Freq: Two times a day (BID) | ORAL | Status: DC
  Administered 2024-02-25 – 2024-02-26 (×3): 2.5 mg via ORAL
  Filled 2024-02-25 (×3): qty 1

## 2024-02-25 MED ORDER — METOPROLOL TARTRATE 50 MG PO TABS
50.0000 mg | ORAL_TABLET | Freq: Four times a day (QID) | ORAL | Status: DC
  Administered 2024-02-25 – 2024-02-26 (×3): 50 mg via ORAL
  Filled 2024-02-25 (×3): qty 1

## 2024-02-25 MED ORDER — CLOPIDOGREL BISULFATE 75 MG PO TABS
75.0000 mg | ORAL_TABLET | Freq: Every day | ORAL | Status: DC
  Administered 2024-02-25 – 2024-02-26 (×2): 75 mg via ORAL
  Filled 2024-02-25 (×2): qty 1

## 2024-02-25 NOTE — Progress Notes (Signed)
 OT Cancellation Note  Patient Details Name: Timothy Goodwin MRN: [id number] DOB: [birth year]   Cancelled Treatment:    Reason Eval/Treat Not Completed: OT screened, no needs identified, will sign off Per PT, pt independent with ADLs and mobility. No formal OT eval needed at this time.  Mliss Fish 02/25/2024, 7:54 AM
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
## 2024-02-25 NOTE — Plan of Care (Signed)

## 2024-02-25 NOTE — Progress Notes (Signed)
 Mobility Specialist Progress Note:    02/25/24 1308  Mobility  Activity Ambulated with assistance  Level of Assistance Independent after set-up  Assistive Device None  Distance Ambulated (ft) 400 ft  Activity Response Tolerated well  Mobility Referral Yes  Mobility visit 1 Mobility  Mobility Specialist Start Time (ACUTE ONLY) 1308  Mobility Specialist Stop Time (ACUTE ONLY) 1316  Mobility Specialist Time Calculation (min) (ACUTE ONLY) 8 min   Received pt in chair agreeable and requesting session. No c/o any symptoms. Pt moving and ambulating well. Returned pt to room w/ all needs met.   Venetia Keel Mobility Specialist Please Neurosurgeon or Rehab Office at [phone]
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
## 2024-02-25 NOTE — Progress Notes (Signed)
   Spoke with Dr. Darnella (Neurosurgergy) who gave the okay to restart both Eliquis  and Plavix . Will reorder these. He is on reduced dose of Eliquis  (2.5mg  twice daily) given age and renal function.  Timothy Goodwin E Timothy Kessinger, PA-C 02/25/2024 1:42 PM

## 2024-02-26 DIAGNOSIS — S129XXA Fracture of neck, unspecified, initial encounter: Secondary | ICD-10-CM | POA: Diagnosis present

## 2024-02-26 DIAGNOSIS — I4891 Unspecified atrial fibrillation: Secondary | ICD-10-CM | POA: Diagnosis not present

## 2024-02-26 LAB — BASIC METABOLIC PANEL WITH GFR
Anion gap: 11 (ref 5–15)
BUN: 37 mg/dL — ABNORMAL HIGH (ref 8–23)
CO2: 21 mmol/L — ABNORMAL LOW (ref 22–32)
Calcium: 8.5 mg/dL — ABNORMAL LOW (ref 8.9–10.3)
Chloride: 109 mmol/L (ref 98–111)
Creatinine, Ser: 1.89 mg/dL — ABNORMAL HIGH (ref 0.61–1.24)
GFR, Estimated: 34 mL/min — ABNORMAL LOW (ref >60)
Glucose, Bld: 106 mg/dL — ABNORMAL HIGH (ref 70–99)
Potassium: 4.3 mmol/L (ref 3.5–5.1)
Sodium: 141 mmol/L (ref 135–145)

## 2024-02-26 MED ORDER — METOPROLOL TARTRATE 25 MG PO TABS: 25.0000 mg | ORAL_TABLET | Freq: Two times a day (BID) | ORAL | Status: DC

## 2024-02-26 MED ORDER — SENNOSIDES-DOCUSATE SODIUM 8.6-50 MG PO TABS
1.0000 | ORAL_TABLET | Freq: Two times a day (BID) | ORAL | Status: DC
Start: 2024-02-26 — End: 2024-02-26
  Administered 2024-02-26: 1 via ORAL
  Filled 2024-02-26: qty 1

## 2024-02-26 MED ORDER — CLONIDINE HCL 0.1 MG PO TABS
0.1000 mg | ORAL_TABLET | Freq: Two times a day (BID) | ORAL | Status: DC
  Administered 2024-02-26: 0.1 mg via ORAL
  Filled 2024-02-26: qty 1

## 2024-02-26 MED ORDER — HYDRALAZINE HCL 20 MG/ML IJ SOLN: 10.0000 mg | Freq: Four times a day (QID) | INTRAMUSCULAR | Status: DC | PRN
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
## 2024-02-26 NOTE — Plan of Care (Signed)
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
## 2024-02-26 NOTE — Progress Notes (Signed)
 Discharge instructions provided by Charolette Dunnings, RN. Patient verbalizes understanding of all instructions and follow-up care. Patient discharged to home via wheelchair by RN with all belongings at 1149 on 02/26/24.

## 2024-02-28 ENCOUNTER — Encounter: Payer: Self-pay | Admitting: Dermatology

## 2024-02-29 LAB — CULTURE, BLOOD (ROUTINE X 2)
Culture: NO GROWTH
Culture: NO GROWTH

## 2024-03-08 ENCOUNTER — Other Ambulatory Visit: Payer: Self-pay

## 2024-03-08 ENCOUNTER — Observation Stay
Admission: EM | Admit: 2024-03-08 | Discharge: 2024-03-09 | Disposition: A | Discharge status: Home / Self Care | Attending: Emergency Medicine | Admitting: Emergency Medicine

## 2024-03-08 ENCOUNTER — Emergency Department: Admit: 2024-03-08 | Discharge: 2024-03-08 | Disposition: A | Attending: Internal Medicine

## 2024-03-08 ENCOUNTER — Emergency Department

## 2024-03-08 ENCOUNTER — Encounter: Payer: Self-pay | Admitting: Internal Medicine

## 2024-03-08 DIAGNOSIS — I252 Old myocardial infarction: Secondary | ICD-10-CM | POA: Insufficient documentation

## 2024-03-08 DIAGNOSIS — Z602 Problems related to living alone: Secondary | ICD-10-CM | POA: Diagnosis not present

## 2024-03-08 DIAGNOSIS — E785 Hyperlipidemia, unspecified: Secondary | ICD-10-CM | POA: Diagnosis not present

## 2024-03-08 DIAGNOSIS — Z1152 Encounter for screening for COVID-19: Secondary | ICD-10-CM | POA: Diagnosis not present

## 2024-03-08 DIAGNOSIS — I480 Paroxysmal atrial fibrillation: Secondary | ICD-10-CM | POA: Diagnosis not present

## 2024-03-08 DIAGNOSIS — I5A Non-ischemic myocardial injury (non-traumatic): Secondary | ICD-10-CM | POA: Diagnosis not present

## 2024-03-08 DIAGNOSIS — Z951 Presence of aortocoronary bypass graft: Secondary | ICD-10-CM | POA: Diagnosis not present

## 2024-03-08 DIAGNOSIS — I10 Essential (primary) hypertension: Secondary | ICD-10-CM | POA: Diagnosis present

## 2024-03-08 DIAGNOSIS — I4891 Unspecified atrial fibrillation: Secondary | ICD-10-CM | POA: Diagnosis present

## 2024-03-08 DIAGNOSIS — Z7902 Long term (current) use of antithrombotics/antiplatelets: Secondary | ICD-10-CM | POA: Diagnosis not present

## 2024-03-08 DIAGNOSIS — I4892 Unspecified atrial flutter: Secondary | ICD-10-CM | POA: Diagnosis not present

## 2024-03-08 DIAGNOSIS — Z79899 Other long term (current) drug therapy: Secondary | ICD-10-CM | POA: Insufficient documentation

## 2024-03-08 DIAGNOSIS — I1310 Hypertensive heart and chronic kidney disease without heart failure, with stage 1 through stage 4 chronic kidney disease, or unspecified chronic kidney disease: Secondary | ICD-10-CM | POA: Diagnosis not present

## 2024-03-08 DIAGNOSIS — Z7901 Long term (current) use of anticoagulants: Secondary | ICD-10-CM | POA: Diagnosis not present

## 2024-03-08 DIAGNOSIS — N183 Chronic kidney disease, stage 3 unspecified: Secondary | ICD-10-CM | POA: Diagnosis present

## 2024-03-08 DIAGNOSIS — R7989 Other specified abnormal findings of blood chemistry: Secondary | ICD-10-CM | POA: Diagnosis not present

## 2024-03-08 DIAGNOSIS — R079 Chest pain, unspecified: Secondary | ICD-10-CM | POA: Diagnosis present

## 2024-03-08 DIAGNOSIS — I21A1 Myocardial infarction type 2: Secondary | ICD-10-CM | POA: Insufficient documentation

## 2024-03-08 DIAGNOSIS — N1832 Chronic kidney disease, stage 3b: Secondary | ICD-10-CM | POA: Diagnosis not present

## 2024-03-08 LAB — CBC WITH DIFFERENTIAL/PLATELET
Abs Immature Granulocytes: 0.01 K/uL (ref 0.00–0.07)
Basophils Absolute: 0 K/uL (ref 0.0–0.1)
Basophils Relative: 0 %
Eosinophils Absolute: 0.1 K/uL (ref 0.0–0.5)
Eosinophils Relative: 1 %
HCT: 36.7 % — ABNORMAL LOW (ref 39.0–52.0)
Hemoglobin: 12 g/dL — ABNORMAL LOW (ref 13.0–17.0)
Immature Granulocytes: 0 %
Lymphocytes Relative: 7 %
Lymphs Abs: 0.4 K/uL — ABNORMAL LOW (ref 0.7–4.0)
MCH: 30.9 pg (ref 26.0–34.0)
MCHC: 32.7 g/dL (ref 30.0–36.0)
MCV: 94.6 fL (ref 80.0–100.0)
Monocytes Absolute: 0.4 K/uL (ref 0.1–1.0)
Monocytes Relative: 6 %
Neutro Abs: 5.2 K/uL (ref 1.7–7.7)
Neutrophils Relative %: 86 %
Platelets: 188 K/uL (ref 150–400)
RBC: 3.88 MIL/uL — ABNORMAL LOW (ref 4.22–5.81)
RDW: 13 % (ref 11.5–15.5)
WBC: 6.1 K/uL (ref 4.0–10.5)
nRBC: 0 % (ref 0.0–0.2)

## 2024-03-08 LAB — BASIC METABOLIC PANEL WITH GFR
Anion gap: 9 (ref 5–15)
BUN: 32 mg/dL — ABNORMAL HIGH (ref 8–23)
CO2: 19 mmol/L — ABNORMAL LOW (ref 22–32)
Calcium: 7.6 mg/dL — ABNORMAL LOW (ref 8.9–10.3)
Chloride: 111 mmol/L (ref 98–111)
Creatinine, Ser: 1.89 mg/dL — ABNORMAL HIGH (ref 0.61–1.24)
GFR, Estimated: 34 mL/min — ABNORMAL LOW (ref >60)
Glucose, Bld: 99 mg/dL (ref 70–99)
Potassium: 4.6 mmol/L (ref 3.5–5.1)
Sodium: 139 mmol/L (ref 135–145)

## 2024-03-08 LAB — ECHOCARDIOGRAM COMPLETE
AR max vel: 1.88 cm2
AV Area VTI: 2.06 cm2
AV Area mean vel: 1.83 cm2
AV Mean grad: 2.5 mmHg
AV Peak grad: 4.9 mmHg
Ao pk vel: 1.1 m/s
Area-P 1/2: 7.16 cm2
Height: 63 in
S' Lateral: 3.7 cm
Weight: 2257.51 [oz_av]

## 2024-03-08 LAB — MAGNESIUM: Magnesium: 2 mg/dL (ref 1.7–2.4)

## 2024-03-08 LAB — APTT: aPTT: 33 s (ref 24–36)

## 2024-03-08 LAB — RESP PANEL BY RT-PCR (RSV, FLU A&B, COVID)  RVPGX2
Influenza A by PCR: NEGATIVE
Influenza B by PCR: NEGATIVE
Resp Syncytial Virus by PCR: NEGATIVE
SARS Coronavirus 2 by RT PCR: NEGATIVE

## 2024-03-08 LAB — TROPONIN T, HIGH SENSITIVITY
Troponin T High Sensitivity: 77 ng/L — ABNORMAL HIGH (ref 0–19)
Troponin T High Sensitivity: 116 ng/L (ref 0–19)
Troponin T High Sensitivity: 199 ng/L (ref 0–19)

## 2024-03-08 LAB — PROTIME-INR
INR: 1.1 (ref 0.8–1.2)
Prothrombin Time: 15.2 s (ref 11.4–15.2)

## 2024-03-08 LAB — HEPARIN LEVEL (UNFRACTIONATED): Heparin Unfractionated: 0.42 [IU]/mL (ref 0.30–0.70)

## 2024-03-08 MED ORDER — CLONIDINE HCL 0.1 MG PO TABS
0.1000 mg | ORAL_TABLET | Freq: Two times a day (BID) | ORAL | Status: DC
  Administered 2024-03-09 (×2): 0.1 mg via ORAL
  Filled 2024-03-08 (×2): qty 1

## 2024-03-08 MED ORDER — ORAL CARE MOUTH RINSE: 15.0000 mL | OROMUCOSAL | Status: DC | PRN

## 2024-03-08 MED ORDER — METOPROLOL TARTRATE 25 MG PO TABS
25.0000 mg | ORAL_TABLET | Freq: Two times a day (BID) | ORAL | Status: DC
  Administered 2024-03-08: 25 mg via ORAL
  Filled 2024-03-08 (×2): qty 1

## 2024-03-08 MED ORDER — AMIODARONE HCL IN DEXTROSE 360-4.14 MG/200ML-% IV SOLN
30.0000 mg/h | INTRAVENOUS | Status: DC
  Administered 2024-03-08 – 2024-03-09 (×2): 30 mg/h via INTRAVENOUS
  Filled 2024-03-08 (×2): qty 200

## 2024-03-08 MED ORDER — AMIODARONE HCL IN DEXTROSE 360-4.14 MG/200ML-% IV SOLN
60.0000 mg/h | INTRAVENOUS | Status: AC
  Administered 2024-03-08 (×2): 60 mg/h via INTRAVENOUS
  Filled 2024-03-08: qty 200

## 2024-03-08 MED ORDER — ACETAMINOPHEN 325 MG PO TABS: 650.0000 mg | ORAL_TABLET | Freq: Four times a day (QID) | ORAL | Status: DC | PRN

## 2024-03-08 MED ORDER — SODIUM CHLORIDE 0.9 % IV BOLUS
500.0000 mL | Freq: Once | INTRAVENOUS | Status: AC
  Administered 2024-03-08: 500 mL via INTRAVENOUS

## 2024-03-08 MED ORDER — SODIUM CHLORIDE 0.9% FLUSH
3.0000 mL | Freq: Two times a day (BID) | INTRAVENOUS | Status: DC
  Administered 2024-03-08 – 2024-03-09 (×2): 3 mL via INTRAVENOUS

## 2024-03-08 MED ORDER — AMIODARONE LOAD VIA INFUSION
150.0000 mg | Freq: Once | INTRAVENOUS | Status: AC
  Administered 2024-03-08: 150 mg via INTRAVENOUS
  Filled 2024-03-08: qty 83.34

## 2024-03-08 MED ORDER — ACETAMINOPHEN 650 MG RE SUPP: 650.0000 mg | Freq: Four times a day (QID) | RECTAL | Status: DC | PRN

## 2024-03-08 MED ORDER — METOPROLOL TARTRATE 25 MG PO TABS
25.0000 mg | ORAL_TABLET | Freq: Once | ORAL | Status: AC
  Administered 2024-03-08: 25 mg via ORAL
  Filled 2024-03-08: qty 1

## 2024-03-08 MED ORDER — IPRATROPIUM-ALBUTEROL 0.5-2.5 (3) MG/3ML IN SOLN
3.0000 mL | Freq: Four times a day (QID) | RESPIRATORY_TRACT | Status: DC | PRN
Start: 2024-03-08 — End: 2024-03-09

## 2024-03-08 MED ORDER — HEPARIN (PORCINE) 25000 UT/250ML-% IV SOLN
750.0000 [IU]/h | INTRAVENOUS | Status: DC
  Filled 2024-03-08: qty 250

## 2024-03-08 MED ORDER — APIXABAN 2.5 MG PO TABS
2.5000 mg | ORAL_TABLET | Freq: Two times a day (BID) | ORAL | Status: DC
  Administered 2024-03-08 – 2024-03-09 (×3): 2.5 mg via ORAL
  Filled 2024-03-08 (×4): qty 1

## 2024-03-08 MED ORDER — METOPROLOL TARTRATE 5 MG/5ML IV SOLN
5.0000 mg | INTRAVENOUS | Status: DC | PRN
  Administered 2024-03-08: 5 mg via INTRAVENOUS
  Filled 2024-03-08: qty 5

## 2024-03-08 MED ORDER — ALLOPURINOL 100 MG PO TABS
50.0000 mg | ORAL_TABLET | Freq: Every day | ORAL | Status: DC
  Administered 2024-03-09: 50 mg via ORAL
  Filled 2024-03-08: qty 1

## 2024-03-08 MED ORDER — SENNOSIDES-DOCUSATE SODIUM 8.6-50 MG PO TABS: 1.0000 | ORAL_TABLET | Freq: Every evening | ORAL | Status: DC | PRN

## 2024-03-08 NOTE — ED Notes (Signed)
 3rd trop sent at this time. Second line started. Pt A&Ox4. Call light within reach. Ambulatory with minimal assistance

## 2024-03-08 NOTE — ED Notes (Signed)
 Cardiologist at bedside.
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
## 2024-03-08 NOTE — ED Provider Notes (Addendum)
 [facility] Provider Note    Event Date/Time   First MD Initiated Contact with Patient 03/08/24 [phone]     (approximate)   History   Chief Complaint: Chest Pain   HPI  Timothy Goodwin is a 89 y.o. male with a history of hypertension, atrial fibrillation, CKD who comes to the ED due to chest pressure and racing heartbeat.  He was in his usual state of health until 9 PM last night when symptoms started.  Continuous all night.  This morning he called EMS who found his heart rate to be 160-200 and A-fib, gave 10 mg IV diltiazem with improvement down to about 120.  Patient reports that his chest pressure has resolved.  No shortness of breath diaphoresis vomiting, no radiation.  No exertional or pleuritic symptoms.  Reports has been compliant with his medications, but also reports that after his most recent hospitalization his metoprolol  was discontinued.  Review of recent hospital discharge summary from February 23, 2024 shows that at hospital discharge he was to continue his previous home dose of metoprolol  25 mg twice daily.  He has been compliant with Eliquis .         Past Medical History:  Diagnosis Date   Actinic keratosis    Basal cell carcinoma 11/02/2019   upper back spinal    Carotid artery stenosis    CKD (chronic kidney disease), stage III (HCC)    Coronary artery disease    Gout    Hx of CABG 04/05/2018   2v; LIMA-LAD, SVG-PL   Hyperlipidemia    Hypertension    Left inguinal hernia    NSTEMI (non-ST elevated myocardial infarction) (HCC) 12/25/2013   NSTEMI (non-ST elevated myocardial infarction) (HCC) 03/30/2018   Peripheral vascular disease    Postoperative atrial fibrillation (HCC)    Prostate cancer (HCC) 1995   Right renal artery stenosis     Current Outpatient Rx   Order #: [id number] Class: Historical Med   Order #: [id number] Class: Historical Med   Order #: [id number] Class: Normal   Order #: [id number] Class: Historical Med    Order #: [id number] Class: Historical Med   Order #: [id number] Class: Historical Med   Order #: [id number] Class: Historical Med   Order #: [id number] Class: Historical Med   Order #: [id number] Class: Historical Med   Order #: [id number] Class: Historical Med   Order #: [id number] Class: Historical Med   Order #: [id number] Class: Historical Med    Past Surgical History:  Procedure Laterality Date   CARDIAC CATHETERIZATION N/A 12/27/2013   Location: [facility]; Surgeon: Margie Lovelace, MD   CORONARY ANGIOPLASTY WITH STENT PLACEMENT N/A 2000   OM1 stents (unknown type) placed; Location: [facility]   CORONARY ARTERY BYPASS GRAFT N/A 04/05/2018   2v; LIMA-LAD, SVG-PL; Location: [facility]; Surgeon: Morene Keens, MD   LEFT HEART CATH AND CORONARY ANGIOGRAPHY N/A 03/30/2018   Procedure: LEFT HEART CATH AND CORONARY ANGIOGRAPHY and possible PCI and stent;  Surgeon: Lovelace Cara BIRCH, MD;  Location: [facility] INVASIVE CV [facility];  Service: Cardiovascular;  Laterality: N/A;   PROSTATECTOMY  1995   SMALL BOWEL ENTEROSCOPY  2019   TEE WITH CARDIOVERSION N/A 04/10/2018   Required 3 cardioversions/shocks to briefly restore NSR; A.fib refractory; Location: [facility]   TONSILLECTOMY     XI ROBOTIC ASSISTED INGUINAL HERNIA REPAIR WITH MESH Left 10/30/2020   Procedure: XI ROBOTIC ASSISTED INGUINAL HERNIA REPAIR WITH MESH;  Surgeon: Lane Shope, MD;  Location: [facility];  Service: General;  Laterality: Left;    Physical Exam  Triage Vital Signs: ED Triage Vitals [03/08/24 0707]  Encounter Vitals Group     BP 107/81     Girls Systolic BP Percentile      Girls Diastolic BP Percentile      Boys Systolic BP Percentile      Boys Diastolic BP Percentile      Pulse Rate (!) 109     Resp 18     Temp 98.5 F (36.9 C)     Temp Source Oral     SpO2 98 %     Weight 141 lb 1.5 oz (64 kg)     Height 5' 3 (1.6 m)     Head Circumference      Peak Flow      Pain Score 0     Pain Loc      Pain Education      Exclude from  Growth Chart     Most recent vital signs: Vitals:   03/08/24 0745 03/08/24 1100  BP:  (!) 139/98  Pulse: 73 72  Resp: 19 20  Temp:    SpO2: 96% 95%    General: Awake, no distress.  CV:  Good peripheral perfusion.  Irregular rhythm, tachycardia heart rate 120.  Normal distal pulses Resp:  Normal effort.  Clear lungs Abd:  No distention.  Soft nontender Other:  No lower extremity edema, no calf tenderness.   ED Results / Procedures / Treatments   Labs (all labs ordered are listed, but only abnormal results are displayed) Labs Reviewed  CBC WITH DIFFERENTIAL/PLATELET - Abnormal; Notable for the following components:      Result Value   RBC 3.88 (*)    Hemoglobin 12.0 (*)    HCT 36.7 (*)    Lymphs Abs 0.4 (*)    All other components within normal limits  BASIC METABOLIC PANEL WITH GFR - Abnormal; Notable for the following components:   CO2 19 (*)    BUN 32 (*)    Creatinine, Ser 1.89 (*)    Calcium  7.6 (*)    GFR, Estimated 34 (*)    All other components within normal limits  TROPONIN T, HIGH SENSITIVITY - Abnormal; Notable for the following components:   Troponin T High Sensitivity 77 (*)    All other components within normal limits  TROPONIN T, HIGH SENSITIVITY - Abnormal; Notable for the following components:   Troponin T High Sensitivity 116 (*)    All other components within normal limits  MAGNESIUM   APTT  PROTIME-INR  HEPARIN  LEVEL (UNFRACTIONATED)     EKG Interpreted by me Atrial fibrillation, rate of 124.  Normal axis, normal intervals.  Normal QRS and ST segments.  Inferolateral T wave inversions.   RADIOLOGY Chest x-ray interpreted by me, unremarkable.  Chronic elevation of left hemidiaphragm.  Lungs clear no pneumothorax infiltrate or effusion.  Median sternotomy wires present.  Radiology report reviewed   PROCEDURES:  .Critical Care  Performed by: Viviann Pastor, MD Authorized by: Viviann Pastor, MD   Critical care provider statement:     Critical care time (minutes):  35   Critical care time was exclusive of:  Separately billable procedures and treating other patients   Critical care was necessary to treat or prevent imminent or life-threatening deterioration of the following conditions:  Cardiac failure and circulatory failure   Critical care was time spent personally by me on the following activities:  Development of treatment plan with patient or surrogate, discussions with consultants, evaluation of patient's response to treatment,  examination of patient, obtaining history from patient or surrogate, ordering and performing treatments and interventions, ordering and review of laboratory studies, ordering and review of radiographic studies, pulse oximetry, re-evaluation of patient's condition and review of old charts   Care discussed with: admitting provider      MEDICATIONS ORDERED IN ED: Medications  metoprolol  tartrate (LOPRESSOR ) injection 5 mg (5 mg Intravenous Given 03/08/24 0720)  heparin  ADULT infusion 100 units/mL (25000 units/250mL) (has no administration in time range)  metoprolol  tartrate (LOPRESSOR ) tablet 25 mg (25 mg Oral Given 03/08/24 0751)  sodium chloride  0.9 % bolus 500 mL (500 mLs Intravenous New Bag/Given 03/08/24 1255)     IMPRESSION / MDM / ASSESSMENT AND PLAN / ED COURSE  I reviewed the triage vital signs and the nursing notes.  DDx: Uncontrolled atrial fibrillation, electrolyte derangement, anemia, non-STEMI, AKI  Patient's presentation is most consistent with acute presentation with potential threat to life or bodily function.  Patient presents with rapid atrial fibrillation associated with chest pressure.  I think this is primarily uncontrolled A-fib with severe tachycardia causing rate related demand ischemia.  Symptoms have resolved after some moderate rate control with low-dose diltiazem.  Blood pressure is normal.  Doubt PE, dissection, pericardial effusion, acute CHF.  Will check labs,  give IV metoprolol  and restart oral metoprolol .   Clinical Course as of 03/08/24 1331  Wed Mar 08, 2024  1327 Troponin up Trending.  A-fib continues to be poorly controlled, heart rate 100-120 at rest.  Will start heparin  and plan to hospitalize for further evaluation. [PS]  1331 Case d/w hospitalist [PS]    Clinical Course User Index [PS] Viviann Pastor, MD     FINAL CLINICAL IMPRESSION(S) / ED DIAGNOSES   Final diagnoses:  Atrial fibrillation with RVR (HCC)  Elevated troponin     Rx / DC Orders   ED Discharge Orders     None        Note:  This document was prepared using Dragon voice recognition software and may include unintentional dictation errors.   Viviann Pastor, MD 03/08/24 1329    Viviann Pastor, MD 03/08/24 [phone]
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
## 2024-03-08 NOTE — Progress Notes (Signed)
*  PRELIMINARY RESULTS* Echocardiogram 2D Echocardiogram has been performed.  Floydene Harder 03/08/2024, 2:36 PM

## 2024-03-08 NOTE — ED Notes (Signed)
 Viviann, MD, made aware of troponin 77

## 2024-03-08 NOTE — ED Notes (Signed)
 Called CCMD for central monitoring at this time

## 2024-03-09 DIAGNOSIS — R7989 Other specified abnormal findings of blood chemistry: Secondary | ICD-10-CM | POA: Diagnosis not present

## 2024-03-09 DIAGNOSIS — I4892 Unspecified atrial flutter: Secondary | ICD-10-CM | POA: Diagnosis not present

## 2024-03-09 DIAGNOSIS — I10 Essential (primary) hypertension: Secondary | ICD-10-CM

## 2024-03-09 DIAGNOSIS — N1832 Chronic kidney disease, stage 3b: Secondary | ICD-10-CM

## 2024-03-09 DIAGNOSIS — I5A Non-ischemic myocardial injury (non-traumatic): Secondary | ICD-10-CM

## 2024-03-09 LAB — BASIC METABOLIC PANEL WITH GFR
Anion gap: 12 (ref 5–15)
BUN: 29 mg/dL — ABNORMAL HIGH (ref 8–23)
CO2: 23 mmol/L (ref 22–32)
Calcium: 8.3 mg/dL — ABNORMAL LOW (ref 8.9–10.3)
Chloride: 108 mmol/L (ref 98–111)
Creatinine, Ser: 1.98 mg/dL — ABNORMAL HIGH (ref 0.61–1.24)
GFR, Estimated: 32 mL/min — ABNORMAL LOW (ref >60)
Glucose, Bld: 112 mg/dL — ABNORMAL HIGH (ref 70–99)
Potassium: 4.4 mmol/L (ref 3.5–5.1)
Sodium: 142 mmol/L (ref 135–145)

## 2024-03-09 LAB — CBC
HCT: 35.5 % — ABNORMAL LOW (ref 39.0–52.0)
Hemoglobin: 11.9 g/dL — ABNORMAL LOW (ref 13.0–17.0)
MCH: 31.2 pg (ref 26.0–34.0)
MCHC: 33.5 g/dL (ref 30.0–36.0)
MCV: 92.9 fL (ref 80.0–100.0)
Platelets: 199 K/uL (ref 150–400)
RBC: 3.82 MIL/uL — ABNORMAL LOW (ref 4.22–5.81)
RDW: 13.1 % (ref 11.5–15.5)
WBC: 6.5 K/uL (ref 4.0–10.5)
nRBC: 0 % (ref 0.0–0.2)

## 2024-03-09 MED ORDER — AMIODARONE HCL 200 MG PO TABS: ORAL_TABLET | ORAL | 0 refills | Status: DC

## 2024-03-09 MED ORDER — METOPROLOL TARTRATE 25 MG PO TABS: 12.5000 mg | ORAL_TABLET | Freq: Two times a day (BID) | ORAL | Status: DC

## 2024-03-09 MED ORDER — AMLODIPINE BESYLATE 10 MG PO TABS: 10.0000 mg | ORAL_TABLET | Freq: Every day | ORAL | 0 refills | Status: DC

## 2024-03-09 MED ORDER — AMLODIPINE BESYLATE 5 MG PO TABS
5.0000 mg | ORAL_TABLET | Freq: Every day | ORAL | Status: DC
  Administered 2024-03-09: 5 mg via ORAL
  Filled 2024-03-09: qty 1

## 2024-03-09 MED ORDER — AMLODIPINE BESYLATE 5 MG PO TABS
5.0000 mg | ORAL_TABLET | Freq: Once | ORAL | Status: AC
  Administered 2024-03-09: 5 mg via ORAL
  Filled 2024-03-09: qty 1

## 2024-03-09 MED ORDER — AMIODARONE HCL 200 MG PO TABS
200.0000 mg | ORAL_TABLET | Freq: Two times a day (BID) | ORAL | Status: DC
  Administered 2024-03-09: 200 mg via ORAL
  Filled 2024-03-09: qty 1

## 2024-03-09 MED ORDER — AMIODARONE HCL 200 MG PO TABS: 200.0000 mg | ORAL_TABLET | Freq: Every day | ORAL | Status: DC

## 2024-03-09 MED ORDER — AMLODIPINE BESYLATE 10 MG PO TABS: 10.0000 mg | ORAL_TABLET | Freq: Every day | ORAL | Status: DC

## 2024-03-09 NOTE — Assessment & Plan Note (Signed)
 Creatinine 1.98 with a GFR 32 upon discharge

## 2024-03-09 NOTE — TOC CM/SW Note (Signed)
 Transition of Care [facility]) - Inpatient Brief Assessment   Patient Details  Name: Timothy Goodwin MRN: [id number] Date of Birth: February 20, 1935  Transition of Care [facility]) CM/SW Contact:    Lauraine JAYSON Carpen, LCSW Phone Number: 03/09/2024, 1:58 PM   Clinical Narrative: Patient has orders to discharge home today. Chart reviewed. No TOC needs identified. CSW signing off.  Transition of Care Asessment: Insurance and Status: Insurance coverage has been reviewed Patient has primary care physician: Yes Home environment has been reviewed: [facility] Prior level of function:: Independent per PT note on 11/7. Prior/Current Home Services: No current home services Social Drivers of Health Review: SDOH reviewed no interventions necessary Readmission risk has been reviewed: Yes Transition of care needs: no transition of care needs at this time
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
## 2024-03-09 NOTE — Plan of Care (Signed)
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
## 2024-03-09 NOTE — Care Management Obs Status (Signed)
 MEDICARE OBSERVATION STATUS NOTIFICATION   Patient Details  Name: Timothy Goodwin MRN: [id number] Date of Birth: Jul 12, 1934   Medicare Observation Status Notification Given:  Yes    Rojelio SHAUNNA Rattler 03/09/2024, 11:49 AM
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
## 2024-03-09 NOTE — Discharge Instructions (Signed)
 Take your blood pressure at home and keep a log. If BP still greater than 160 systolic in three days can go up to three times a day with the clonidine 

## 2024-03-09 NOTE — Assessment & Plan Note (Signed)
 Norvasc  increased to 10 mg daily and patient on clonidine  and metoprolol .  Patient has a blood pressure cuff at home.  He states his blood pressure is always in the 170s.  If blood pressure still above 160 in a couple days can increase clonidine  to 3 times a day.

## 2024-03-09 NOTE — Assessment & Plan Note (Signed)
 Patient was started on amiodarone drip and converted over to sinus bradycardia.  Amiodarone drip stopped overnight and started on oral amiodarone this morning.  Patient also on Toprol -XL and Eliquis .

## 2024-03-09 NOTE — Assessment & Plan Note (Signed)
 Elevated troponin secondary to demand ischemia with rapid heart rate.

## 2024-04-06 ENCOUNTER — Observation Stay
Admission: EM | Admit: 2024-04-06 | Discharge: 2024-04-08 | Disposition: A | Discharge status: Home / Self Care | Attending: Hospitalist | Admitting: Hospitalist

## 2024-04-06 ENCOUNTER — Encounter: Payer: Self-pay | Admitting: Emergency Medicine

## 2024-04-06 ENCOUNTER — Emergency Department

## 2024-04-06 ENCOUNTER — Other Ambulatory Visit: Payer: Self-pay

## 2024-04-06 DIAGNOSIS — I4891 Unspecified atrial fibrillation: Secondary | ICD-10-CM | POA: Insufficient documentation

## 2024-04-06 DIAGNOSIS — E785 Hyperlipidemia, unspecified: Secondary | ICD-10-CM | POA: Insufficient documentation

## 2024-04-06 DIAGNOSIS — I959 Hypotension, unspecified: Secondary | ICD-10-CM | POA: Insufficient documentation

## 2024-04-06 DIAGNOSIS — Z794 Long term (current) use of insulin: Secondary | ICD-10-CM | POA: Insufficient documentation

## 2024-04-06 DIAGNOSIS — Z7982 Long term (current) use of aspirin: Secondary | ICD-10-CM | POA: Diagnosis not present

## 2024-04-06 DIAGNOSIS — N1832 Chronic kidney disease, stage 3b: Secondary | ICD-10-CM | POA: Insufficient documentation

## 2024-04-06 DIAGNOSIS — Z951 Presence of aortocoronary bypass graft: Secondary | ICD-10-CM | POA: Insufficient documentation

## 2024-04-06 DIAGNOSIS — D075 Carcinoma in situ of prostate: Secondary | ICD-10-CM | POA: Insufficient documentation

## 2024-04-06 DIAGNOSIS — E1122 Type 2 diabetes mellitus with diabetic chronic kidney disease: Secondary | ICD-10-CM | POA: Diagnosis not present

## 2024-04-06 DIAGNOSIS — R55 Syncope and collapse: Principal | ICD-10-CM | POA: Insufficient documentation

## 2024-04-06 DIAGNOSIS — E1165 Type 2 diabetes mellitus with hyperglycemia: Secondary | ICD-10-CM | POA: Diagnosis not present

## 2024-04-06 DIAGNOSIS — S12600A Unspecified displaced fracture of seventh cervical vertebra, initial encounter for closed fracture: Secondary | ICD-10-CM | POA: Diagnosis not present

## 2024-04-06 DIAGNOSIS — S065XAA Traumatic subdural hemorrhage with loss of consciousness status unknown, initial encounter: Secondary | ICD-10-CM | POA: Diagnosis not present

## 2024-04-06 DIAGNOSIS — C61 Malignant neoplasm of prostate: Secondary | ICD-10-CM | POA: Diagnosis present

## 2024-04-06 DIAGNOSIS — W19XXXA Unspecified fall, initial encounter: Secondary | ICD-10-CM | POA: Insufficient documentation

## 2024-04-06 DIAGNOSIS — S065X0A Traumatic subdural hemorrhage without loss of consciousness, initial encounter: Secondary | ICD-10-CM | POA: Insufficient documentation

## 2024-04-06 DIAGNOSIS — N179 Acute kidney failure, unspecified: Secondary | ICD-10-CM | POA: Insufficient documentation

## 2024-04-06 DIAGNOSIS — R2681 Unsteadiness on feet: Secondary | ICD-10-CM | POA: Insufficient documentation

## 2024-04-06 DIAGNOSIS — J90 Pleural effusion, not elsewhere classified: Secondary | ICD-10-CM | POA: Insufficient documentation

## 2024-04-06 DIAGNOSIS — R918 Other nonspecific abnormal finding of lung field: Secondary | ICD-10-CM | POA: Insufficient documentation

## 2024-04-06 DIAGNOSIS — R748 Abnormal levels of other serum enzymes: Secondary | ICD-10-CM

## 2024-04-06 DIAGNOSIS — I700 Atherosclerosis of aorta: Secondary | ICD-10-CM | POA: Diagnosis not present

## 2024-04-06 DIAGNOSIS — R7401 Elevation of levels of liver transaminase levels: Secondary | ICD-10-CM | POA: Insufficient documentation

## 2024-04-06 DIAGNOSIS — Z79899 Other long term (current) drug therapy: Secondary | ICD-10-CM | POA: Insufficient documentation

## 2024-04-06 DIAGNOSIS — Z8042 Family history of malignant neoplasm of prostate: Secondary | ICD-10-CM | POA: Insufficient documentation

## 2024-04-06 DIAGNOSIS — I129 Hypertensive chronic kidney disease with stage 1 through stage 4 chronic kidney disease, or unspecified chronic kidney disease: Secondary | ICD-10-CM | POA: Insufficient documentation

## 2024-04-06 DIAGNOSIS — K8020 Calculus of gallbladder without cholecystitis without obstruction: Secondary | ICD-10-CM | POA: Insufficient documentation

## 2024-04-06 DIAGNOSIS — I4892 Unspecified atrial flutter: Secondary | ICD-10-CM | POA: Insufficient documentation

## 2024-04-06 LAB — CBC
HCT: 30 % — ABNORMAL LOW (ref 39.0–52.0)
Hemoglobin: 10.2 g/dL — ABNORMAL LOW (ref 13.0–17.0)
MCH: 31.2 pg (ref 26.0–34.0)
MCHC: 34 g/dL (ref 30.0–36.0)
MCV: 91.7 fL (ref 80.0–100.0)
Platelets: 155 K/uL (ref 150–400)
RBC: 3.27 MIL/uL — ABNORMAL LOW (ref 4.22–5.81)
RDW: 14.2 % (ref 11.5–15.5)
WBC: 5.8 K/uL (ref 4.0–10.5)
nRBC: 0 % (ref 0.0–0.2)

## 2024-04-06 LAB — RESP PANEL BY RT-PCR (RSV, FLU A&B, COVID)  RVPGX2
Influenza A by PCR: NEGATIVE
Influenza B by PCR: NEGATIVE
Resp Syncytial Virus by PCR: NEGATIVE
SARS Coronavirus 2 by RT PCR: NEGATIVE

## 2024-04-06 LAB — COMPREHENSIVE METABOLIC PANEL WITH GFR
ALT: 166 U/L — ABNORMAL HIGH (ref 0–44)
AST: 117 U/L — ABNORMAL HIGH (ref 15–41)
Albumin: 3.2 g/dL — ABNORMAL LOW (ref 3.5–5.0)
Alkaline Phosphatase: 171 U/L — ABNORMAL HIGH (ref 38–126)
Anion gap: 11 (ref 5–15)
BUN: 55 mg/dL — ABNORMAL HIGH (ref 8–23)
CO2: 20 mmol/L — ABNORMAL LOW (ref 22–32)
Calcium: 8.5 mg/dL — ABNORMAL LOW (ref 8.9–10.3)
Chloride: 107 mmol/L (ref 98–111)
Creatinine, Ser: 2.59 mg/dL — ABNORMAL HIGH (ref 0.61–1.24)
GFR, Estimated: 23 mL/min — ABNORMAL LOW (ref >60)
Glucose, Bld: 177 mg/dL — ABNORMAL HIGH (ref 70–99)
Potassium: 3.8 mmol/L (ref 3.5–5.1)
Sodium: 138 mmol/L (ref 135–145)
Total Bilirubin: 0.8 mg/dL (ref 0.0–1.2)
Total Protein: 5.5 g/dL — ABNORMAL LOW (ref 6.5–8.1)

## 2024-04-06 LAB — URINALYSIS, ROUTINE W REFLEX MICROSCOPIC
Bilirubin Urine: NEGATIVE
Glucose, UA: NEGATIVE mg/dL
Hgb urine dipstick: NEGATIVE
Ketones, ur: NEGATIVE mg/dL
Leukocytes,Ua: NEGATIVE
Nitrite: NEGATIVE
Protein, ur: 30 mg/dL — AB
Specific Gravity, Urine: 1.016 (ref 1.005–1.030)
pH: 5 (ref 5.0–8.0)

## 2024-04-06 LAB — TROPONIN T, HIGH SENSITIVITY
Troponin T High Sensitivity: 63 ng/L — ABNORMAL HIGH (ref 0–19)
Troponin T High Sensitivity: 54 ng/L — ABNORMAL HIGH (ref 0–19)

## 2024-04-06 MED ORDER — ACETAMINOPHEN 650 MG RE SUPP: 650.0000 mg | Freq: Four times a day (QID) | RECTAL | Status: DC | PRN

## 2024-04-06 MED ORDER — SODIUM CHLORIDE 0.9 % IV SOLN: INTRAVENOUS | Status: DC

## 2024-04-06 MED ORDER — METOPROLOL TARTRATE 25 MG PO TABS
12.5000 mg | ORAL_TABLET | Freq: Two times a day (BID) | ORAL | Status: DC
  Administered 2024-04-06 – 2024-04-08 (×4): 12.5 mg via ORAL
  Filled 2024-04-06 (×4): qty 1

## 2024-04-06 MED ORDER — ONDANSETRON HCL 4 MG PO TABS: 4.0000 mg | ORAL_TABLET | Freq: Four times a day (QID) | ORAL | Status: DC | PRN

## 2024-04-06 MED ORDER — ACETAMINOPHEN 325 MG PO TABS: 650.0000 mg | ORAL_TABLET | Freq: Four times a day (QID) | ORAL | Status: DC | PRN

## 2024-04-06 MED ORDER — SODIUM CHLORIDE 0.9 % IV BOLUS
500.0000 mL | Freq: Once | INTRAVENOUS | Status: AC
  Administered 2024-04-06: 12:00:00 500 mL via INTRAVENOUS

## 2024-04-06 MED ORDER — METOPROLOL TARTRATE 25 MG PO TABS: 12.5000 mg | ORAL_TABLET | Freq: Two times a day (BID) | ORAL | Status: DC

## 2024-04-06 MED ORDER — AMLODIPINE BESYLATE 5 MG PO TABS: 10.0000 mg | ORAL_TABLET | Freq: Every day | ORAL | Status: DC

## 2024-04-06 MED ORDER — FENTANYL CITRATE (PF) 50 MCG/ML IJ SOSY: 12.5000 ug | PREFILLED_SYRINGE | INTRAMUSCULAR | Status: DC | PRN

## 2024-04-06 MED ORDER — ALLOPURINOL 100 MG PO TABS
100.0000 mg | ORAL_TABLET | Freq: Every day | ORAL | Status: DC
  Administered 2024-04-06 – 2024-04-08 (×3): 100 mg via ORAL
  Filled 2024-04-06 (×3): qty 1

## 2024-04-06 MED ORDER — POLYETHYLENE GLYCOL 3350 17 G PO PACK: 17.0000 g | PACK | Freq: Every day | ORAL | Status: DC | PRN

## 2024-04-06 MED ORDER — OXYCODONE HCL 5 MG PO TABS: 5.0000 mg | ORAL_TABLET | ORAL | Status: DC | PRN

## 2024-04-06 MED ORDER — ONDANSETRON HCL 4 MG/2ML IJ SOLN: 4.0000 mg | Freq: Four times a day (QID) | INTRAMUSCULAR | Status: DC | PRN

## 2024-04-06 MED ORDER — AMIODARONE HCL 200 MG PO TABS
200.0000 mg | ORAL_TABLET | Freq: Every day | ORAL | Status: DC
  Administered 2024-04-06 – 2024-04-08 (×3): 200 mg via ORAL
  Filled 2024-04-06 (×3): qty 1

## 2024-04-06 NOTE — ED Triage Notes (Addendum)
 Pt via [facility] from [facility] independent living. Pt c/o syncopal episode, reports he went to stand up and woke up on the floor. Pt states he has had 2 falls. Pt c/o L rib pain. Pt has an abrasion to the L side of his head, bruising to the L lip, and L ear. Pt has hx of afib and takes Eliquis  at home. Denies any head pain. Pt is A&Ox4 and NAD.

## 2024-04-06 NOTE — Consult Note (Signed)
 Consulting Department:  Emergency department  Primary Physician:  [name], MD  Chief Complaint: Fall and head trauma.  History of Present Illness: 04/06/2024 Timothy Goodwin is a 89 y.o. male who presents with the chief complaint of a fall with SDH. No headaches. No seizures. Did have syncope this week so came in for evaluation.  He states that he is not having any new neurologic symptoms.  No numbness weakness or tingling.  States that he does not know why he is having syncopal episodes.  Review of Systems:  A 10 point review of systems is negative, except for the pertinent positives and negatives detailed in the HPI.  Past Medical History: Past Medical History:  Diagnosis Date   Actinic keratosis    Basal cell carcinoma 11/02/2019   upper back spinal    Carotid artery stenosis    CKD (chronic kidney disease), stage III (HCC)    Coronary artery disease    Gout    Hx of CABG 04/05/2018   2v; LIMA-LAD, SVG-PL   Hyperlipidemia    Hypertension    Left inguinal hernia    NSTEMI (non-ST elevated myocardial infarction) (HCC) 12/25/2013   NSTEMI (non-ST elevated myocardial infarction) (HCC) 03/30/2018   Peripheral vascular disease    Postoperative atrial fibrillation (HCC)    Prostate cancer (HCC) 1995   Right renal artery stenosis     Past Surgical History: Past Surgical History:  Procedure Laterality Date   CARDIAC CATHETERIZATION N/A 12/27/2013   Location: [facility]; Surgeon: Margie Lovelace, MD   CORONARY ANGIOPLASTY WITH STENT PLACEMENT N/A 2000   OM1 stents (unknown type) placed; Location: [facility]   CORONARY ARTERY BYPASS GRAFT N/A 04/05/2018   2v; LIMA-LAD, SVG-PL; Location: [facility]; Surgeon: Morene Keens, MD   LEFT HEART CATH AND CORONARY ANGIOGRAPHY N/A 03/30/2018   Procedure: LEFT HEART CATH AND CORONARY ANGIOGRAPHY and possible PCI and stent;  Surgeon: Lovelace Cara BIRCH, MD;  Location: [facility] INVAS[facility];  Service: Cardiovascular;  Laterality:  N/A;   PROSTATECTOMY  1995   SMALL BOWEL ENTEROSCOPY  2019   TEE WITH CARDIOVERSION N/A 04/10/2018   Required 3 cardioversions/shocks to briefly restore NSR; A.fib refractory; Location: [facility]   TONSILLECTOMY     XI ROBOTIC ASSISTED INGUINAL HERNIA REPAIR WITH MESH Left 10/30/2020   Procedure: XI ROBOTIC ASSISTED INGUINAL HERNIA REPAIR WITH MESH;  Surgeon: Lane Shope, MD;  Location: [facility];  Service: General;  Laterality: Left;    Allergies: Allergies as of 04/06/2024 - Review Complete 04/06/2024  Allergen Reaction Noted   Aspirin  Shortness Of Breath and Swelling    Lisinopril Shortness Of Breath    Ace inhibitors Other (See Comments) 05/17/2019   Nsaids Swelling 03/21/2012   Sulfa antibiotics Swelling and Rash 03/21/2012    Medications: Current Medications[1]   Social History: Social History[2]  Family Medical History: Family History  Problem Relation Age of Onset   Prostate cancer Father     Physical Examination: Vitals:   04/06/24 1217 04/06/24 1220  BP: 103/67   Pulse: 83   Resp: 19   Temp:  97.6 F (36.4 C)  SpO2: 99%      General: Patient is well developed, well nourished, calm, collected, and in no apparent distress.  NEUROLOGICAL:  General: In no acute distress.   Awake, alert, oriented to person, place, and time.  Pupils equal round and reactive to light.  Full Facial tone is symmetric.  Tongue protrusion is midline.  Bilateral upper extremities are full strength proximally  and distally.  There is no pronator drift.  Language is conversant.  GCS: 15   Bilateral upper and lower extremity sensation is intact to light touch.  Imaging: CT Cervical Spine Wo Contrast Result Date: 04/06/2024 CLINICAL DATA:  Syncopal episode with fall. EXAM: CT CERVICAL SPINE WITHOUT CONTRAST TECHNIQUE: Multidetector CT imaging of the cervical spine was performed without intravenous contrast. Multiplanar CT image reconstructions were also generated. RADIATION DOSE  REDUCTION: This exam was performed according to the departmental dose-optimization program which includes automated exposure control, adjustment of the mA and/or kV according to patient size and/or use of iterative reconstruction technique. COMPARISON:  02/23/2024 FINDINGS: Alignment: No posttraumatic subluxation. Subtle stable degenerative posterior subluxation of C3 on C4 and of C5 and C6. Skull base and vertebrae: Moderate spondylosis of the cervical spine to include uncovertebral joint spurring and facet arthropathy. Vertebral body heights are maintained. Stable minimally displaced left C7 transverse process fracture. No additional fractures visualized. Moderate bilateral neural foraminal narrowing at the C3-4 level, C4-5 and C5-6 levels and to lesser extent at the C6-7 level. Soft tissues and spinal canal: No prevertebral fluid or swelling. No visible canal hematoma. Disc levels: Moderate disc space narrowing from the C3-4 level to the C6-7 level. Upper chest: No acute findings. Other: Old left posterior first and second rib fractures. IMPRESSION: 1. Stable minimally displaced left C7 transverse process fracture. No additional fractures visualized. 2. Moderate spondylosis of the cervical spine with multilevel disc disease and bilateral neural foraminal narrowing as described. Electronically Signed   By: Toribio Agreste M.D.   On: 04/06/2024 12:15   CT Chest Wo Contrast Result Date: 04/06/2024 CLINICAL DATA:  Left rib pain after multiple falls EXAM: CT CHEST WITHOUT CONTRAST TECHNIQUE: Multidetector CT imaging of the chest was performed following the standard protocol without IV contrast. RADIATION DOSE REDUCTION: This exam was performed according to the departmental dose-optimization program which includes automated exposure control, adjustment of the mA and/or kV according to patient size and/or use of iterative reconstruction technique. COMPARISON:  February 23, 2024 FINDINGS: Cardiovascular: Status post  coronary artery bypass graft. Aortic atherosclerosis. No evidence of thoracic aortic aneurysm. Normal cardiac size. No pericardial effusion. Mediastinum/Nodes: No enlarged mediastinal or axillary lymph nodes. Thyroid gland, trachea, and esophagus demonstrate no significant findings. Lungs/Pleura: No pneumothorax is noted. Minimal left pleural effusion is noted with associated mild left posterior basilar atelectasis or infiltrate. 5 mm nodule is noted in right middle lobe best seen on image number 115 of series 3. This is unchanged compared to prior exam. 3 mm nodule is noted in left upper lobe best seen on image number 86 of series 3. This is also unchanged. Upper Abdomen: No acute abnormality. Musculoskeletal: No chest wall mass or suspicious bone lesions identified. IMPRESSION: 1. Minimal left pleural effusion is noted with associated mild left posterior basilar atelectasis or infiltrate. 2. Stable bilateral pulmonary nodules are noted, the largest measuring 5 mm in the right middle lobe. No follow-up needed if patient is low-risk (and has no known or suspected primary neoplasm). Non-contrast chest CT can be considered in 12 months if patient is high-risk. This recommendation follows the consensus statement: Guidelines for Management of Incidental Pulmonary Nodules Detected on CT Images: From the Fleischner Society 2017; Radiology 2017; 284:228-243. 3. Status post coronary artery bypass graft. 4. Aortic atherosclerosis. Aortic Atherosclerosis (ICD10-I70.0). Electronically Signed   By: Lynwood Landy Raddle M.D.   On: 04/06/2024 12:14   DG Ribs Unilateral W/Chest Left Result Date: 04/06/2024 CLINICAL DATA:  rib injury EXAM: LEFT RIBS AND CHEST - 3+ VIEW COMPARISON:  03/08/2024 FINDINGS: Sternotomy wires and CABG markers. Elevation of the left hemidiaphragm with left basilar atelectasis. Low lung volumes with bronchovascular crowding. No focal airspace consolidation, pleural effusion, or pneumothorax. No cardiomegaly.  Subtle cortical irregularity of the anterior left eighth rib. IMPRESSION: Subtle cortical irregularity of the anterior left eighth rib, worrisome for a nondisplaced fracture. No pneumothorax. Electronically Signed   By: Rogelia Myers M.D.   On: 04/06/2024 11:49   CT HEAD WO CONTRAST Result Date: 04/06/2024 EXAM: CT HEAD WITHOUT CONTRAST 04/06/2024 10:54:37 AM TECHNIQUE: CT of the head was performed without the administration of intravenous contrast. Automated exposure control, iterative reconstruction, and/or weight based adjustment of the mA/kV was utilized to reduce the radiation dose to as low as reasonably achievable. COMPARISON: 02/23/2024 CLINICAL HISTORY: Head trauma, moderate-severe Pt c/o syncopal episode, reports he went to stand up and woke up on the floor. Pt states he has had 2 falls. Pt c/o L rib pain. Pt has an abrasion to the L side of his head, FINDINGS: BRAIN AND VENTRICLES: New 4 mm thick low-density subacute to chronic right subdural hematoma. No acute extraaxial fluid collection. Patchy and confluent areas of decreased attenuation are noted throughout the deep and periventricular white matter of the cerebral hemispheres bilaterally suggestive of chronic microvascular ischemic changes. Cerebral ventricle sizes are concordant with the degree of cerebral volume loss. Atrophy and chronic small vessel disease changes are also noted. No evidence of acute infarct. No hydrocephalus. No mass effect or midline shift. ORBITS: No acute abnormality. SINUSES: No acute abnormality. SOFT TISSUES AND SKULL: No acute soft tissue abnormality. No skull fracture. IMPRESSION: 1. Subacute to chronic 4 mm right subdural hematoma. No acute component identified. 2. No acute findings. Electronically signed by: Morgane Naveau MD 04/06/2024 11:11 AM EST RP Workstation: [id number]     I have personally reviewed the images and agree with the above interpretation.  Labs:    Latest Ref Rng & Units 04/06/2024    10:37 AM 03/09/2024    3:15 AM 03/08/2024    7:25 AM  CBC  WBC 4.0 - 10.5 K/uL 5.8  6.5  6.1   Hemoglobin 13.0 - 17.0 g/dL 89.7  88.0  87.9   Hematocrit 39.0 - 52.0 % 30.0  35.5  36.7   Platelets 150 - 400 K/uL 155  199  188       Latest Ref Rng & Units 04/06/2024   10:37 AM 03/09/2024    3:15 AM 03/08/2024    8:10 AM  BMP  Glucose 70 - 99 mg/dL 822  887  99   BUN 8 - 23 mg/dL 55  29  32   Creatinine 0.61 - 1.24 mg/dL 7.40  8.01  8.10   Sodium 135 - 145 mmol/L 138  142  139   Potassium 3.5 - 5.1 mmol/L 3.8  4.4  4.6   Chloride 98 - 111 mmol/L 107  108  111   CO2 22 - 32 mmol/L 20  23  19    Calcium  8.9 - 10.3 mg/dL 8.5  8.3  7.6         Assessment and Plan: Mr. Bushnell is a pleasant 89 y.o. male with recent history of some falls.  He came in a few days after most recent fall.  He is on Eliquis , last dose was today.  CT scan demonstrated a small subdural hematoma without significant mass effect.  Likely at least multiple days  to weeks old.  No evidence of active extravasation.  On physical examination he shows no focal neurologic deficits.  At this point this does not appear to be a symptomatic hemorrhage.  Will plan to be available if necessary but unlikely to need neurosurgical intervention.  We can continue to follow in clinic.  The subdural hematoma unlikely to be in relation to his syncopal episodes.  Penne MICAEL Sharps, MD/MSCR Dept. of Neurosurgery       [1] No current facility-administered medications for this encounter.  Current Outpatient Medications:    allopurinol  (ZYLOPRIM ) 100 MG tablet, Take 100 mg by mouth daily., Disp: , Rfl:    amiodarone  (PACERONE ) 200 MG tablet, Take 1 tablet (200 mg total) by mouth 2 (two) times daily for 10 days, THEN 1 tablet (200 mg total) daily for 20 days., Disp: 40 tablet, Rfl: 0   amLODipine  (NORVASC ) 10 MG tablet, Take 1 tablet (10 mg total) by mouth daily., Disp: 30 tablet, Rfl: 0   apixaban  (ELIQUIS ) 2.5 MG TABS tablet, Take 1  tablet (2.5 mg total) by mouth 2 (two) times daily., Disp: 60 tablet, Rfl: 0   Ascorbic Acid (VITAMIN C) 1000 MG tablet, Take 1,000 mg by mouth 3 (three) times a week., Disp: , Rfl:    cloNIDine  (CATAPRES ) 0.1 MG tablet, Take 0.1 mg by mouth 2 (two) times daily. , Disp: , Rfl:    Coenzyme Q10 200 MG capsule, Take 200 mg by mouth 3 (three) times a week., Disp: , Rfl:    metoprolol  tartrate (LOPRESSOR ) 25 MG tablet, Take 0.5 tablets (12.5 mg total) by mouth 2 (two) times daily., Disp: , Rfl:    Multiple Vitamin (MULTIVITAMIN) capsule, Take 1 capsule by mouth daily., Disp: , Rfl:    Turmeric 450 MG CAPS, Take 450 mg by mouth 3 (three) times a week., Disp: , Rfl:    vitamin E 200 UNIT capsule, Take 200 Units by mouth daily., Disp: , Rfl:  [2]  Social History Tobacco Use   Smoking status: Never   Smokeless tobacco: Never  Vaping Use   Vaping status: Never Used  Substance Use Topics   Alcohol use: Never   Drug use: No

## 2024-04-06 NOTE — ED Notes (Signed)
 Called [facility] to add pt to monitoring.

## 2024-04-06 NOTE — ED Provider Notes (Signed)
 [facility] Provider Note    Event Date/Time   First MD Initiated Contact with Patient 04/06/24 1047     (approximate)   History   Loss of Consciousness   HPI  Timothy Goodwin is a 89 y.o. male reports he is passed out twice in the last 4 to 5 days.  Not sure exactly when but he reports a few days ago at least he suddenly when he got up felt faint woke up on the floor.  Did not seek medical care.  He noticed that he was little sore and had a bruise over his right forehead after that event.  He continued on as typical, and then today when he got up off the sofa he stood up and awoke on the floor.  Reports he has just a little bit of a scrape on the tip of his nose and his left lip area.  He felt otherwise okay but noticed his blood pressure was a little bit low at home when he checked it in the last day was 100/60 and normally is a little bit higher than that  He had no chest pain no racing heart.  He denies any pain no headache no numbness no weakness no neck pain.  The only exception being he feels a little bit of a catching discomfort over his left rib cage that started a few days ago with the initial fall.   Reviewed discharge summary patient admitted to the hospital for A-fib with RVR hypertension stage III CKD with myocardial injury really November of this year. On anticoagulation.  He is currently also taking Toprol -XL and amiodarone  had increased his Norvasc  dose during previous admission  He does take oral anticoagulant Eliquis , he took it this morning along with his typical medicine  Physical Exam   Triage Vital Signs: ED Triage Vitals  Encounter Vitals Group     BP 04/06/24 1033 96/68     Girls Systolic BP Percentile --      Girls Diastolic BP Percentile --      Boys Systolic BP Percentile --      Boys Diastolic BP Percentile --      Pulse Rate 04/06/24 1033 80     Resp 04/06/24 1033 18     Temp 04/06/24 1033 98 F (36.7 C)     Temp Source  04/06/24 1033 Oral     SpO2 04/06/24 1033 98 %     Weight 04/06/24 1031 155 lb 14.4 oz (70.7 kg)     Height 04/06/24 1031 5' 4 (1.626 m)     Head Circumference --      Peak Flow --      Pain Score 04/06/24 1031 3     Pain Loc --      Pain Education --      Exclude from Growth Chart --     Most recent vital signs: Vitals:   04/06/24 1220 04/06/24 1230  BP:  97/71  Pulse:  83  Resp:  (!) 23  Temp: 97.6 F (36.4 C)   SpO2:  99%     General: Awake, no distress.  Normocephalic atraumatic septi has a slight contusion over the right forehead.  Additionally has a small abrasion to the tip of his nose and slight just below his left lower lip line but there is no lacerations no open wound.  No neck pain.  He is fully alert well-oriented very pleasant he has no complaint at this time CV:  Good peripheral perfusion.  Slightly irregular but no murmur Resp:  Normal effort.  Clear bilateral.  Some point tenderness to palpation just over his left lateral lower rib cage anteriorly.  There is no deformity.  Work of breathing is normal.  There is no bruising or pain to palpation in the abdomen there is no abnormality along the flanks Abd:  No distention.  Soft nontender nondistended Other:  Moves all extremities well except he reports some tenderness in the left mid upper humerus but ranges it well but reports it slightly tender to palpation   ED Results / Procedures / Treatments   Labs (all labs ordered are listed, but only abnormal results are displayed) Labs Reviewed  COMPREHENSIVE METABOLIC PANEL WITH GFR - Abnormal; Notable for the following components:      Result Value   CO2 20 (*)    Glucose, Bld 177 (*)    BUN 55 (*)    Creatinine, Ser 2.59 (*)    Calcium  8.5 (*)    Total Protein 5.5 (*)    Albumin 3.2 (*)    AST 117 (*)    ALT 166 (*)    Alkaline Phosphatase 171 (*)    GFR, Estimated 23 (*)    All other components within normal limits  CBC - Abnormal; Notable for the  following components:   RBC 3.27 (*)    Hemoglobin 10.2 (*)    HCT 30.0 (*)    All other components within normal limits  URINALYSIS, ROUTINE W REFLEX MICROSCOPIC - Abnormal; Notable for the following components:   Color, Urine YELLOW (*)    APPearance CLOUDY (*)    Protein, ur 30 (*)    Bacteria, UA RARE (*)    All other components within normal limits  TROPONIN T, HIGH SENSITIVITY - Abnormal; Notable for the following components:   Troponin T High Sensitivity 63 (*)    All other components within normal limits  TROPONIN T, HIGH SENSITIVITY - Abnormal; Notable for the following components:   Troponin T High Sensitivity 54 (*)    All other components within normal limits  RESP PANEL BY RT-PCR (RSV, FLU A&B, COVID)  [id number]  HEPATITIS PANEL, ACUTE  CBG MONITORING, ED     EKG  Inter by me at 1040 heart rate 85 QRS 115 QTc 470.  Atrial fibrillation, slight artifact.  No evidence of acute ischemia, mild nonspecific T wave abnormality   RADIOLOGY  US  ABDOMEN LIMITED RUQ (LIVER/GB) Result Date: 04/06/2024 CLINICAL DATA:  [id number] Transaminitis [id number] EXAM: ULTRASOUND ABDOMEN LIMITED RIGHT UPPER QUADRANT COMPARISON:  02/23/2024 FINDINGS: Gallbladder: Decompressed containing multiple shadowing gallstones. Circumferential wall thickening with gallbladder wall edema. Small amount of pericholecystic fluid is also present. No sonographic Murphy's sign noted by sonographer. Common bile duct: Diameter: 6 mm Liver: Normal echogenicity. No focal lesion identified. No intrahepatic biliary ductal dilation. Portal vein is patent on color Doppler imaging with normal direction of blood flow towards the liver. Right Kidney: Partially visualized. No mass. No hydronephrosis or nephrolithiasis. Other: None. IMPRESSION: Decompressed gallbladder containing multiple gallstones. Mild gallbladder wall thickening and edema with trace pericholecystic fluid. In the absence of a sonographic Murphy's sign, the study is  equivocal for acute cholecystitis. These changes could be due to acute hepatitis, related to intra-abdominal inflammation, hypoproteinemia, or volume overload from CHF or renal failure. Laboratory correlation recommended. Electronically Signed   By: Rogelia Myers M.D.   On: 04/06/2024 14:10   DG Humerus Left Result Date: 04/06/2024 CLINICAL DATA:  mid shaft pain (mild) after fall EXAM: LEFT HUMERUS - 2+ VIEW COMPARISON:  None Available. FINDINGS: Osteopenia.No acute fracture or dislocation. There is no evidence of arthropathy or other focal bone abnormality. Soft tissues are unremarkable. IMPRESSION: No acute fracture or dislocation. Electronically Signed   By: Rogelia Myers M.D.   On: 04/06/2024 13:34   CT Cervical Spine Wo Contrast Result Date: 04/06/2024 CLINICAL DATA:  Syncopal episode with fall. EXAM: CT CERVICAL SPINE WITHOUT CONTRAST TECHNIQUE: Multidetector CT imaging of the cervical spine was performed without intravenous contrast. Multiplanar CT image reconstructions were also generated. RADIATION DOSE REDUCTION: This exam was performed according to the departmental dose-optimization program which includes automated exposure control, adjustment of the mA and/or kV according to patient size and/or use of iterative reconstruction technique. COMPARISON:  02/23/2024 FINDINGS: Alignment: No posttraumatic subluxation. Subtle stable degenerative posterior subluxation of C3 on C4 and of C5 and C6. Skull base and vertebrae: Moderate spondylosis of the cervical spine to include uncovertebral joint spurring and facet arthropathy. Vertebral body heights are maintained. Stable minimally displaced left C7 transverse process fracture. No additional fractures visualized. Moderate bilateral neural foraminal narrowing at the C3-4 level, C4-5 and C5-6 levels and to lesser extent at the C6-7 level. Soft tissues and spinal canal: No prevertebral fluid or swelling. No visible canal hematoma. Disc levels: Moderate  disc space narrowing from the C3-4 level to the C6-7 level. Upper chest: No acute findings. Other: Old left posterior first and second rib fractures. IMPRESSION: 1. Stable minimally displaced left C7 transverse process fracture. No additional fractures visualized. 2. Moderate spondylosis of the cervical spine with multilevel disc disease and bilateral neural foraminal narrowing as described. Electronically Signed   By: Toribio Agreste M.D.   On: 04/06/2024 12:15   CT Chest Wo Contrast Result Date: 04/06/2024 CLINICAL DATA:  Left rib pain after multiple falls EXAM: CT CHEST WITHOUT CONTRAST TECHNIQUE: Multidetector CT imaging of the chest was performed following the standard protocol without IV contrast. RADIATION DOSE REDUCTION: This exam was performed according to the departmental dose-optimization program which includes automated exposure control, adjustment of the mA and/or kV according to patient size and/or use of iterative reconstruction technique. COMPARISON:  February 23, 2024 FINDINGS: Cardiovascular: Status post coronary artery bypass graft. Aortic atherosclerosis. No evidence of thoracic aortic aneurysm. Normal cardiac size. No pericardial effusion. Mediastinum/Nodes: No enlarged mediastinal or axillary lymph nodes. Thyroid gland, trachea, and esophagus demonstrate no significant findings. Lungs/Pleura: No pneumothorax is noted. Minimal left pleural effusion is noted with associated mild left posterior basilar atelectasis or infiltrate. 5 mm nodule is noted in right middle lobe best seen on image number 115 of series 3. This is unchanged compared to prior exam. 3 mm nodule is noted in left upper lobe best seen on image number 86 of series 3. This is also unchanged. Upper Abdomen: No acute abnormality. Musculoskeletal: No chest wall mass or suspicious bone lesions identified. IMPRESSION: 1. Minimal left pleural effusion is noted with associated mild left posterior basilar atelectasis or infiltrate. 2.  Stable bilateral pulmonary nodules are noted, the largest measuring 5 mm in the right middle lobe. No follow-up needed if patient is low-risk (and has no known or suspected primary neoplasm). Non-contrast chest CT can be considered in 12 months if patient is high-risk. This recommendation follows the consensus statement: Guidelines for Management of Incidental Pulmonary Nodules Detected on CT Images: From the Fleischner Society 2017; Radiology 2017; 284:228-243. 3. Status post coronary artery bypass graft. 4. Aortic atherosclerosis. Aortic Atherosclerosis (  ICD10-I70.0). Electronically Signed   By: Lynwood Landy Raddle M.D.   On: 04/06/2024 12:14   DG Ribs Unilateral W/Chest Left Result Date: 04/06/2024 CLINICAL DATA:  rib injury EXAM: LEFT RIBS AND CHEST - 3+ VIEW COMPARISON:  03/08/2024 FINDINGS: Sternotomy wires and CABG markers. Elevation of the left hemidiaphragm with left basilar atelectasis. Low lung volumes with bronchovascular crowding. No focal airspace consolidation, pleural effusion, or pneumothorax. No cardiomegaly. Subtle cortical irregularity of the anterior left eighth rib. IMPRESSION: Subtle cortical irregularity of the anterior left eighth rib, worrisome for a nondisplaced fracture. No pneumothorax. Electronically Signed   By: Rogelia Myers M.D.   On: 04/06/2024 11:49   CT HEAD WO CONTRAST Result Date: 04/06/2024 EXAM: CT HEAD WITHOUT CONTRAST 04/06/2024 10:54:37 AM TECHNIQUE: CT of the head was performed without the administration of intravenous contrast. Automated exposure control, iterative reconstruction, and/or weight based adjustment of the mA/kV was utilized to reduce the radiation dose to as low as reasonably achievable. COMPARISON: 02/23/2024 CLINICAL HISTORY: Head trauma, moderate-severe Pt c/o syncopal episode, reports he went to stand up and woke up on the floor. Pt states he has had 2 falls. Pt c/o L rib pain. Pt has an abrasion to the L side of his head, FINDINGS: BRAIN AND  VENTRICLES: New 4 mm thick low-density subacute to chronic right subdural hematoma. No acute extraaxial fluid collection. Patchy and confluent areas of decreased attenuation are noted throughout the deep and periventricular white matter of the cerebral hemispheres bilaterally suggestive of chronic microvascular ischemic changes. Cerebral ventricle sizes are concordant with the degree of cerebral volume loss. Atrophy and chronic small vessel disease changes are also noted. No evidence of acute infarct. No hydrocephalus. No mass effect or midline shift. ORBITS: No acute abnormality. SINUSES: No acute abnormality. SOFT TISSUES AND SKULL: No acute soft tissue abnormality. No skull fracture. IMPRESSION: 1. Subacute to chronic 4 mm right subdural hematoma. No acute component identified. 2. No acute findings. Electronically signed by: Morgane Naveau MD 04/06/2024 11:11 AM EST RP Workstation: [id number]      PROCEDURES:  Critical Care performed: No  Procedures   MEDICATIONS ORDERED IN ED: Medications  acetaminophen  (TYLENOL ) tablet 650 mg (has no administration in time range)    Or  acetaminophen  (TYLENOL ) suppository 650 mg (has no administration in time range)  oxyCODONE  (Oxy IR/ROXICODONE ) immediate release tablet 5 mg (has no administration in time range)  fentaNYL  (SUBLIMAZE ) injection 12.5-50 mcg (has no administration in time range)  polyethylene glycol (MIRALAX  / GLYCOLAX ) packet 17 g (has no administration in time range)  ondansetron  (ZOFRAN ) tablet 4 mg (has no administration in time range)    Or  ondansetron  (ZOFRAN ) injection 4 mg (has no administration in time range)  allopurinol  (ZYLOPRIM ) tablet 100 mg (100 mg Oral Given 04/06/24 1611)  amiodarone  (PACERONE ) tablet 200 mg (200 mg Oral Given 04/06/24 1611)  metoprolol  tartrate (LOPRESSOR ) tablet 12.5 mg (has no administration in time range)  0.9 %  sodium chloride  infusion ( Intravenous New Bag/Given 04/06/24 1607)  sodium chloride   0.9 % bolus 500 mL (0 mLs Intravenous Stopped 04/06/24 1249)     IMPRESSION / MDM / ASSESSMENT AND PLAN / ED COURSE  I reviewed the triage vital signs and the nursing notes.                              Differential diagnosis includes, but is not limited to, possible orthostatic hypotension dehydration, underlying illness infection  etc. cardiac dysrhythmia medication effect from increased Norvasc  dosing etc.  Differential obtained broad he also suffered some elements of blunt trauma  Patient's presentation is most consistent with acute presentation with potential threat to life or bodily function.   The patient is on the cardiac monitor to evaluate for evidence of arrhythmia and/or significant heart rate changes.  Patient presents for multiple syncope, had recent up in blood pressure medicine and I would question if this could be associated but also noted to have mild AKI.  He does report lower extremity edema bilateral new in the last month question if this could be effect of Norvasc .    Clinical Course as of 04/06/24 1632  Thu Apr 06, 2024  1243 Paged Dr. Penne Sharps of neurosurgery to review subdural as well as fact that patient's last dose of anticoagulant was taken this morning.  I am quite suspicious though based on the clinical history that the patient's subdural may have occurred from a fall a few days prior and not from today's of course it is hard to be certain [MQ]  1305 Patient is resting comfortably blood pressures improved.  Currently hydrating.  Patient understanding agreeable plan for admission.  Discussed subdural with patient.  I discussed the case and imaging reviewed with Dr. KATHEE Sharps.  Given the patient's history we will hold further anticoagulation and follow clinically.  No indication for emergent reversal per neurosurgery [MQ]  1306 Mild transaminitis.  Will obtain right upper quadrant ultrasound.  No clear indication of associated pain discomfort or symptoms.  His CT  of the chest is without frank abdominal finding but of course this is limited to CT of the chest.  Will evaluate for hepatobiliary disease.  May be secondary to episodes of hypotension etc. [MQ]  1417 Consult with, accepted to hospitalist service by Dr. Paudel [MQ]    Clinical Course User Index [MQ] Dicky Anes, MD   Patient was admitted with right upper quadrant ultrasound pending, he does not have any associate abdominal pain nausea vomiting or GI symptoms but mild transaminitis.  I did notify Dr. Roann of RUQ final result (patient on hospitalist serices now).    FINAL CLINICAL IMPRESSION(S) / ED DIAGNOSES   Final diagnoses:  Syncope and collapse  SDH (subdural hematoma) (HCC)  Abnormal transaminases  Calculus of gallbladder without cholecystitis without obstruction  AKI (acute kidney injury)     Rx / DC Orders   ED Discharge Orders     None        Note:  This document was prepared using Dragon voice recognition software and may include unintentional dictation errors.   Dicky Anes, MD 04/06/24 [phone]

## 2024-04-06 NOTE — H&P (Addendum)
 History and Physical    Timothy Goodwin FMW:[id number] DOB: Jun 11, 1934 DOA: 04/06/2024  DOS: the patient was seen and examined on 04/06/2024  PCP: Timothy Manna, MD   Patient coming from: ALF [facility]  I have personally briefly reviewed patient's old medical records in [facility]  Chief Complaint: Syncope   HPI: Timothy Goodwin is a pleasant 89 y.o. male with medical history significant for HTN, Aib on Eliquis  and Amiodarone , history of CAD s/p CABG, HLD, gout who came in from [facility] assisted living facility for 2 falls over 4 or 5 days time.  Patient stated that he suddenly felt weak and found him waking up on the floor.  First time he did not seek medical care.  He felt some bruising in his forehead.  Today he was he was trying to get off of the sofa, he stood up and awake on the floor again.  He stated that there was a scratch on tip of his nose and  lips from this morning's fall.  Today his blood pressure was 100/60, normally blood pressure will be elevated higher than that.  He denied any chest pain or palpitations.  He denies any headache, numbness, weakness, neck pain, hip pain.  He denied any nausea, vomiting, diarrhea, abdominal pain, dysuria, hematuria, fever, chills.  He was recently admitted and discharged from the hospital after being treated for A-fib with RVR, hypertension and CKD with myocardial injury in November of this year.  He is on anticoagulation Eliquis  and taking Toprol -XL and amiodarone  for his atrial fibrillation and blood pressure.  ED Course: Upon arrival to the ED, patient is found to be low blood pressure at 96/68, pulse rate 80, EKG showed A-fib with 85 bpm.  CT head showed right subdural acute to subacute 4 mm subdural hematoma.  Left C7 transverse fracture minimally displaced.  CT chest showed minimal pleural effusion on the left side and bilateral pulmonary nodules.  Neurosurgery was consulted and they advised expectant management and also  they advised that he does not need to reverse Eliquis .  His BUN was 55 and creatinine 2.59, his previous creatinine was 1.98 and BUN was 29.  Hospital service was consulted for evaluation for multiple falls and syncope in the setting of atrial fibrillation and hypotension.  Review of Systems:  ROS  All other systems negative except as noted in the HPI.  Past Medical History:  Diagnosis Date   Actinic keratosis    Basal cell carcinoma 11/02/2019   upper back spinal    Carotid artery stenosis    CKD (chronic kidney disease), stage III (HCC)    Coronary artery disease    Gout    Hx of CABG 04/05/2018   2v; LIMA-LAD, SVG-PL   Hyperlipidemia    Hypertension    Left inguinal hernia    NSTEMI (non-ST elevated myocardial infarction) (HCC) 12/25/2013   NSTEMI (non-ST elevated myocardial infarction) (HCC) 03/30/2018   Peripheral vascular disease    Postoperative atrial fibrillation (HCC)    Prostate cancer (HCC) 1995   Right renal artery stenosis     Past Surgical History:  Procedure Laterality Date   CARDIAC CATHETERIZATION N/A 12/27/2013   Location: [facility]; Surgeon: Margie Lovelace, MD   CORONARY ANGIOPLASTY WITH STENT PLACEMENT N/A 2000   OM1 stents (unknown type) placed; Location: [facility]   CORONARY ARTERY BYPASS GRAFT N/A 04/05/2018   2v; LIMA-LAD, SVG-PL; Location: [facility]; Surgeon: Morene Keens, MD   LEFT HEART CATH AND CORONARY ANGIOGRAPHY  N/A 03/30/2018   Procedure: LEFT HEART CATH AND CORONARY ANGIOGRAPHY and possible PCI and stent;  Surgeon: Florencio Cara BIRCH, MD;  Location: [facility] INVASIVE CV LAB;  Service: Cardiovascular;  Laterality: N/A;   PROSTATECTOMY  1995   SMALL BOWEL ENTEROSCOPY  2019   TEE WITH CARDIOVERSION N/A 04/10/2018   Required 3 cardioversions/shocks to briefly restore NSR; A.fib refractory; Location: [facility]   TONSILLECTOMY     XI ROBOTIC ASSISTED INGUINAL HERNIA REPAIR WITH MESH Left 10/30/2020   Procedure: XI ROBOTIC ASSISTED INGUINAL HERNIA REPAIR  WITH MESH;  Surgeon: Lane Shope, MD;  Location: [facility] ORS;  Service: General;  Laterality: Left;     reports that he has never smoked. He has never used smokeless tobacco. He reports that he does not drink alcohol and does not use drugs.  Allergies[1]  Family History  Problem Relation Age of Onset   Prostate cancer Father     Prior to Admission medications  Medication Sig Start Date End Date Taking? Authorizing Provider  allopurinol  (ZYLOPRIM ) 100 MG tablet Take 100 mg by mouth daily. 03/27/16   [provider]  amiodarone  (PACERONE ) 200 MG tablet Take 1 tablet (200 mg total) by mouth 2 (two) times daily for 10 days, THEN 1 tablet (200 mg total) daily for 20 days. 03/09/24 04/08/24  Josette Ade, MD  amLODipine  (NORVASC ) 10 MG tablet Take 1 tablet (10 mg total) by mouth daily. 03/10/24   Josette Ade, MD  apixaban  (ELIQUIS ) 2.5 MG TABS tablet Take 1 tablet (2.5 mg total) by mouth 2 (two) times daily. 11/14/23   Arlon Carliss ORN, DO  Ascorbic Acid (VITAMIN C) 1000 MG tablet Take 1,000 mg by mouth 3 (three) times a week.    [provider]  cloNIDine  (CATAPRES ) 0.1 MG tablet Take 0.1 mg by mouth 2 (two) times daily.  03/16/16   [provider]  Coenzyme Q10 200 MG capsule Take 200 mg by mouth 3 (three) times a week.    [provider]  metoprolol  tartrate (LOPRESSOR ) 25 MG tablet Take 0.5 tablets (12.5 mg total) by mouth 2 (two) times daily. 03/09/24   Josette Ade, MD  Multiple Vitamin (MULTIVITAMIN) capsule Take 1 capsule by mouth daily.    [provider]  Turmeric 450 MG CAPS Take 450 mg by mouth 3 (three) times a week.    [provider]  vitamin E 200 UNIT capsule Take 200 Units by mouth daily.    [provider]    Physical Exam: Vitals:   04/06/24 1033 04/06/24 1217 04/06/24 1220 04/06/24 1230  BP: 96/68 103/67  97/71  Pulse: 80 83  83  Resp: 18 19  (!) 23  Temp: 98 F (36.7 C)  97.6 F (36.4 C)    TempSrc: Oral  Oral   SpO2: 98% 99%  99%  Weight:      Height:        Physical Exam   Constitutional: Alert, awake, calm, comfortable HEENT: Neck supple Respiratory: Clear to auscultation B/L, no wheezing, no rales.  Cardiovascular: Regular rate and rhythm, no murmurs / rubs / gallops. No extremity edema. 2+ pedal pulses. No carotid bruits.  Abdomen: Soft, no tenderness, Bowel sounds positive.  Musculoskeletal: no clubbing / cyanosis. Good ROM, no contractures. Normal muscle tone.  Skin: no rashes, lesions, ulcers. Neurologic: CN 2-12 grossly intact. Sensation intact, No focal deficit identified Psychiatric: Alert and oriented x 3. Normal mood.    Labs on Admission: I have personally reviewed following labs and  imaging studies  CBC: Recent Labs  Lab 04/06/24 1037  WBC 5.8  HGB 10.2*  HCT 30.0*  MCV 91.7  PLT 155   Basic Metabolic Panel: Recent Labs  Lab 04/06/24 1037  NA 138  K 3.8  CL 107  CO2 20*  GLUCOSE 177*  BUN 55*  CREATININE 2.59*  CALCIUM  8.5*   GFR: Estimated Creatinine Clearance: 16.8 mL/min (A) (by C-G formula based on SCr of 2.59 mg/dL (H)). Liver Function Tests: Recent Labs  Lab 04/06/24 1037  AST 117*  ALT 166*  ALKPHOS 171*  BILITOT 0.8  PROT 5.5*  ALBUMIN 3.2*   No results for input(s): LIPASE, AMYLASE in the last 168 hours. No results for input(s): AMMONIA in the last 168 hours. Coagulation Profile: No results for input(s): INR, PROTIME in the last 168 hours. Cardiac Enzymes: No results for input(s): CKTOTAL, CKMB, CKMBINDEX, TROPONINI, TROPONINIHS in the last 168 hours. BNP (last 3 results) No results for input(s): BNP in the last 8760 hours. HbA1C: No results for input(s): HGBA1C in the last 72 hours. CBG: No results for input(s): GLUCAP in the last 168 hours. Lipid Profile: No results for input(s): CHOL, HDL, LDLCALC, TRIG, CHOLHDL, LDLDIRECT in the last 72 hours. Thyroid Function  Tests: No results for input(s): TSH, T4TOTAL, FREET4, T3FREE, THYROIDAB in the last 72 hours. Anemia Panel: No results for input(s): VITAMINB12, FOLATE, FERRITIN, TIBC, IRON, RETICCTPCT in the last 72 hours. Urine analysis:    Component Value Date/Time   COLORURINE YELLOW (A) 04/06/2024 1249   APPEARANCEUR CLOUDY (A) 04/06/2024 1249   APPEARANCEUR Clear 03/19/2021 0836   LABSPEC 1.016 04/06/2024 1249   PHURINE 5.0 04/06/2024 1249   GLUCOSEU NEGATIVE 04/06/2024 1249   HGBUR NEGATIVE 04/06/2024 1249   BILIRUBINUR NEGATIVE 04/06/2024 1249   BILIRUBINUR Negative 03/19/2021 0836   KETONESUR NEGATIVE 04/06/2024 1249   PROTEINUR 30 (A) 04/06/2024 1249   NITRITE NEGATIVE 04/06/2024 1249   LEUKOCYTESUR NEGATIVE 04/06/2024 1249    Radiological Exams on Admission: I have personally reviewed images US  ABDOMEN LIMITED RUQ (LIVER/GB) Result Date: 04/06/2024 CLINICAL DATA:  [id number] Transaminitis [id number] EXAM: ULTRASOUND ABDOMEN LIMITED RIGHT UPPER QUADRANT COMPARISON:  02/23/2024 FINDINGS: Gallbladder: Decompressed containing multiple shadowing gallstones. Circumferential wall thickening with gallbladder wall edema. Small amount of pericholecystic fluid is also present. No sonographic Murphy's sign noted by sonographer. Common bile duct: Diameter: 6 mm Liver: Normal echogenicity. No focal lesion identified. No intrahepatic biliary ductal dilation. Portal vein is patent on color Doppler imaging with normal direction of blood flow towards the liver. Right Kidney: Partially visualized. No mass. No hydronephrosis or nephrolithiasis. Other: None. IMPRESSION: Decompressed gallbladder containing multiple gallstones. Mild gallbladder wall thickening and edema with trace pericholecystic fluid. In the absence of a sonographic Murphy's sign, the study is equivocal for acute cholecystitis. These changes could be due to acute hepatitis, related to intra-abdominal inflammation, hypoproteinemia, or  volume overload from CHF or renal failure. Laboratory correlation recommended. Electronically Signed   By: Rogelia Myers M.D.   On: 04/06/2024 14:10   DG Humerus Left Result Date: 04/06/2024 CLINICAL DATA:  mid shaft pain (mild) after fall EXAM: LEFT HUMERUS - 2+ VIEW COMPARISON:  None Available. FINDINGS: Osteopenia.No acute fracture or dislocation. There is no evidence of arthropathy or other focal bone abnormality. Soft tissues are unremarkable. IMPRESSION: No acute fracture or dislocation. Electronically Signed   By: Rogelia Myers M.D.   On: 04/06/2024 13:34   CT Cervical Spine Wo Contrast Result Date: 04/06/2024 CLINICAL DATA:  Syncopal episode  with fall. EXAM: CT CERVICAL SPINE WITHOUT CONTRAST TECHNIQUE: Multidetector CT imaging of the cervical spine was performed without intravenous contrast. Multiplanar CT image reconstructions were also generated. RADIATION DOSE REDUCTION: This exam was performed according to the departmental dose-optimization program which includes automated exposure control, adjustment of the mA and/or kV according to patient size and/or use of iterative reconstruction technique. COMPARISON:  02/23/2024 FINDINGS: Alignment: No posttraumatic subluxation. Subtle stable degenerative posterior subluxation of C3 on C4 and of C5 and C6. Skull base and vertebrae: Moderate spondylosis of the cervical spine to include uncovertebral joint spurring and facet arthropathy. Vertebral body heights are maintained. Stable minimally displaced left C7 transverse process fracture. No additional fractures visualized. Moderate bilateral neural foraminal narrowing at the C3-4 level, C4-5 and C5-6 levels and to lesser extent at the C6-7 level. Soft tissues and spinal canal: No prevertebral fluid or swelling. No visible canal hematoma. Disc levels: Moderate disc space narrowing from the C3-4 level to the C6-7 level. Upper chest: No acute findings. Other: Old left posterior first and second rib  fractures. IMPRESSION: 1. Stable minimally displaced left C7 transverse process fracture. No additional fractures visualized. 2. Moderate spondylosis of the cervical spine with multilevel disc disease and bilateral neural foraminal narrowing as described. Electronically Signed   By: Toribio Agreste M.D.   On: 04/06/2024 12:15   CT Chest Wo Contrast Result Date: 04/06/2024 CLINICAL DATA:  Left rib pain after multiple falls EXAM: CT CHEST WITHOUT CONTRAST TECHNIQUE: Multidetector CT imaging of the chest was performed following the standard protocol without IV contrast. RADIATION DOSE REDUCTION: This exam was performed according to the departmental dose-optimization program which includes automated exposure control, adjustment of the mA and/or kV according to patient size and/or use of iterative reconstruction technique. COMPARISON:  February 23, 2024 FINDINGS: Cardiovascular: Status post coronary artery bypass graft. Aortic atherosclerosis. No evidence of thoracic aortic aneurysm. Normal cardiac size. No pericardial effusion. Mediastinum/Nodes: No enlarged mediastinal or axillary lymph nodes. Thyroid gland, trachea, and esophagus demonstrate no significant findings. Lungs/Pleura: No pneumothorax is noted. Minimal left pleural effusion is noted with associated mild left posterior basilar atelectasis or infiltrate. 5 mm nodule is noted in right middle lobe best seen on image number 115 of series 3. This is unchanged compared to prior exam. 3 mm nodule is noted in left upper lobe best seen on image number 86 of series 3. This is also unchanged. Upper Abdomen: No acute abnormality. Musculoskeletal: No chest wall mass or suspicious bone lesions identified. IMPRESSION: 1. Minimal left pleural effusion is noted with associated mild left posterior basilar atelectasis or infiltrate. 2. Stable bilateral pulmonary nodules are noted, the largest measuring 5 mm in the right middle lobe. No follow-up needed if patient is low-risk  (and has no known or suspected primary neoplasm). Non-contrast chest CT can be considered in 12 months if patient is high-risk. This recommendation follows the consensus statement: Guidelines for Management of Incidental Pulmonary Nodules Detected on CT Images: From the Fleischner Society 2017; Radiology 2017; 284:228-243. 3. Status post coronary artery bypass graft. 4. Aortic atherosclerosis. Aortic Atherosclerosis (ICD10-I70.0). Electronically Signed   By: Lynwood Landy Raddle M.D.   On: 04/06/2024 12:14   DG Ribs Unilateral W/Chest Left Result Date: 04/06/2024 CLINICAL DATA:  rib injury EXAM: LEFT RIBS AND CHEST - 3+ VIEW COMPARISON:  03/08/2024 FINDINGS: Sternotomy wires and CABG markers. Elevation of the left hemidiaphragm with left basilar atelectasis. Low lung volumes with bronchovascular crowding. No focal airspace consolidation, pleural effusion, or pneumothorax. No  cardiomegaly. Subtle cortical irregularity of the anterior left eighth rib. IMPRESSION: Subtle cortical irregularity of the anterior left eighth rib, worrisome for a nondisplaced fracture. No pneumothorax. Electronically Signed   By: Rogelia Myers M.D.   On: 04/06/2024 11:49   CT HEAD WO CONTRAST Result Date: 04/06/2024 EXAM: CT HEAD WITHOUT CONTRAST 04/06/2024 10:54:37 AM TECHNIQUE: CT of the head was performed without the administration of intravenous contrast. Automated exposure control, iterative reconstruction, and/or weight based adjustment of the mA/kV was utilized to reduce the radiation dose to as low as reasonably achievable. COMPARISON: 02/23/2024 CLINICAL HISTORY: Head trauma, moderate-severe Pt c/o syncopal episode, reports he went to stand up and woke up on the floor. Pt states he has had 2 falls. Pt c/o L rib pain. Pt has an abrasion to the L side of his head, FINDINGS: BRAIN AND VENTRICLES: New 4 mm thick low-density subacute to chronic right subdural hematoma. No acute extraaxial fluid collection. Patchy and confluent areas  of decreased attenuation are noted throughout the deep and periventricular white matter of the cerebral hemispheres bilaterally suggestive of chronic microvascular ischemic changes. Cerebral ventricle sizes are concordant with the degree of cerebral volume loss. Atrophy and chronic small vessel disease changes are also noted. No evidence of acute infarct. No hydrocephalus. No mass effect or midline shift. ORBITS: No acute abnormality. SINUSES: No acute abnormality. SOFT TISSUES AND SKULL: No acute soft tissue abnormality. No skull fracture. IMPRESSION: 1. Subacute to chronic 4 mm right subdural hematoma. No acute component identified. 2. No acute findings. Electronically signed by: Morgane Naveau MD 04/06/2024 11:11 AM EST RP Workstation: [id number]    EKG: My personal interpretation of EKG shows: Atrial fibrillation with controlled response at 87 bpm    Assessment/Plan Principal Problem:   Syncope and collapse Active Problems:   Atrial flutter with rapid ventricular response (HCC)   Cancer of prostate (HCC)   Hyperlipidemia   S/P CABG x 2   Atrial fibrillation, rapid (HCC)   AKI (acute kidney injury)   Hypotension    Assessment and Plan: 89 year old male with history of atrial fibrillation, HTN, CKD stage III, HLD, prostate cancer who was brought in from [facility] assisted living facility for couple episodes of fall within 5 days.  1.  Frequent fall/syncope/hypotension - Patient was recently here at [facility] for A-fib with RVR. - He was placed on multiple antihypertensive medications including metoprolol , amlodipine , hydralazine .  Patient blood pressure was in low 90s.  This could be the reason for him to have frequent fall as patient felt dizzy and fell both times. - Will hold off his hydralazine  and amlodipine  at this point both of them cause orthostatic hypotension. - Continue metoprolol . - He will be monitored in telemetry - PT/OT - Orthostatic vital signs  2.  Right subdural  hematoma - It appears to look subacute to chronic - Neurosurgeon has been consulted and they advised for medical management - Will hold off Eliquis  and continue to monitor his mentation. - He is currently alert awake and oriented x 4 with no neurological deficits  3.  Atrial fibrillation with controlled ventricular response - He will be continued on amiodarone  and metoprolol  - He will be monitored in telemetry - He is Eliquis  will be discontinued due to subdural hematoma for now  4.  History of hypertension now hypotensive - It appears that he has orthostatic hypotension and he was falling when tried to get up - Will hold those medications they have been known to have orthostatic hypotension  amlodipine  and hydralazine . - May be he  should be permanently off of those medications - Will check orthostatic vitals as mentioned above - He will also get some IV fluid hydration  5.  AKI on CKD 3-4 - His creatinine normally runs around 2 - Today creatinine is high at 2.59 - Will continue to monitor  6.  Hyperglycemia - I do not see him taking any medications for diabetes - Will check A1c - I will place him on insulin sliding scale  6.  Slightly elevated troponin - It appears that he has chronically high troponin - He had 199 last time and this is 68 - Will continue to monitor for symptoms of chest pain  7.  Mild transaminitis with normal bilirubin/cholelithiasis on ultrasound - Will get hepatitis panel - Monitor liver function - He is not asymptomatic and there is no leukocytosis at this point. - He may need to have outpatient evaluation if he is symptomatic - There may be a concern for acute cholecystitis. - General Surgery consult will be requested.  8.  C7 spine fracture - There is no neurological deficit at this point - Pain management - Neurosurgery has been consulted - Will follow the recommendations     DVT prophylaxis: SCDs Code Status: Full Code Family  Communication: None, friend was available at bedside  Disposition Plan: Back to assisted living facility Consults called: Neurosurgery Admission status: Observation, progressive   Nena Rebel, MD [facility] 04/06/2024, 3:29 PM        [1]  Allergies Allergen Reactions   Aspirin  Shortness Of Breath and Swelling   Lisinopril Shortness Of Breath   Ace Inhibitors Other (See Comments)    Unknown reaction   Nsaids Swelling   Sulfa Antibiotics Swelling and Rash

## 2024-04-07 ENCOUNTER — Observation Stay

## 2024-04-07 DIAGNOSIS — R748 Abnormal levels of other serum enzymes: Secondary | ICD-10-CM | POA: Diagnosis not present

## 2024-04-07 DIAGNOSIS — K8020 Calculus of gallbladder without cholecystitis without obstruction: Secondary | ICD-10-CM

## 2024-04-07 DIAGNOSIS — R55 Syncope and collapse: Secondary | ICD-10-CM | POA: Diagnosis not present

## 2024-04-07 LAB — COMPREHENSIVE METABOLIC PANEL WITH GFR
ALT: 155 U/L — ABNORMAL HIGH (ref 0–44)
AST: 87 U/L — ABNORMAL HIGH (ref 15–41)
Albumin: 2.9 g/dL — ABNORMAL LOW (ref 3.5–5.0)
Alkaline Phosphatase: 192 U/L — ABNORMAL HIGH (ref 38–126)
Anion gap: 15 (ref 5–15)
BUN: 52 mg/dL — ABNORMAL HIGH (ref 8–23)
CO2: 16 mmol/L — ABNORMAL LOW (ref 22–32)
Calcium: 8 mg/dL — ABNORMAL LOW (ref 8.9–10.3)
Chloride: 109 mmol/L (ref 98–111)
Creatinine, Ser: 2.26 mg/dL — ABNORMAL HIGH (ref 0.61–1.24)
GFR, Estimated: 27 mL/min — ABNORMAL LOW
Glucose, Bld: 139 mg/dL — ABNORMAL HIGH (ref 70–99)
Potassium: 3.8 mmol/L (ref 3.5–5.1)
Sodium: 139 mmol/L (ref 135–145)
Total Bilirubin: 0.6 mg/dL (ref 0.0–1.2)
Total Protein: 5.2 g/dL — ABNORMAL LOW (ref 6.5–8.1)

## 2024-04-07 LAB — CBC
HCT: 34.4 % — ABNORMAL LOW (ref 39.0–52.0)
Hemoglobin: 11.1 g/dL — ABNORMAL LOW (ref 13.0–17.0)
MCH: 30.7 pg (ref 26.0–34.0)
MCHC: 32.3 g/dL (ref 30.0–36.0)
MCV: 95.3 fL (ref 80.0–100.0)
Platelets: 196 K/uL (ref 150–400)
RBC: 3.61 MIL/uL — ABNORMAL LOW (ref 4.22–5.81)
RDW: 14.4 % (ref 11.5–15.5)
WBC: 7.5 K/uL (ref 4.0–10.5)
nRBC: 0 % (ref 0.0–0.2)

## 2024-04-07 LAB — HEPATITIS PANEL, ACUTE
HCV Ab: NONREACTIVE
Hep A IgM: NONREACTIVE
Hep B C IgM: NONREACTIVE
Hepatitis B Surface Ag: NONREACTIVE

## 2024-04-07 LAB — PROTIME-INR
INR: 1.2 (ref 0.8–1.2)
Prothrombin Time: 16.4 s — ABNORMAL HIGH (ref 11.4–15.2)

## 2024-04-07 MED ORDER — TECHNETIUM TC 99M MEBROFENIN IV KIT
5.4800 | PACK | Freq: Once | INTRAVENOUS | Status: AC | PRN
  Administered 2024-04-07: 5.48 via INTRAVENOUS

## 2024-04-07 MED ORDER — SODIUM CHLORIDE 0.9 % IV SOLN: INTRAVENOUS | Status: DC

## 2024-04-07 NOTE — Consult Note (Signed)
 [facility] SURGICAL CONSULTATION NOTE (initial) - cpt: 00746   HISTORY OF PRESENT ILLNESS (HPI):  89 y.o. male presented to [facility] ED on 12/18 secondary to LOC. Patient reported in the last few days at home he noticed two episodes where he lost consciousness. He initially did not seek care for this. It sound like, prior to arrival, he was trying to get up from the coach and awoke on the floor. This morning, he denied any pain. No fever, chills, nausea, emesis, abdominal pain, urinary changes, or bowel changes. He denied any previous RUQ pains or post-prandial pain. Previous abdominal surgeries positive for inguinal hernia. Work up in the ED revealed elevation in his ALT, AST, and Alk Phos but bilirubin level was normal at 0.8. His WBC was normal at 5.8K and Hgb 10.2. He was also found to have a small subdural hematoma. He has been seen by neurosurgery. He was admitted to the medicine service. He did have RUQ US  given transaminitis and this showed contracted gallbladder with cholelithiasis.   Surgery is consulted by hospitalist physician Dr. Nena Rebel, MD in this context for evaluation and management of possible cholecystitis.  PAST MEDICAL HISTORY (PMH):  Past Medical History:  Diagnosis Date   Actinic keratosis    Basal cell carcinoma 11/02/2019   upper back spinal    Carotid artery stenosis    CKD (chronic kidney disease), stage III (HCC)    Coronary artery disease    Gout    Hx of CABG 04/05/2018   2v; LIMA-LAD, SVG-PL   Hyperlipidemia    Hypertension    Left inguinal hernia    NSTEMI (non-ST elevated myocardial infarction) (HCC) 12/25/2013   NSTEMI (non-ST elevated myocardial infarction) (HCC) 03/30/2018   Peripheral vascular disease    Postoperative atrial fibrillation (HCC)    Prostate cancer (HCC) 1995   Right renal artery stenosis      PAST SURGICAL HISTORY (PSH):  Past Surgical History:  Procedure Laterality Date   CARDIAC CATHETERIZATION N/A 12/27/2013    Location: [facility]; Surgeon: Margie Lovelace, MD   CORONARY ANGIOPLASTY WITH STENT PLACEMENT N/A 2000   OM1 stents (unknown type) placed; Location: [facility]   CORONARY ARTERY BYPASS GRAFT N/A 04/05/2018   2v; LIMA-LAD, SVG-PL; Location: [facility]; Surgeon: Morene Keens, MD   LEFT HEART CATH AND CORONARY ANGIOGRAPHY N/A 03/30/2018   Procedure: LEFT HEART CATH AND CORONARY ANGIOGRAPHY and possible PCI and stent;  Surgeon: Lovelace Cara BIRCH, MD;  Location: [facility] INVASIVE CV LAB;  Service: Cardiovascular;  Laterality: N/A;   PROSTATECTOMY  1995   SMALL BOWEL ENTEROSCOPY  2019   TEE WITH CARDIOVERSION N/A 04/10/2018   Required 3 cardioversions/shocks to briefly restore NSR; A.fib refractory; Location: [facility]   TONSILLECTOMY     XI ROBOTIC ASSISTED INGUINAL HERNIA REPAIR WITH MESH Left 10/30/2020   Procedure: XI ROBOTIC ASSISTED INGUINAL HERNIA REPAIR WITH MESH;  Surgeon: Lane Shope, MD;  Location: [facility] ORS;  Service: General;  Laterality: Left;     MEDICATIONS:  Prior to Admission medications  Medication Sig Start Date End Date Taking? Authorizing Provider  allopurinol  (ZYLOPRIM ) 100 MG tablet Take 100 mg by mouth daily. 03/27/16  Yes [provider]  amiodarone  (PACERONE ) 200 MG tablet Take 1 tablet (200 mg total) by mouth 2 (two) times daily for 10 days, THEN 1 tablet (200 mg total) daily for 20 days. 03/09/24 04/08/24 Yes Wieting, Richard, MD  amLODipine  (NORVASC ) 5 MG tablet Take 5 mg by mouth 2 (two) times daily.  Yes [provider]  apixaban  (ELIQUIS ) 2.5 MG TABS tablet Take 1 tablet (2.5 mg total) by mouth 2 (two) times daily. 11/14/23  Yes Arlon Carliss ORN, DO  Ascorbic Acid (VITAMIN C) 1000 MG tablet Take 1,000 mg by mouth 3 (three) times a week.   Yes [provider]  atorvastatin  (LIPITOR) 10 MG tablet Take 10 mg by mouth daily. 03/19/24  Yes [provider]  cloNIDine  (CATAPRES ) 0.1 MG tablet Take 0.1 mg by mouth 2 (two) times daily.   03/16/16  Yes [provider]  hydrALAZINE  (APRESOLINE ) 50 MG tablet Take 50 mg by mouth 3 (three) times daily.   Yes [provider]  metoprolol  tartrate (LOPRESSOR ) 25 MG tablet Take 0.5 tablets (12.5 mg total) by mouth 2 (two) times daily. Patient taking differently: Take 25 mg by mouth 2 (two) times daily. 03/09/24  Yes Wieting, Richard, MD  Multiple Vitamin (MULTIVITAMIN) capsule Take 1 capsule by mouth daily.   Yes [provider]  Turmeric 450 MG CAPS Take 450 mg by mouth 3 (three) times a week.   Yes [provider]  vitamin E 200 UNIT capsule Take 200 Units by mouth daily.   Yes [provider]  amLODipine  (NORVASC ) 10 MG tablet Take 1 tablet (10 mg total) by mouth daily. Patient not taking: Reported on 04/06/2024 03/10/24   Josette Ade, MD  Coenzyme Q10 200 MG capsule Take 200 mg by mouth 3 (three) times a week. Patient not taking: Reported on 04/06/2024    [provider]     ALLERGIES:  Allergies[1]   SOCIAL HISTORY:  Social History   Socioeconomic History   Marital status: Married    Spouse name: Not on file   Number of children: Not on file   Years of education: Not on file   Highest education level: Not on file  Occupational History   Not on file  Tobacco Use   Smoking status: Never   Smokeless tobacco: Never  Vaping Use   Vaping status: Never Used  Substance and Sexual Activity   Alcohol use: Never   Drug use: No   Sexual activity: Not on file  Other Topics Concern   Not on file  Social History Narrative   Not on file   Social Drivers of Health   Tobacco Use: Low Risk (04/06/2024)   Patient History    Smoking Tobacco Use: Never    Smokeless Tobacco Use: Never    Passive Exposure: Not on file  Financial Resource Strain: Low Risk  (02/28/2024)   Received from [facility]   Overall Financial Resource Strain (CARDIA)    Difficulty of Paying Living Expenses: Not hard at all   Food Insecurity: No Food Insecurity (03/08/2024)   Epic    Worried About Radiation Protection Practitioner of Food in the Last Year: Never true    Ran Out of Food in the Last Year: Never true  Transportation Needs: No Transportation Needs (03/08/2024)   Epic    Lack of Transportation (Medical): No    Lack of Transportation (Non-Medical): No  Physical Activity: Not on file  Stress: Not on file  Social Connections: Moderately Integrated (03/08/2024)   Social Connection and Isolation Panel    Frequency of Communication with Friends and Family: Twice a week    Frequency of Social Gatherings with Friends and Family: Once a week    Attends Religious Services: More than 4 times per year    Active Member of [employer] or Organizations:  No    Attends Club or Organization Meetings: Never    Marital Status: Married  Catering Manager Violence: Not At Risk (03/08/2024)   Epic    Fear of Current or Ex-Partner: No    Emotionally Abused: No    Physically Abused: No    Sexually Abused: No  Depression (PHQ2-9): Not on file  Alcohol Screen: Not on file  Housing: Low Risk (03/08/2024)   Epic    Unable to Pay for Housing in the Last Year: No    Number of Times Moved in the Last Year: 0    Homeless in the Last Year: No  Utilities: Not At Risk (03/08/2024)   Epic    Threatened with loss of utilities: No  Health Literacy: Not on file     FAMILY HISTORY:  Family History  Problem Relation Age of Onset   Prostate cancer Father       REVIEW OF SYSTEMS:  Review of Systems  Constitutional:  Negative for chills and fever.  Respiratory:  Negative for cough and shortness of breath.   Cardiovascular:  Negative for chest pain and palpitations.  Gastrointestinal:  Negative for abdominal pain, nausea and vomiting.  Genitourinary:  Negative for dysuria and urgency.  Neurological:  Positive for loss of consciousness. Negative for dizziness and weakness.  All other systems reviewed and are negative.   VITAL SIGNS:  Temp:   [97.4 F (36.3 C)-98.1 F (36.7 C)] 97.4 F (36.3 C) (12/19 0400) Pulse Rate:  [80-112] 112 (12/18 1800) Resp:  [18-28] 28 (12/18 1800) BP: (96-119)/(67-71) 119/67 (12/18 1800) SpO2:  [96 %-99 %] 96 % (12/18 1800) Weight:  [70.7 kg] 70.7 kg (12/18 1031)     Height: 5' 4 (162.6 cm) Weight: 70.7 kg BMI (Calculated): 26.75   INTAKE/OUTPUT:  12/18 0701 - 12/19 0700 In: 1263.2 [I.V.:1263.2] Out: -   PHYSICAL EXAM:  Physical Exam Vitals and nursing note reviewed. Exam conducted with a chaperone present.  Constitutional:      General: He is not in acute distress.    Appearance: Normal appearance. He is normal weight. He is not ill-appearing.     Comments: Resting in bed; NAD  HENT:     Head: Normocephalic and atraumatic.  Eyes:     General: No scleral icterus.    Conjunctiva/sclera: Conjunctivae normal.  Cardiovascular:     Rate and Rhythm: Tachycardia present.     Heart sounds: No murmur heard. Pulmonary:     Effort: Pulmonary effort is normal. No respiratory distress.  Abdominal:     General: Abdomen is flat. There is no distension.     Palpations: Abdomen is soft.     Tenderness: There is no abdominal tenderness. There is no guarding or rebound. Negative signs include Murphy's sign.  Genitourinary:    Comments: Deferred Musculoskeletal:     Right lower leg: No edema.     Left lower leg: No edema.  Skin:    General: Skin is warm and dry.     Coloration: Skin is not jaundiced.  Neurological:     General: No focal deficit present.     Mental Status: He is alert and oriented to person, place, and time.  Psychiatric:        Mood and Affect: Mood normal.        Behavior: Behavior normal.      Labs:     Latest Ref Rng & Units 04/06/2024   10:37 AM 03/09/2024    3:15 AM 03/08/2024  7:25 AM  CBC  WBC 4.0 - 10.5 K/uL 5.8  6.5  6.1   Hemoglobin 13.0 - 17.0 g/dL 89.7  88.0  87.9   Hematocrit 39.0 - 52.0 % 30.0  35.5  36.7   Platelets 150 - 400 K/uL 155  199  188        Latest Ref Rng & Units 04/06/2024   10:37 AM 03/09/2024    3:15 AM 03/08/2024    8:10 AM  CMP  Glucose 70 - 99 mg/dL 822  887  99   BUN 8 - 23 mg/dL 55  29  32   Creatinine 0.61 - 1.24 mg/dL 7.40  8.01  8.10   Sodium 135 - 145 mmol/L 138  142  139   Potassium 3.5 - 5.1 mmol/L 3.8  4.4  4.6   Chloride 98 - 111 mmol/L 107  108  111   CO2 22 - 32 mmol/L 20  23  19    Calcium  8.9 - 10.3 mg/dL 8.5  8.3  7.6   Total Protein 6.5 - 8.1 g/dL 5.5     Total Bilirubin 0.0 - 1.2 mg/dL 0.8     Alkaline Phos 38 - 126 U/L 171     AST 15 - 41 U/L 117     ALT 0 - 44 U/L 166        Imaging studies:   RUQ US  (04/06/2024) personally reviewed with contraction of the gallbladder, noted stones, and radiologist report reviewed below:  IMPRESSION: Decompressed gallbladder containing multiple gallstones. Mild gallbladder wall thickening and edema with trace pericholecystic fluid. In the absence of a sonographic Murphy's sign, the study is equivocal for acute cholecystitis. These changes could be due to acute hepatitis, related to intra-abdominal inflammation, hypoproteinemia, or volume overload from CHF or renal failure. Laboratory correlation recommended.   Assessment/Plan:  89 y.o. male with reported LOC at home, found to have small subdural hematoma, also incidentally found to he have cholelithiasis in setting of transaminitis.    - Patient has been without any abdominal pain nor post-prandial symptoms. Although he did have transaminitis on admission, I have a very low clinical suspicion for acute cholecystitis at this time. However, we will complete work up for this with HIDA.   - If HIDA is negative, nothing further from surgical standpoint   - IF HIDA is positive, would recommend conservative management first with Abx. Given his comorbidities, he is a sub-optimal candidate for surgery in this setting. I also think given his gallbladder contraction on initial US  would not allow for  percutaneous cholecystostomy tube.   - NPO for HIDA; Can resume diet pending findings  - Monitor abdominal examination   - Monitor transaminitis; follow up labs pending   - Further management per primary service   All of the above findings and recommendations were discussed with the patient, and all of patient's questions were answered to his expressed satisfaction.  Thank you for the opportunity to participate in this patient's care.   -- Arthea Platt, PA-C [facility] 04/07/2024, 8:05 AM M-F: 7am - 4pm     [1]  Allergies Allergen Reactions   Aspirin  Shortness Of Breath and Swelling   Lisinopril Shortness Of Breath   Ace Inhibitors Other (See Comments)    Unknown reaction   Nsaids Swelling   Sulfa Antibiotics Swelling and Rash
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
## 2024-04-07 NOTE — Care Management Obs Status (Signed)
 MEDICARE OBSERVATION STATUS NOTIFICATION   Patient Details  Name: Timothy Goodwin MRN: [id number] Date of Birth: 03/19/1935   Medicare Observation Status Notification Given:  Yes    Richardine Peppers W, CMA 04/07/2024, 3:32 PM
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
## 2024-04-07 NOTE — ED Notes (Signed)
Pt taken for HIDA scan. 

## 2024-04-08 DIAGNOSIS — R55 Syncope and collapse: Secondary | ICD-10-CM | POA: Diagnosis not present

## 2024-04-08 LAB — BASIC METABOLIC PANEL WITH GFR
Anion gap: 11 (ref 5–15)
BUN: 54 mg/dL — ABNORMAL HIGH (ref 8–23)
CO2: 19 mmol/L — ABNORMAL LOW (ref 22–32)
Calcium: 8.2 mg/dL — ABNORMAL LOW (ref 8.9–10.3)
Chloride: 110 mmol/L (ref 98–111)
Creatinine, Ser: 2.16 mg/dL — ABNORMAL HIGH (ref 0.61–1.24)
GFR, Estimated: 29 mL/min — ABNORMAL LOW
Glucose, Bld: 126 mg/dL — ABNORMAL HIGH (ref 70–99)
Potassium: 3.6 mmol/L (ref 3.5–5.1)
Sodium: 140 mmol/L (ref 135–145)

## 2024-04-08 LAB — MAGNESIUM: Magnesium: 2.3 mg/dL (ref 1.7–2.4)

## 2024-04-08 MED ORDER — AMIODARONE HCL 200 MG PO TABS: 200.0000 mg | ORAL_TABLET | Freq: Every day | ORAL | 2 refills | Status: DC

## 2024-04-08 MED ORDER — METOPROLOL TARTRATE 25 MG PO TABS: 25.0000 mg | ORAL_TABLET | Freq: Two times a day (BID) | ORAL | 2 refills | Status: DC
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
## 2024-04-08 NOTE — TOC CM/SW Note (Signed)
" ° ° ° °  [facility] REFERRAL        Occupational Therapy * Physical Therapy * Speech Therapy                           DATE: 04/08/2024  PATIENT NAME: Timothy Goodwin  PATIENT MR: [id number]       DIAGNOSIS: Syncope and collapse  DATE OF DISCHARGE: 04/08/2024       PRIMARY CARE PHYSICIAN: [name], MD   PCP PHONE: [phone]     Dear Provider: Lavella Eric   Fax: [phone]   I certify that I have examined this patient and that occupational/physical/speech therapy is necessary on an outpatient basis.    The patient has expressed interest in completing their recommended course of therapy at your  location.  Once a formal order from the patient's primary care physician has been obtained, please  contact him/her to schedule an appointment for evaluation at your earliest convenience.   [X]   Physical Therapy Evaluate and Treat  [ X]  Occupational Therapy Evaluate and Treat  [  ]  Speech Therapy Evaluate and Treat         The patient's primary care physician (listed above) must furnish and be responsible for a formal order such that the recommended services may be furnished while under the primary physician's care, and that the plan of care will be established and reviewed every 30 days (or more often if condition necessitates).   "

## 2024-04-08 NOTE — TOC Progression Note (Signed)
 Transition of Care [facility]) - Progression Note    Patient Details  Name: Timothy Goodwin MRN: [id number] Date of Birth: 1935-01-16  Transition of [facility] CM/SW Contact  Victory Jackquline RAMAN, RN Phone Number: 04/08/2024, 3:22 PM  Clinical Narrative:  RNCM received a message via secure chat from MD informing me that patient is medically stable for discharge and orders are in for HH/PT/OT. Patient returning to [facility]. Daughter Vina and Gilmer Heck notified. Son Any picking patient up. Spoke to [place] with [facility] and said that they can provide HH/PT/OT for the patient their at [facility].No further concerns. RNCM signing off.  4:21 pm: RNCM spoke to patient's son about the HH/PT/OT and he and his father would like to have [facility] provide PT/OT. MD made aware. Referral for Outpatient PT/OT signed by MD and faxed to [facility] @ [phone].                      Expected Discharge Plan and Services         Expected Discharge Date: 04/08/24                                     Social Drivers of Health (SDOH) Interventions SDOH Screenings   Food Insecurity: No Food Insecurity (04/07/2024)  Housing: Unknown (04/07/2024)  Transportation Needs: No Transportation Needs (04/07/2024)  Utilities: Not At Risk (04/07/2024)  Financial Resource Strain: Low Risk  (02/28/2024)   Received from [facility]  Social Connections: Socially Integrated (04/07/2024)  Tobacco Use: Low Risk (04/06/2024)    Readmission Risk Interventions     No data to display

## 2024-04-08 NOTE — Plan of Care (Signed)

## 2024-04-10 ENCOUNTER — Other Ambulatory Visit: Payer: Self-pay | Admitting: Neurosurgery

## 2024-04-10 ENCOUNTER — Telehealth: Payer: Self-pay

## 2024-04-10 DIAGNOSIS — S065XAA Traumatic subdural hemorrhage with loss of consciousness status unknown, initial encounter: Secondary | ICD-10-CM

## 2024-04-10 NOTE — Telephone Encounter (Signed)
 Spoke to patient in regards to scheduling his CT scan. I advised him to have it done this week, since his f/u with us  is next Monday. Verbalized understanding. I transferred him to [facility] to make that appointment.

## 2024-04-14 ENCOUNTER — Ambulatory Visit
Admission: RE | Admit: 2024-04-14 | Discharge: 2024-04-14 | Disposition: A | Discharge status: Home / Self Care | Source: Ambulatory Visit | Attending: Neurosurgery | Admitting: Neurosurgery

## 2024-04-14 DIAGNOSIS — S065XAA Traumatic subdural hemorrhage with loss of consciousness status unknown, initial encounter: Secondary | ICD-10-CM | POA: Diagnosis present

## 2024-04-17 ENCOUNTER — Ambulatory Visit: Admitting: Neurosurgery

## 2024-04-17 ENCOUNTER — Encounter: Payer: Self-pay | Admitting: Neurosurgery

## 2024-04-17 VITALS — BP 122/80 | Wt 141.8 lb

## 2024-04-17 DIAGNOSIS — S065XAA Traumatic subdural hemorrhage with loss of consciousness status unknown, initial encounter: Secondary | ICD-10-CM

## 2024-04-17 DIAGNOSIS — S065XAD Traumatic subdural hemorrhage with loss of consciousness status unknown, subsequent encounter: Secondary | ICD-10-CM | POA: Diagnosis not present

## 2024-04-17 DIAGNOSIS — W19XXXD Unspecified fall, subsequent encounter: Secondary | ICD-10-CM

## 2024-04-17 NOTE — Patient Instructions (Addendum)
 Timothy Goodwin has ordered another Head CT to be done in 1 month (around 05/19/23). You can call the scheduling department at [phone] to get this scheduled for a day/time that works best for you.  Please let us  know if you have any trouble.

## 2024-04-17 NOTE — Progress Notes (Signed)
 "  Referring Physician:  [name], MD [address] [facility] [place],  [place] [address]  Primary Physician:  [name], MD  History of Present Illness: 04/17/2024 [name] is here today for follow up of SDH. He was seen in the hospital on 04/06/24 by Dr. Claudene for evaluation after a fall due to a syncopal event. He was on Eliquis  at the time. He was neurologically asymptomatic.  Today he is working with cardiology to figure out the origin of his syncope.  He reports he is without headaches.  Review of Systems:  A 10 point review of systems is negative, except for the pertinent positives and negatives detailed in the HPI.  Past Medical History: Past Medical History:  Diagnosis Date   Actinic keratosis    Basal cell carcinoma 11/02/2019   upper back spinal    Carotid artery stenosis    CKD (chronic kidney disease), stage III (HCC)    Coronary artery disease    Gout    Hx of CABG 04/05/2018   2v; LIMA-LAD, SVG-PL   Hyperlipidemia    Hypertension    Left inguinal hernia    NSTEMI (non-ST elevated myocardial infarction) (HCC) 12/25/2013   NSTEMI (non-ST elevated myocardial infarction) (HCC) 03/30/2018   Peripheral vascular disease    Postoperative atrial fibrillation (HCC)    Prostate cancer (HCC) 1995   Right renal artery stenosis     Past Surgical History: Past Surgical History:  Procedure Laterality Date   CARDIAC CATHETERIZATION N/A 12/27/2013   Location: [facility]; Surgeon: Margie Lovelace, MD   CORONARY ANGIOPLASTY WITH STENT PLACEMENT N/A 2000   OM1 stents (unknown type) placed; Location: [facility]   CORONARY ARTERY BYPASS GRAFT N/A 04/05/2018   2v; LIMA-LAD, SVG-PL; Location: [facility]; Surgeon: Morene Keens, MD   LEFT HEART CATH AND CORONARY ANGIOGRAPHY N/A 03/30/2018   Procedure: LEFT HEART CATH AND CORONARY ANGIOGRAPHY and possible PCI and stent;  Surgeon: Lovelace Cara BIRCH, MD;  Location: [facility] INVASIVE CV LAB;   Service: Cardiovascular;  Laterality: N/A;   PROSTATECTOMY  1995   SMALL BOWEL ENTEROSCOPY  2019   TEE WITH CARDIOVERSION N/A 04/10/2018   Required 3 cardioversions/shocks to briefly restore NSR; A.fib refractory; Location: [facility]   TONSILLECTOMY     XI ROBOTIC ASSISTED INGUINAL HERNIA REPAIR WITH MESH Left 10/30/2020   Procedure: XI ROBOTIC ASSISTED INGUINAL HERNIA REPAIR WITH MESH;  Surgeon: Lane Shope, MD;  Location: [facility] ORS;  Service: General;  Laterality: Left;    Allergies: Allergies as of 04/17/2024 - Review Complete 04/17/2024  Allergen Reaction Noted   Aspirin  Shortness Of Breath and Swelling    Lisinopril Shortness Of Breath    Ace inhibitors Other (See Comments) 05/17/2019   Nsaids Swelling 03/21/2012   Sulfa antibiotics Swelling and Rash 03/21/2012    Medications: Outpatient Encounter Medications as of 04/17/2024  Medication Sig   allopurinol  (ZYLOPRIM ) 100 MG tablet Take 100 mg by mouth daily.   amiodarone  (PACERONE ) 200 MG tablet Take 1 tablet (200 mg total) by mouth daily. Home med.   atorvastatin  (LIPITOR) 10 MG tablet Take 10 mg by mouth daily.   metoprolol  tartrate (LOPRESSOR ) 25 MG tablet Take 1 tablet (25 mg total) by mouth 2 (two) times daily. Home med.   [Paused] apixaban  (ELIQUIS ) 2.5 MG TABS tablet Take 1 tablet (2.5 mg total) by mouth 2 (two) times daily. (Patient not taking: Reported on 04/17/2024)   [DISCONTINUED] Ascorbic Acid (VITAMIN C) 1000 MG tablet Take 1,000 mg by  mouth 3 (three) times a week.   [DISCONTINUED] Multiple Vitamin (MULTIVITAMIN) capsule Take 1 capsule by mouth daily.   [DISCONTINUED] Turmeric 450 MG CAPS Take 450 mg by mouth 3 (three) times a week.   [DISCONTINUED] vitamin E 200 UNIT capsule Take 200 Units by mouth daily.   No facility-administered encounter medications on file as of 04/17/2024.    Social History: Social History[1]  Family Medical History: Family History  Problem Relation Age of Onset   Prostate cancer  Father     Physical Examination: Today's Vitals   04/17/24 0916  BP: 122/80  Weight: 141 lb 12.8 oz (64.3 kg)  PainSc: 0-No pain   Body mass index is 24.34 kg/m.   General: Patient is well developed, well nourished, calm, collected, and in no apparent distress. Attention to examination is appropriate.  Psychiatric: Patient is non-anxious.  Head:  Pupils equal, round, and reactive to light.  ENT:  Oral mucosa appears well hydrated.  Neck:   Supple.   Respiratory: Patient is breathing without any difficulty.  Extremities: No edema.  Vascular: Palpable dorsal pedal pulses.  Skin:   On exposed skin, there are no abnormal skin lesions.  NEUROLOGICAL:     Awake, alert, oriented to person, place, and time.  Speech is clear and fluent. Fund of knowledge is appropriate.   Cranial Nerves: Pupils equal round and reactive to light.  Facial tone is symmetric.  Facial sensation is symmetric. No pronator drift  Strength: 5/5 throughout upper and lower extremities.   Bilateral upper and lower extremity sensation is intact to light touch.    Gait is normal.     Medical Decision Making  Imaging: CT head 04/14/24 IMPRESSION: 1. 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 No evidence of new or increasing intracranial hemorrhage.   Electronically signed by: Prentice Spade MD 04/17/2024 08:46 AM EST RP Workstation: [id number]  I have personally reviewed the images and agree with the above interpretation.  Assessment and Plan: [name] is a pleasant 89 y.o. male presenting today for hospital f/u of SDH. Unfortunately he continues to have a 0.5cm chronic appearing SDH. It does not have appeared to change. We discussed red flag signs and symptoms. He will follow up with a repeat CT head in 4 weeks or sooner should there be any concerns. He was encouraged to call  the office in the interim with any questions or concerns.   Thank you for involving me in the care of this patient.   I spent a total of 30 minutes in both face-to-face and non-face-to-face activities for this visit on the date of this encounter.   [employer] [facility]      [1]  Social History Tobacco Use   Smoking status: Never   Smokeless tobacco: Never  Vaping Use   Vaping status: Never Used  Substance Use Topics   Alcohol use: Never   Drug use: No   "

## 2024-05-04 ENCOUNTER — Emergency Department

## 2024-05-04 ENCOUNTER — Observation Stay
Admission: EM | Admit: 2024-05-04 | Discharge: 2024-05-04 | Disposition: A | Discharge status: Home / Self Care | Attending: Hospitalist | Admitting: Hospitalist

## 2024-05-04 ENCOUNTER — Other Ambulatory Visit: Payer: Self-pay

## 2024-05-04 DIAGNOSIS — I4891 Unspecified atrial fibrillation: Secondary | ICD-10-CM | POA: Diagnosis not present

## 2024-05-04 DIAGNOSIS — R7989 Other specified abnormal findings of blood chemistry: Secondary | ICD-10-CM | POA: Diagnosis not present

## 2024-05-04 DIAGNOSIS — I2510 Atherosclerotic heart disease of native coronary artery without angina pectoris: Secondary | ICD-10-CM | POA: Insufficient documentation

## 2024-05-04 DIAGNOSIS — Z951 Presence of aortocoronary bypass graft: Secondary | ICD-10-CM | POA: Insufficient documentation

## 2024-05-04 DIAGNOSIS — E785 Hyperlipidemia, unspecified: Secondary | ICD-10-CM | POA: Insufficient documentation

## 2024-05-04 DIAGNOSIS — I130 Hypertensive heart and chronic kidney disease with heart failure and stage 1 through stage 4 chronic kidney disease, or unspecified chronic kidney disease: Secondary | ICD-10-CM | POA: Insufficient documentation

## 2024-05-04 DIAGNOSIS — Z85828 Personal history of other malignant neoplasm of skin: Secondary | ICD-10-CM | POA: Insufficient documentation

## 2024-05-04 DIAGNOSIS — N1832 Chronic kidney disease, stage 3b: Secondary | ICD-10-CM | POA: Insufficient documentation

## 2024-05-04 DIAGNOSIS — I5030 Unspecified diastolic (congestive) heart failure: Secondary | ICD-10-CM | POA: Diagnosis not present

## 2024-05-04 DIAGNOSIS — Z7901 Long term (current) use of anticoagulants: Secondary | ICD-10-CM | POA: Insufficient documentation

## 2024-05-04 DIAGNOSIS — Z955 Presence of coronary angioplasty implant and graft: Secondary | ICD-10-CM | POA: Insufficient documentation

## 2024-05-04 DIAGNOSIS — R002 Palpitations: Secondary | ICD-10-CM | POA: Diagnosis present

## 2024-05-04 DIAGNOSIS — Z8546 Personal history of malignant neoplasm of prostate: Secondary | ICD-10-CM | POA: Insufficient documentation

## 2024-05-04 DIAGNOSIS — I629 Nontraumatic intracranial hemorrhage, unspecified: Secondary | ICD-10-CM | POA: Diagnosis not present

## 2024-05-04 DIAGNOSIS — Z79899 Other long term (current) drug therapy: Secondary | ICD-10-CM | POA: Diagnosis not present

## 2024-05-04 LAB — TROPONIN T, HIGH SENSITIVITY
Troponin T High Sensitivity: 47 ng/L — ABNORMAL HIGH (ref 0–19)
Troponin T High Sensitivity: 40 ng/L — ABNORMAL HIGH (ref 0–19)

## 2024-05-04 LAB — CBC WITH DIFFERENTIAL/PLATELET
Abs Immature Granulocytes: 0.03 K/uL (ref 0.00–0.07)
Basophils Absolute: 0.1 K/uL (ref 0.0–0.1)
Basophils Relative: 1 %
Eosinophils Absolute: 0.6 K/uL — ABNORMAL HIGH (ref 0.0–0.5)
Eosinophils Relative: 7 %
HCT: 39.8 % (ref 39.0–52.0)
Hemoglobin: 12.4 g/dL — ABNORMAL LOW (ref 13.0–17.0)
Immature Granulocytes: 0 %
Lymphocytes Relative: 13 %
Lymphs Abs: 1.2 K/uL (ref 0.7–4.0)
MCH: 31.1 pg (ref 26.0–34.0)
MCHC: 31.2 g/dL (ref 30.0–36.0)
MCV: 99.7 fL (ref 80.0–100.0)
Monocytes Absolute: 0.6 K/uL (ref 0.1–1.0)
Monocytes Relative: 7 %
Neutro Abs: 6.7 K/uL (ref 1.7–7.7)
Neutrophils Relative %: 72 %
Platelets: 239 K/uL (ref 150–400)
RBC: 3.99 MIL/uL — ABNORMAL LOW (ref 4.22–5.81)
RDW: 15.7 % — ABNORMAL HIGH (ref 11.5–15.5)
WBC: 9.2 K/uL (ref 4.0–10.5)
nRBC: 0 % (ref 0.0–0.2)

## 2024-05-04 LAB — URINALYSIS, MICROSCOPIC (REFLEX)
Bacteria, UA: NONE SEEN
Squamous Epithelial / HPF: NONE SEEN /HPF (ref 0–5)

## 2024-05-04 LAB — T4, FREE: Free T4: 1.4 ng/dL (ref 0.80–2.00)

## 2024-05-04 LAB — COMPREHENSIVE METABOLIC PANEL WITH GFR
ALT: 75 U/L — ABNORMAL HIGH (ref 0–44)
AST: 39 U/L (ref 15–41)
Albumin: 3.9 g/dL (ref 3.5–5.0)
Alkaline Phosphatase: 131 U/L — ABNORMAL HIGH (ref 38–126)
Anion gap: 13 (ref 5–15)
BUN: 35 mg/dL — ABNORMAL HIGH (ref 8–23)
CO2: 21 mmol/L — ABNORMAL LOW (ref 22–32)
Calcium: 9.4 mg/dL (ref 8.9–10.3)
Chloride: 110 mmol/L (ref 98–111)
Creatinine, Ser: 1.95 mg/dL — ABNORMAL HIGH (ref 0.61–1.24)
GFR, Estimated: 33 mL/min — ABNORMAL LOW
Glucose, Bld: 121 mg/dL — ABNORMAL HIGH (ref 70–99)
Potassium: 4.3 mmol/L (ref 3.5–5.1)
Sodium: 143 mmol/L (ref 135–145)
Total Bilirubin: 0.5 mg/dL (ref 0.0–1.2)
Total Protein: 6.7 g/dL (ref 6.5–8.1)

## 2024-05-04 LAB — URINALYSIS, ROUTINE W REFLEX MICROSCOPIC
Bilirubin Urine: NEGATIVE
Glucose, UA: NEGATIVE mg/dL
Hgb urine dipstick: NEGATIVE
Ketones, ur: NEGATIVE mg/dL
Leukocytes,Ua: NEGATIVE
Nitrite: NEGATIVE
Protein, ur: 30 mg/dL — AB
Specific Gravity, Urine: 1.017 (ref 1.005–1.030)
pH: 5 (ref 5.0–8.0)

## 2024-05-04 LAB — MAGNESIUM: Magnesium: 2.3 mg/dL (ref 1.7–2.4)

## 2024-05-04 LAB — TSH: TSH: 5.85 u[IU]/mL — ABNORMAL HIGH (ref 0.350–4.500)

## 2024-05-04 MED ORDER — DILTIAZEM HCL ER COATED BEADS 120 MG PO CP24: 120.0000 mg | ORAL_CAPSULE | Freq: Every day | ORAL | 0 refills | Status: DC

## 2024-05-04 MED ORDER — AMIODARONE HCL 200 MG PO TABS: 100.0000 mg | ORAL_TABLET | Freq: Every day | ORAL | 0 refills | Status: DC

## 2024-05-04 MED ORDER — AMIODARONE HCL 200 MG PO TABS
100.0000 mg | ORAL_TABLET | Freq: Every day | ORAL | Status: DC
  Administered 2024-05-04: 100 mg via ORAL
  Filled 2024-05-04: qty 1

## 2024-05-04 MED ORDER — ATORVASTATIN CALCIUM 20 MG PO TABS
10.0000 mg | ORAL_TABLET | Freq: Every day | ORAL | Status: DC
  Administered 2024-05-04: 10 mg via ORAL
  Filled 2024-05-04: qty 1

## 2024-05-04 MED ORDER — METOPROLOL TARTRATE 50 MG PO TABS: 50.0000 mg | ORAL_TABLET | Freq: Two times a day (BID) | ORAL | 0 refills | Status: DC

## 2024-05-04 MED ORDER — SODIUM CHLORIDE 0.9 % IV BOLUS (SEPSIS)
500.0000 mL | Freq: Once | INTRAVENOUS | Status: AC
  Administered 2024-05-04: 500 mL via INTRAVENOUS

## 2024-05-04 MED ORDER — ACETAMINOPHEN 325 MG PO TABS: 650.0000 mg | ORAL_TABLET | ORAL | Status: DC | PRN

## 2024-05-04 MED ORDER — AMIODARONE HCL 200 MG PO TABS: 200.0000 mg | ORAL_TABLET | Freq: Every day | ORAL | Status: DC

## 2024-05-04 MED ORDER — METOPROLOL TARTRATE 25 MG PO TABS
25.0000 mg | ORAL_TABLET | Freq: Two times a day (BID) | ORAL | Status: DC
  Filled 2024-05-04: qty 1

## 2024-05-04 MED ORDER — DILTIAZEM HCL-DEXTROSE 125-5 MG/125ML-% IV SOLN (PREMIX)
5.0000 mg/h | INTRAVENOUS | Status: DC
  Administered 2024-05-04: 5 mg/h via INTRAVENOUS
  Filled 2024-05-04 (×2): qty 125

## 2024-05-04 MED ORDER — APIXABAN 2.5 MG PO TABS
2.5000 mg | ORAL_TABLET | Freq: Two times a day (BID) | ORAL | Status: DC
  Filled 2024-05-04: qty 1

## 2024-05-04 MED ORDER — ONDANSETRON HCL 4 MG/2ML IJ SOLN: 4.0000 mg | Freq: Four times a day (QID) | INTRAMUSCULAR | Status: DC | PRN

## 2024-05-04 MED ORDER — DILTIAZEM HCL ER COATED BEADS 120 MG PO CP24
120.0000 mg | ORAL_CAPSULE | Freq: Every day | ORAL | Status: DC
  Administered 2024-05-04: 120 mg via ORAL
  Filled 2024-05-04: qty 1

## 2024-05-04 MED ORDER — METOPROLOL TARTRATE 50 MG PO TABS
50.0000 mg | ORAL_TABLET | Freq: Two times a day (BID) | ORAL | Status: DC
  Administered 2024-05-04: 50 mg via ORAL
  Filled 2024-05-04: qty 1

## 2024-05-04 MED ORDER — ALPRAZOLAM 0.25 MG PO TABS: 0.2500 mg | ORAL_TABLET | Freq: Two times a day (BID) | ORAL | Status: DC | PRN

## 2024-05-04 NOTE — Hospital Course (Signed)
 HPI: Timothy Goodwin is a pleasant 89 y.o. male with medical history significant for A-fib on metoprolol  and amiodarone  but not on anticoagulation since December 2025 due to brain bleed, HTN, HLD, CAD, CKD, prostate cancer who presented to ED complaining of feeling unwell started last night.  Patient stated that he felt abnormal and got very shaky.  He stated that he felt like his heart was racing.  Denied any chest pain, fever, cough, congestion, shortness of breath, nausea, vomiting, diarrhea, urinary symptoms.  Patient stated that he took his prescribed medications without any relief.  He is currently wearing a Holter monitor for the last 2 weeks.  EMS reported he was on A-fib with RVR.  No medication was given and route.  Heart rate fluctuating between upper 90s to 120s.   ED Course: Upon arrival to the ED, patient is found to be in A-fib.  Hypertensive at 172/111, tachypneic at 25.  WBC was 9.2, troponin were 47 and 40, urine analysis did not show any signs of infection, chest x-ray did not show any signs of acute infiltrate.  Patient was started on diltiazem  infusion, IV fluid bolus.  Hospitalist service was consulted for evaluation for admission.

## 2024-05-04 NOTE — Consult Note (Addendum)
 "  CARDIOLOGY CONSULT NOTE               Patient ID: Timothy Goodwin MRN: [id number] DOB/AGE: Nov 21, 1934 89 y.o.  Admit date: 05/04/2024 Referring Physician Dr Nena Rebel hospitalist Primary Physician Dr. Sadie primary Primary Cardiologist [facility] Reason for Consultation atrial fibrillation  HPI: 89 year old history of atrial fibrillation on metoprolol  and amiodarone  currently not on anticoagulation since December because of intracranial hemorrhage.  Hypertension hyperlipidemia coronary disease renal insufficiency previous prostate cancer history of coronary bypass surgery stated been doing reasonably well last night had episodes of palpitation last ease in his chest area and stabbing feeling could not sleep heart felt it was racing she came to emergency room is found to be in rapid atrial fibrillation up to 120s.  History of hypertension tachypnea placed on diltiazem  infusion and fluid bolus with flat troponins cardiology was consulted for further evaluation  Review of systems complete and found to be negative unless listed above     Past Medical History:  Diagnosis Date   Actinic keratosis    Basal cell carcinoma 11/02/2019   upper back spinal    Carotid artery stenosis    CKD (chronic kidney disease), stage III (HCC)    Coronary artery disease    Gout    Hx of CABG 04/05/2018   2v; LIMA-LAD, SVG-PL   Hyperlipidemia    Hypertension    Left inguinal hernia    NSTEMI (non-ST elevated myocardial infarction) (HCC) 12/25/2013   NSTEMI (non-ST elevated myocardial infarction) (HCC) 03/30/2018   Peripheral vascular disease    Postoperative atrial fibrillation (HCC)    Prostate cancer (HCC) 1995   Right renal artery stenosis     Past Surgical History:  Procedure Laterality Date   CARDIAC CATHETERIZATION N/A 12/27/2013   Location: [facility]; Surgeon: Margie Lovelace, MD   CORONARY ANGIOPLASTY WITH STENT PLACEMENT N/A 2000   OM1 stents (unknown type) placed; Location: [facility]   CORONARY ARTERY BYPASS GRAFT N/A 04/05/2018   2v; LIMA-LAD, SVG-PL; Location: [facility]; Surgeon: Morene Keens, MD   LEFT HEART CATH AND CORONARY ANGIOGRAPHY N/A 03/30/2018   Procedure: LEFT HEART CATH AND CORONARY ANGIOGRAPHY and possible PCI and stent;  Surgeon: Lovelace Cara BIRCH, MD;  Location: [facility] INVASIVE CV LAB;  Service: Cardiovascular;  Laterality: N/A;   PROSTATECTOMY  1995   SMALL BOWEL ENTEROSCOPY  2019   TEE WITH CARDIOVERSION N/A 04/10/2018   Required 3 cardioversions/shocks to briefly restore NSR; A.fib refractory; Location: [facility]   TONSILLECTOMY     XI ROBOTIC ASSISTED INGUINAL HERNIA REPAIR WITH MESH Left 10/30/2020   Procedure: XI ROBOTIC ASSISTED INGUINAL HERNIA REPAIR WITH MESH;  Surgeon: Lane Shope, MD;  Location: [facility] ORS;  Service: General;  Laterality: Left;    (Not in a hospital admission)  Social History   Socioeconomic History   Marital status: Married    Spouse name: Not on file   Number of children: Not on file   Years of education: Not on file   Highest education level: Not on file  Occupational History   Not on file  Tobacco Use   Smoking status: Never   Smokeless tobacco: Never  Vaping Use   Vaping status: Never Used  Substance and Sexual Activity   Alcohol use: Never   Drug use: No   Sexual activity: Not on file  Other Topics Concern   Not on file  Social History Narrative   Not on file   Social Drivers of Health  Tobacco Use: Low Risk (05/04/2024)   Patient History    Smoking Tobacco Use: Never    Smokeless Tobacco Use: Never    Passive Exposure: Not on file  Financial Resource Strain: Low Risk  (02/28/2024)   Received from [facility]   Overall Financial Resource Strain (CARDIA)    Difficulty of Paying Living Expenses: Not hard at all  Food Insecurity: No Food Insecurity (04/07/2024)   Epic    Worried About Running Out of Food in the Last Year: Never true    Ran Out of Food in the Last Year:  Never true  Transportation Needs: No Transportation Needs (04/07/2024)   Epic    Lack of Transportation (Medical): No    Lack of Transportation (Non-Medical): No  Physical Activity: Not on file  Stress: Not on file  Social Connections: Socially Integrated (04/07/2024)   Social Connection and Isolation Panel    Frequency of Communication with Friends and Family: More than three times a week    Frequency of Social Gatherings with Friends and Family: More than three times a week    Attends Religious Services: More than 4 times per year    Active Member of [employer] or Organizations: Yes    Attends Banker Meetings: More than 4 times per year    Marital Status: Married  Catering Manager Violence: Not At Risk (04/07/2024)   Epic    Fear of Current or Ex-Partner: No    Emotionally Abused: No    Physically Abused: No    Sexually Abused: No  Depression (PHQ2-9): Not on file  Alcohol Screen: Not on file  Housing: Low Risk  (04/18/2024)   Received from [facility]   Epic    In the last 12 months, was there a time when you were not able to pay the mortgage or rent on time?: No    In the past 12 months, how many times have you moved where you were living?: 0    At any time in the past 12 months, were you homeless or living in a shelter (including now)?: No  Utilities: Not At Risk (04/07/2024)   Epic    Threatened with loss of utilities: No  Health Literacy: Not on file    Family History  Problem Relation Age of Onset   Prostate cancer Father       Review of systems complete and found to be negative unless listed above      PHYSICAL EXAM  General: Well developed, well nourished, in no acute distress HEENT:  Normocephalic and atramatic Neck:  No JVD.  Lungs: Clear bilaterally to auscultation and percussion. Heart: Irregular irregular. Normal S1 and S2 without gallops or murmurs.  Abdomen: Bowel sounds are positive, abdomen soft and non-tender  Msk:   Back normal, normal gait. Normal strength and tone for age. Extremities: No clubbing, cyanosis or edema.   Neuro: Alert and oriented X 3. Psych:  Good affect, responds appropriately  Labs:   Lab Results  Component Value Date   WBC 9.2 05/04/2024   HGB 12.4 (L) 05/04/2024   HCT 39.8 05/04/2024   MCV 99.7 05/04/2024   PLT 239 05/04/2024    Recent Labs  Lab 05/04/24 0149  NA 143  K 4.3  CL 110  CO2 21*  BUN 35*  CREATININE 1.95*  CALCIUM  9.4  PROT 6.7  BILITOT 0.5  ALKPHOS 131*  ALT 75*  AST 39  GLUCOSE 121*   Lab Results  Component Value Date   CKMB 56.6 (H) 12/25/2013   TROPONINI 0.86 (HH) 03/29/2018    Lab Results  Component Value Date   CHOL 261 (H) 12/25/2013   Lab Results  Component Value Date   HDL 39 (L) 12/25/2013   Lab Results  Component Value Date   LDLCALC 178 (H) 12/25/2013   Lab Results  Component Value Date   TRIG 221 (H) 12/25/2013   No results found for: CHOLHDL No results found for: LDLDIRECT    Radiology: DG Chest Portable 1 View Result Date: 05/04/2024 CLINICAL DATA:  Chest pain EXAM: PORTABLE CHEST 1 VIEW COMPARISON:  04/06/2024 FINDINGS: Cardiac shadow is enlarged. Postsurgical changes are noted. Aortic calcifications are again seen. Elevation of left hemidiaphragm is noted. The lungs are otherwise well aerated. No focal infiltrate is seen. No sizable effusion is noted. No acute bony abnormality is seen. IMPRESSION: Chronic changes without acute abnormality. Electronically Signed   By: Oneil Devonshire M.D.   On: 05/04/2024 02:32   CT HEAD WO CONTRAST ( ) Result Date: 04/17/2024 EXAM: CT HEAD WITHOUT CONTRAST 04/14/2024 01:20:34 PM TECHNIQUE: CT of the head was performed without the administration of intravenous contrast. Automated exposure control, iterative reconstruction, and/or weight based adjustment of the mA/kV was utilized to reduce the radiation dose to as low as reasonably achievable. COMPARISON: CT head 04/06/2024. CLINICAL  HISTORY: Subdural hematoma. FINDINGS: BRAIN AND VENTRICLES: There is an overall similar right frontoparietal convexity hypoattenuating extra-axial fluid collection measuring up to 0.5 cm from the inner table. No significant associated mass effect or midline shift. No hyperattenuating component to suggest recent hemorrhage. No new or increasing intracranial hemorrhage. There are overall mild similar scattered white matter hypodensities which are nonspecific but most commonly represent chronic microvascular ischemic changes. No evidence of acute infarct. No hydrocephalus. ORBITS: No acute abnormality. SINUSES: No acute abnormality. SOFT TISSUES AND SKULL: No acute soft tissue abnormality. No skull fracture. IMPRESSION: 1. 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 No evidence of new or increasing intracranial hemorrhage. Electronically signed by: Prentice Spade MD 04/17/2024 08:46 AM EST RP Workstation: [id number]   NM Hepato W/EF Result Date: 04/07/2024 EXAM: NM HEPATOBILLARY SCAN 04/07/2024 10:26:42 AM TECHNIQUE: RADIOPHARMACEUTICAL: 5.48 mCi Tc-29m mebrofenin  Anterior planar images of the abdomen and pelvis were obtained over approximately 1 hour. Ensure plus or equivalent fatty meal was given by mouth. Dynamic imaging of the abdomen is obtained, and quantitative analysis is performed. COMPARISON: None available. CLINICAL HISTORY: Cholecystitis. FINDINGS: Homogenous uptake within the liver. Normal clearance of the blood pool. Appropriate excretion into the biliary system. Activity is visualized in the small bowel. Activity is visualized in the gallbladder. Normal filling of the gallbladder. Gallbladder ejection fraction measures: 82% Normal value is >33% for Ensure protocol. Patient reported symptoms during infusion: None. IMPRESSION: 1. Normal filling of the gallbladder. 2.  Gallbladder ejection  fraction of 82%. Electronically signed by: Norleen Boxer MD 04/07/2024 10:53 AM EST RP Workstation: [id number]   US  ABDOMEN LIMITED RUQ (LIVER/GB) Result Date: 04/06/2024 CLINICAL DATA:  [id number] Transaminitis [id number] EXAM: ULTRASOUND ABDOMEN LIMITED RIGHT UPPER QUADRANT COMPARISON:  02/23/2024 FINDINGS: Gallbladder: Decompressed containing multiple shadowing gallstones. Circumferential wall thickening with gallbladder wall edema. Small amount of pericholecystic fluid is also present. No sonographic Murphy's sign noted by sonographer. Common bile duct: Diameter: 6 mm Liver: Normal echogenicity. No focal lesion identified. No intrahepatic biliary ductal dilation. Portal vein is patent on color Doppler imaging with normal  direction of blood flow towards the liver. Right Kidney: Partially visualized. No mass. No hydronephrosis or nephrolithiasis. Other: None. IMPRESSION: Decompressed gallbladder containing multiple gallstones. Mild gallbladder wall thickening and edema with trace pericholecystic fluid. In the absence of a sonographic Murphy's sign, the study is equivocal for acute cholecystitis. These changes could be due to acute hepatitis, related to intra-abdominal inflammation, hypoproteinemia, or volume overload from CHF or renal failure. Laboratory correlation recommended. Electronically Signed   By: Rogelia Myers M.D.   On: 04/06/2024 14:10   DG Humerus Left Result Date: 04/06/2024 CLINICAL DATA:  mid shaft pain (mild) after fall EXAM: LEFT HUMERUS - 2+ VIEW COMPARISON:  None Available. FINDINGS: Osteopenia.No acute fracture or dislocation. There is no evidence of arthropathy or other focal bone abnormality. Soft tissues are unremarkable. IMPRESSION: No acute fracture or dislocation. Electronically Signed   By: Rogelia Myers M.D.   On: 04/06/2024 13:34   CT Cervical Spine Wo Contrast Result Date: 04/06/2024 CLINICAL DATA:  Syncopal episode with fall. EXAM: CT CERVICAL SPINE WITHOUT CONTRAST  TECHNIQUE: Multidetector CT imaging of the cervical spine was performed without intravenous contrast. Multiplanar CT image reconstructions were also generated. RADIATION DOSE REDUCTION: This exam was performed according to the departmental dose-optimization program which includes automated exposure control, adjustment of the mA and/or kV according to patient size and/or use of iterative reconstruction technique. COMPARISON:  02/23/2024 FINDINGS: Alignment: No posttraumatic subluxation. Subtle stable degenerative posterior subluxation of C3 on C4 and of C5 and C6. Skull base and vertebrae: Moderate spondylosis of the cervical spine to include uncovertebral joint spurring and facet arthropathy. Vertebral body heights are maintained. Stable minimally displaced left C7 transverse process fracture. No additional fractures visualized. Moderate bilateral neural foraminal narrowing at the C3-4 level, C4-5 and C5-6 levels and to lesser extent at the C6-7 level. Soft tissues and spinal canal: No prevertebral fluid or swelling. No visible canal hematoma. Disc levels: Moderate disc space narrowing from the C3-4 level to the C6-7 level. Upper chest: No acute findings. Other: Old left posterior first and second rib fractures. IMPRESSION: 1. Stable minimally displaced left C7 transverse process fracture. No additional fractures visualized. 2. Moderate spondylosis of the cervical spine with multilevel disc disease and bilateral neural foraminal narrowing as described. Electronically Signed   By: Toribio Agreste M.D.   On: 04/06/2024 12:15   CT Chest Wo Contrast Result Date: 04/06/2024 CLINICAL DATA:  Left rib pain after multiple falls EXAM: CT CHEST WITHOUT CONTRAST TECHNIQUE: Multidetector CT imaging of the chest was performed following the standard protocol without IV contrast. RADIATION DOSE REDUCTION: This exam was performed according to the departmental dose-optimization program which includes automated exposure control,  adjustment of the mA and/or kV according to patient size and/or use of iterative reconstruction technique. COMPARISON:  February 23, 2024 FINDINGS: Cardiovascular: Status post coronary artery bypass graft. Aortic atherosclerosis. No evidence of thoracic aortic aneurysm. Normal cardiac size. No pericardial effusion. Mediastinum/Nodes: No enlarged mediastinal or axillary lymph nodes. Thyroid gland, trachea, and esophagus demonstrate no significant findings. Lungs/Pleura: No pneumothorax is noted. Minimal left pleural effusion is noted with associated mild left posterior basilar atelectasis or infiltrate. 5 mm nodule is noted in right middle lobe best seen on image number 115 of series 3. This is unchanged compared to prior exam. 3 mm nodule is noted in left upper lobe best seen on image number 86 of series 3. This is also unchanged. Upper Abdomen: No acute abnormality. Musculoskeletal: No chest wall mass or suspicious bone lesions identified.  IMPRESSION: 1. Minimal left pleural effusion is noted with associated mild left posterior basilar atelectasis or infiltrate. 2. Stable bilateral pulmonary nodules are noted, the largest measuring 5 mm in the right middle lobe. No follow-up needed if patient is low-risk (and has no known or suspected primary neoplasm). Non-contrast chest CT can be considered in 12 months if patient is high-risk. This recommendation follows the consensus statement: Guidelines for Management of Incidental Pulmonary Nodules Detected on CT Images: From the Fleischner Society 2017; Radiology 2017; 284:228-243. 3. Status post coronary artery bypass graft. 4. Aortic atherosclerosis. Aortic Atherosclerosis (ICD10-I70.0). Electronically Signed   By: Lynwood Landy Raddle M.D.   On: 04/06/2024 12:14   DG Ribs Unilateral W/Chest Left Result Date: 04/06/2024 CLINICAL DATA:  rib injury EXAM: LEFT RIBS AND CHEST - 3+ VIEW COMPARISON:  03/08/2024 FINDINGS: Sternotomy wires and CABG markers. Elevation of the left  hemidiaphragm with left basilar atelectasis. Low lung volumes with bronchovascular crowding. No focal airspace consolidation, pleural effusion, or pneumothorax. No cardiomegaly. Subtle cortical irregularity of the anterior left eighth rib. IMPRESSION: Subtle cortical irregularity of the anterior left eighth rib, worrisome for a nondisplaced fracture. No pneumothorax. Electronically Signed   By: Rogelia Myers M.D.   On: 04/06/2024 11:49   CT HEAD WO CONTRAST Result Date: 04/06/2024 EXAM: CT HEAD WITHOUT CONTRAST 04/06/2024 10:54:37 AM TECHNIQUE: CT of the head was performed without the administration of intravenous contrast. Automated exposure control, iterative reconstruction, and/or weight based adjustment of the mA/kV was utilized to reduce the radiation dose to as low as reasonably achievable. COMPARISON: 02/23/2024 CLINICAL HISTORY: Head trauma, moderate-severe Pt c/o syncopal episode, reports he went to stand up and woke up on the floor. Pt states he has had 2 falls. Pt c/o L rib pain. Pt has an abrasion to the L side of his head, FINDINGS: BRAIN AND VENTRICLES: New 4 mm thick low-density subacute to chronic right subdural hematoma. No acute extraaxial fluid collection. Patchy and confluent areas of decreased attenuation are noted throughout the deep and periventricular white matter of the cerebral hemispheres bilaterally suggestive of chronic microvascular ischemic changes. Cerebral ventricle sizes are concordant with the degree of cerebral volume loss. Atrophy and chronic small vessel disease changes are also noted. No evidence of acute infarct. No hydrocephalus. No mass effect or midline shift. ORBITS: No acute abnormality. SINUSES: No acute abnormality. SOFT TISSUES AND SKULL: No acute soft tissue abnormality. No skull fracture. IMPRESSION: 1. Subacute to chronic 4 mm right subdural hematoma. No acute component identified. 2. No acute findings. Electronically signed by: Morgane Naveau MD 04/06/2024  11:11 AM EST RP Workstation: [id number]    EKG: Atrial fibrillation rate of 110 nonspecific ST-T wave changes  ASSESSMENT AND PLAN:  Atrial fibrillation RVR Palpitation Hypertension Chronic renal sufficiency stage IIIb Multivessel coronary disease Coronary bypass surgery Hyperlipidemia Congestive heart failure diastolic Intracranial hemorrhage . Plan Agree with observation for atrial fibrillation RVR recommend continue IV Cardizem  transition to p.o. Cardizem  or metoprolol  Defer anticoagulation because of recent intracranial hemorrhage until cleared by neurology or neurosurgery Will consider watchman since anticoagulation may be problematic Amiodarone  for rhythm control Continue Cardizem  or beta-blocker for palpitation Recent echocardiogram showed preserved left ventricular function of around 50% Hyperlipidemia maintain Lipitor therapy for lipid management Renal insufficiency stage IIIb maintain adequate hydration follow-up with nephrology Recommend conservative management at this stage Patient should be able to be discharged home on p.o. medicines Target rate control is around 100 or less Have the patient follow-up with cardiology 1 to 2  weeks  Signed: Cara JONETTA Lovelace MD 05/04/2024, 7:41 AM      "

## 2024-05-04 NOTE — ED Triage Notes (Signed)
 Pt to ED from home with c/o palpitations x2 hours. Hx of afib, took prescribed metoprolol  without relief. Wearing halter monitor for the last 2 weeks.. Denies chest pain or shortness of breath

## 2024-05-04 NOTE — ED Provider Notes (Signed)
 "  [facility] Provider Note    Event Date/Time   First MD Initiated Contact with Patient 05/04/24 0120     (approximate)   History   Palpitations   HPI  Timothy Goodwin is a 89 y.o. male with history of atrial fibrillation on metoprolol  but not on anticoagulation, hypertension, hyperlipidemia, CAD, CKD, prostate cancer who presents to the emergency department with feeling unwell that started tonight.  States he started to feel abnormal and got very shaky.  He denies fevers, cough, congestion, chest pain, shortness of breath, vomiting, diarrhea, urinary symptoms.  EMS reports patient was in A-fib with RVR.  No medications given in route.  Heart rate fluctuating between the upper 90s to low 120s.  States he is now feeling better.   History provided by patient and EMS.    Past Medical History:  Diagnosis Date   Actinic keratosis    Basal cell carcinoma 11/02/2019   upper back spinal    Carotid artery stenosis    CKD (chronic kidney disease), stage III (HCC)    Coronary artery disease    Gout    Hx of CABG 04/05/2018   2v; LIMA-LAD, SVG-PL   Hyperlipidemia    Hypertension    Left inguinal hernia    NSTEMI (non-ST elevated myocardial infarction) (HCC) 12/25/2013   NSTEMI (non-ST elevated myocardial infarction) (HCC) 03/30/2018   Peripheral vascular disease    Postoperative atrial fibrillation (HCC)    Prostate cancer (HCC) 1995   Right renal artery stenosis     Past Surgical History:  Procedure Laterality Date   CARDIAC CATHETERIZATION N/A 12/27/2013   Location: [facility]; Surgeon: Margie Lovelace, MD   CORONARY ANGIOPLASTY WITH STENT PLACEMENT N/A 2000   OM1 stents (unknown type) placed; Location: [facility]   CORONARY ARTERY BYPASS GRAFT N/A 04/05/2018   2v; LIMA-LAD, SVG-PL; Location: [facility]; Surgeon: Morene Keens, MD   LEFT HEART CATH AND CORONARY ANGIOGRAPHY N/A 03/30/2018   Procedure: LEFT HEART CATH AND CORONARY ANGIOGRAPHY and  possible PCI and stent;  Surgeon: Lovelace Cara BIRCH, MD;  Location: [facility] INVASIVE CV LAB;  Service: Cardiovascular;  Laterality: N/A;   PROSTATECTOMY  1995   SMALL BOWEL ENTEROSCOPY  2019   TEE WITH CARDIOVERSION N/A 04/10/2018   Required 3 cardioversions/shocks to briefly restore NSR; A.fib refractory; Location: [facility]   TONSILLECTOMY     XI ROBOTIC ASSISTED INGUINAL HERNIA REPAIR WITH MESH Left 10/30/2020   Procedure: XI ROBOTIC ASSISTED INGUINAL HERNIA REPAIR WITH MESH;  Surgeon: Lane Shope, MD;  Location: [facility] ORS;  Service: General;  Laterality: Left;    MEDICATIONS:  Prior to Admission medications  Medication Sig Start Date End Date Taking? Authorizing Provider  allopurinol  (ZYLOPRIM ) 100 MG tablet Take 100 mg by mouth daily. 03/27/16   [provider]  amiodarone  (PACERONE ) 200 MG tablet Take 1 tablet (200 mg total) by mouth daily. Home med. 04/08/24   Awanda City, MD  [Paused] apixaban  (ELIQUIS ) 2.5 MG TABS tablet Take 1 tablet (2.5 mg total) by mouth 2 (two) times daily. Patient not taking: Reported on 04/17/2024 Wait to take this until your doctor or other care provider tells you to start again. 11/14/23   Arlon Carliss ORN, DO  atorvastatin  (LIPITOR) 10 MG tablet Take 10 mg by mouth daily. 03/19/24   [provider]  metoprolol  tartrate (LOPRESSOR ) 25 MG tablet Take 1 tablet (25 mg total) by mouth 2 (two) times daily. Home med. 04/08/24   Awanda City, MD  Physical Exam   Triage Vital Signs: ED Triage Vitals  Encounter Vitals Group     BP 05/04/24 0123 (!) 154/96     Girls Systolic BP Percentile --      Girls Diastolic BP Percentile --      Boys Systolic BP Percentile --      Boys Diastolic BP Percentile --      Pulse Rate 05/04/24 0123 98     Resp 05/04/24 0123 16     Temp 05/04/24 0123 (!) 97.5 F (36.4 C)     Temp Source 05/04/24 0123 Oral     SpO2 05/04/24 0123 99 %     Weight 05/04/24 0122 141 lb 15.6 oz (64.4 kg)     Height 05/04/24 0122 5' 4  (1.626 m)     Head Circumference --      Peak Flow --      Pain Score 05/04/24 0122 0     Pain Loc --      Pain Education --      Exclude from Growth Chart --     Most recent vital signs: Vitals:   05/04/24 0700 05/04/24 0800  BP: (!) 170/94 (!) 172/111  Pulse: 60 78  Resp: 14 (!) 25  Temp:    SpO2: 98% 100%    CONSTITUTIONAL: Alert, responds appropriately to questions.  Elderly, no distress HEAD: Normocephalic, atraumatic EYES: Conjunctivae clear, pupils appear equal, sclera nonicteric ENT: normal nose; moist mucous membranes NECK: Supple, normal ROM CARD: Irregularly irregular and tachycardic; S1 and S2 appreciated RESP: Normal chest excursion without splinting or tachypnea; breath sounds clear and equal bilaterally; no wheezes, no rhonchi, no rales, no hypoxia or respiratory distress, speaking full sentences ABD/GI: Non-distended; soft, non-tender, no rebound, no guarding, no peritoneal signs BACK: The back appears normal EXT: Normal ROM in all joints; no deformity noted, no edema, no calf tenderness or calf swelling SKIN: Normal color for age and race; warm; no rash on exposed skin NEURO: Moves all extremities equally, normal speech PSYCH: The patient's mood and manner are appropriate.   ED Results / Procedures / Treatments   LABS: (all labs ordered are listed, but only abnormal results are displayed) Labs Reviewed  CBC WITH DIFFERENTIAL/PLATELET - Abnormal; Notable for the following components:      Result Value   RBC 3.99 (*)    Hemoglobin 12.4 (*)    RDW 15.7 (*)    Eosinophils Absolute 0.6 (*)    All other components within normal limits  COMPREHENSIVE METABOLIC PANEL WITH GFR - Abnormal; Notable for the following components:   CO2 21 (*)    Glucose, Bld 121 (*)    BUN 35 (*)    Creatinine, Ser 1.95 (*)    ALT 75 (*)    Alkaline Phosphatase 131 (*)    GFR, Estimated 33 (*)    All other components within normal limits  TSH - Abnormal; Notable for the  following components:   TSH 5.850 (*)    All other components within normal limits  URINALYSIS, ROUTINE W REFLEX MICROSCOPIC - Abnormal; Notable for the following components:   Protein, ur 30 (*)    All other components within normal limits  TROPONIN T, HIGH SENSITIVITY - Abnormal; Notable for the following components:   Troponin T High Sensitivity 47 (*)    All other components within normal limits  TROPONIN T, HIGH SENSITIVITY - Abnormal; Notable for the following components:   Troponin T High Sensitivity 40 (*)  All other components within normal limits  MAGNESIUM   T4, FREE  URINALYSIS, MICROSCOPIC (REFLEX)     EKG:  EKG Interpretation Date/Time:  Thursday May 04 2024 01:29:57 EST Ventricular Rate:  109 PR Interval:    QRS Duration:  108 QT Interval:  388 QTC Calculation: 523 R Axis:   28  Text Interpretation: Atrial flutter Probable LVH with secondary repol abnrm Prolonged QT interval Confirmed by Neomi Neptune [id number]) on 05/04/2024 1:43:03 AM         RADIOLOGY: My personal review and interpretation of imaging: Chest x-ray clear.  I have personally reviewed all radiology reports.   DG Chest Portable 1 View Result Date: 05/04/2024 CLINICAL DATA:  Chest pain EXAM: PORTABLE CHEST 1 VIEW COMPARISON:  04/06/2024 FINDINGS: Cardiac shadow is enlarged. Postsurgical changes are noted. Aortic calcifications are again seen. Elevation of left hemidiaphragm is noted. The lungs are otherwise well aerated. No focal infiltrate is seen. No sizable effusion is noted. No acute bony abnormality is seen. IMPRESSION: Chronic changes without acute abnormality. Electronically Signed   By: Oneil Devonshire M.D.   On: 05/04/2024 02:32     PROCEDURES:  Critical Care performed: Yes, see critical care procedure note(s)   CRITICAL CARE Performed by: Neptune Christiaan Strebeck   Total critical care time: 30 minutes  Critical care time was exclusive of separately billable procedures and treating other  patients.  Critical care was necessary to treat or prevent imminent or life-threatening deterioration.  Critical care was time spent personally by me on the following activities: development of treatment plan with patient and/or surrogate as well as nursing, discussions with consultants, evaluation of patient's response to treatment, examination of patient, obtaining history from patient or surrogate, ordering and performing treatments and interventions, ordering and review of laboratory studies, ordering and review of radiographic studies, pulse oximetry and re-evaluation of patient's condition.   SABRA1-3 Lead EKG Interpretation  Performed by: Soua Caltagirone, Neptune SAILOR, DO Authorized by: Angelise Petrich, Neptune SAILOR, DO     Interpretation: abnormal     ECG rate:  122     IMPRESSION / MDM / ASSESSMENT AND PLAN / ED COURSE  I reviewed the triage vital signs and the nursing notes.    Patient here with A-fib with RVR.  History of the same.  Not on any blood thinners.  The patient is on the cardiac monitor to evaluate for evidence of arrhythmia and/or significant heart rate changes.   DIFFERENTIAL DIAGNOSIS (includes but not limited to):   A-fib with RVR, anemia, electrolyte derangement, thyroid dysfunction, dehydration, ACS, less likely PE or dissection   Patient's presentation is most consistent with acute presentation with potential threat to life or bodily function.   PLAN: Will obtain labs, urine, chest x-ray.  Will start IV diltiazem .  Rate in the 120s intermittently here.  Denies any complaints currently.  Hemodynamically stable otherwise.   MEDICATIONS GIVEN IN ED: Medications  diltiazem  (CARDIZEM ) 125 mg in dextrose  5% 125 mL (1 mg/mL) infusion (5 mg/hr Intravenous New Bag/Given 05/04/24 0304)  atorvastatin  (LIPITOR) tablet 10 mg (has no administration in time range)  metoprolol  tartrate (LOPRESSOR ) tablet 25 mg (has no administration in time range)  acetaminophen  (TYLENOL ) tablet 650 mg (has no  administration in time range)  ondansetron  (ZOFRAN ) injection 4 mg (has no administration in time range)  apixaban  (ELIQUIS ) tablet 2.5 mg (has no administration in time range)  ALPRAZolam  (XANAX ) tablet 0.25 mg (has no administration in time range)  amiodarone  (PACERONE ) tablet 100 mg (has no administration in  time range)  sodium chloride  0.9 % bolus 500 mL (0 mLs Intravenous Stopped 05/04/24 0319)     ED COURSE: Patient's labs show normal hemoglobin.  Normal electrolytes.  Stable chronic kidney disease.  Troponin minimally elevated likely from demand ischemia versus decreased clearance in the setting of chronic kidney disease.  Second troponin pending.  Heart rates improving to the 80s on diltiazem .  Urine shows no sign of infection, dehydration.  Chest x-ray reviewed and interpreted by myself and the radiologist and is clear.   Will discuss with medicine for admission for A-fib with RVR on diltiazem  with slightly elevated troponin.    CONSULTS:  Consulted and discussed patient's case with hospitalist, Dr. Cleatus.  I have recommended admission and consulting physician agrees and will place admission orders.  Patient (and family if present) agree with this plan.   I reviewed all nursing notes, vitals, pertinent previous records.  All labs, EKGs, imaging ordered have been independently reviewed and interpreted by myself.    OUTSIDE RECORDS REVIEWED: Reviewed recent hospital notes.  Patient was recently admitted for subdural hematoma after syncopal event.       FINAL CLINICAL IMPRESSION(S) / ED DIAGNOSES   Final diagnoses:  Atrial fibrillation with RVR (HCC)     Rx / DC Orders   ED Discharge Orders          Ordered    Amb referral to [facility]        05/04/24 0437             Note:  This document was prepared using Dragon voice recognition software and may include unintentional dictation errors.   Aviona Martenson, Josette SAILOR, DO 05/04/24 [phone]  "

## 2024-05-04 NOTE — H&P (Addendum)
 "  History and Physical    Timothy Goodwin FMW:[id number] DOB: 1934/08/29 DOA: 05/04/2024  DOS: the patient was seen and examined on 05/04/2024  PCP: [name], MD   Patient coming from: Home  I have personally briefly reviewed patient's old medical records in [facility]  Chief Complaint: Palpitations  HPI: Timothy Goodwin is a pleasant 89 y.o. male with medical history significant for A-fib on metoprolol  and amiodarone  but not on anticoagulation since December 2025 due to brain bleed, HTN, HLD, CAD, CKD, prostate cancer who presented to ED complaining of feeling unwell started last night.  Patient stated that he felt abnormal and got very shaky.  He stated that he felt like his heart was racing.  Denied any chest pain, fever, cough, congestion, shortness of breath, nausea, vomiting, diarrhea, urinary symptoms.  Patient stated that he took his prescribed medications without any relief.  He is currently wearing a Holter monitor for the last 2 weeks.  EMS reported he was on A-fib with RVR.  No medication was given and route.  Heart rate fluctuating between upper 90s to 120s.  ED Course: Upon arrival to the ED, patient is found to be in A-fib.  Hypertensive at 172/111, tachypneic at 25.  WBC was 9.2, troponin were 47 and 40, urine analysis did not show any signs of infection, chest x-ray did not show any signs of acute infiltrate.  Patient was started on diltiazem  infusion, IV fluid bolus.  Hospitalist service was consulted for evaluation for admission.  Review of Systems:  ROS  All other systems negative except as noted in the HPI.  Past Medical History:  Diagnosis Date   Actinic keratosis    Basal cell carcinoma 11/02/2019   upper back spinal    Carotid artery stenosis    CKD (chronic kidney disease), stage III (HCC)    Coronary artery disease    Gout    Hx of CABG 04/05/2018   2v; LIMA-LAD, SVG-PL   Hyperlipidemia    Hypertension    Left inguinal hernia     NSTEMI (non-ST elevated myocardial infarction) (HCC) 12/25/2013   NSTEMI (non-ST elevated myocardial infarction) (HCC) 03/30/2018   Peripheral vascular disease    Postoperative atrial fibrillation (HCC)    Prostate cancer (HCC) 1995   Right renal artery stenosis     Past Surgical History:  Procedure Laterality Date   CARDIAC CATHETERIZATION N/A 12/27/2013   Location: [facility]; Surgeon: Margie Lovelace, MD   CORONARY ANGIOPLASTY WITH STENT PLACEMENT N/A 2000   OM1 stents (unknown type) placed; [facility]   CORONARY ARTERY BYPASS GRAFT N/A 04/05/2018   2v; LIMA-LAD, SVG-PL; Location: [facility]; Surgeon: Morene Keens, MD   LEFT HEART CATH AND CORONARY ANGIOGRAPHY N/A 03/30/2018   Procedure: LEFT HEART CATH AND CORONARY ANGIOGRAPHY and possible PCI and stent;  Surgeon: Lovelace Cara BIRCH, MD;  Location: [facility] INVASIVE CV LAB;  Service: Cardiovascular;  Laterality: N/A;   PROSTATECTOMY  1995   SMALL BOWEL ENTEROSCOPY  2019   TEE WITH CARDIOVERSION N/A 04/10/2018   Required 3 cardioversions/shocks to briefly restore NSR; A.fib refractory; Location: [facility]   TONSILLECTOMY     XI ROBOTIC ASSISTED INGUINAL HERNIA REPAIR WITH MESH Left 10/30/2020   Procedure: XI ROBOTIC ASSISTED INGUINAL HERNIA REPAIR WITH MESH;  Surgeon: Lane Shope, MD;  Location: [facility] ORS;  Service: General;  Laterality: Left;     reports that he has never smoked. He has never used smokeless tobacco. He reports that he does not  drink alcohol and does not use drugs.  Allergies[1]  Family History  Problem Relation Age of Onset   Prostate cancer Father     Prior to Admission medications  Medication Sig Start Date End Date Taking? Authorizing Provider  allopurinol  (ZYLOPRIM ) 100 MG tablet Take 100 mg by mouth daily. 03/27/16  Yes [provider]  amiodarone  (PACERONE ) 200 MG tablet Take 1 tablet (200 mg total) by mouth daily. Home med. 04/08/24  Yes Awanda City, MD  atorvastatin  (LIPITOR) 10 MG tablet  Take 10 mg by mouth daily. 03/19/24  Yes [provider]  metoprolol  tartrate (LOPRESSOR ) 25 MG tablet Take 1 tablet (25 mg total) by mouth 2 (two) times daily. Home med. 04/08/24  Yes Awanda City, MD  [Paused] apixaban  (ELIQUIS ) 2.5 MG TABS tablet Take 1 tablet (2.5 mg total) by mouth 2 (two) times daily. Patient not taking: Reported on 04/17/2024 Wait to take this until your doctor or other care provider tells you to start again. 11/14/23   Arlon Carliss ORN, DO    Physical Exam: Vitals:   05/04/24 0600 05/04/24 0700 05/04/24 0800 05/04/24 0900  BP: (!) 148/75 (!) 170/94 (!) 172/111 (!) 155/99  Pulse: 75 60 78 91  Resp: 16 14 (!) 25 17  Temp: 97.9 F (36.6 C)     TempSrc: Oral     SpO2: 100% 98% 100% 100%  Weight:      Height:        Physical Exam   Constitutional: Alert, awake, calm, comfortable HEENT: Neck supple Respiratory: Clear to auscultation B/L, no wheezing, no rales.  Cardiovascular: Regular rate and rhythm, no murmurs / rubs / gallops. No extremity edema. 2+ pedal pulses. No carotid bruits.  Abdomen: Soft, no tenderness, Bowel sounds positive.  Musculoskeletal: no clubbing / cyanosis. Good ROM, no contractures. Normal muscle tone.  Skin: no rashes, lesions, ulcers. Neurologic: CN 2-12 grossly intact. Sensation intact, No focal deficit identified Psychiatric: Alert and oriented x 3. Normal mood.    Labs on Admission: I have personally reviewed following labs and imaging studies  CBC: Recent Labs  Lab 05/04/24 0149  WBC 9.2  NEUTROABS 6.7  HGB 12.4*  HCT 39.8  MCV 99.7  PLT 239   Basic Metabolic Panel: Recent Labs  Lab 05/04/24 0149  NA 143  K 4.3  CL 110  CO2 21*  GLUCOSE 121*  BUN 35*  CREATININE 1.95*  CALCIUM  9.4  MG 2.3   GFR: Estimated Creatinine Clearance: 22.3 mL/min (A) (by C-G formula based on SCr of 1.95 mg/dL (H)). Liver Function Tests: Recent Labs  Lab 05/04/24 0149  AST 39  ALT 75*  ALKPHOS 131*  BILITOT 0.5  PROT 6.7   ALBUMIN 3.9   No results for input(s): LIPASE, AMYLASE in the last 168 hours. No results for input(s): AMMONIA in the last 168 hours. Coagulation Profile: No results for input(s): INR, PROTIME in the last 168 hours. Cardiac Enzymes: No results for input(s): CKTOTAL, CKMB, CKMBINDEX, TROPONINI, TROPONINIHS in the last 168 hours. BNP (last 3 results) No results for input(s): BNP in the last 8760 hours. HbA1C: No results for input(s): HGBA1C in the last 72 hours. CBG: No results for input(s): GLUCAP in the last 168 hours. Lipid Profile: No results for input(s): CHOL, HDL, LDLCALC, TRIG, CHOLHDL, LDLDIRECT in the last 72 hours. Thyroid Function Tests: Recent Labs    05/04/24 0149  TSH 5.850*  FREET4 1.40   Anemia Panel: No results for input(s): VITAMINB12, FOLATE, FERRITIN, TIBC, IRON,  RETICCTPCT in the last 72 hours. Urine analysis:    Component Value Date/Time   COLORURINE YELLOW 05/04/2024 0304   APPEARANCEUR CLEAR 05/04/2024 0304   APPEARANCEUR Clear 03/19/2021 0836   LABSPEC 1.017 05/04/2024 0304   PHURINE 5.0 05/04/2024 0304   GLUCOSEU NEGATIVE 05/04/2024 0304   HGBUR NEGATIVE 05/04/2024 0304   BILIRUBINUR NEGATIVE 05/04/2024 0304   BILIRUBINUR Negative 03/19/2021 0836   KETONESUR NEGATIVE 05/04/2024 0304   PROTEINUR 30 (A) 05/04/2024 0304   NITRITE NEGATIVE 05/04/2024 0304   LEUKOCYTESUR NEGATIVE 05/04/2024 0304    Radiological Exams on Admission: I have personally reviewed images DG Chest Portable 1 View Result Date: 05/04/2024 CLINICAL DATA:  Chest pain EXAM: PORTABLE CHEST 1 VIEW COMPARISON:  04/06/2024 FINDINGS: Cardiac shadow is enlarged. Postsurgical changes are noted. Aortic calcifications are again seen. Elevation of left hemidiaphragm is noted. The lungs are otherwise well aerated. No focal infiltrate is seen. No sizable effusion is noted. No acute bony abnormality is seen. IMPRESSION: Chronic changes without  acute abnormality. Electronically Signed   By: Oneil Devonshire M.D.   On: 05/04/2024 02:32    EKG: My personal interpretation of EKG shows: A-fib/flutter with RVR, no ST elevation    Assessment/Plan Principal Problem:   Atrial fibrillation with rapid ventricular response (HCC) Active Problems:   CKD stage 3b, GFR 30-44 ml/min (HCC)   3-vessel coronary artery disease   Hyperlipidemia   S/P CABG x 2    Assessment and Plan: 89 year old male with history of HTN, HLD, A-fib on amiodarone  and metoprolol , CKD 3B, CAD s/p CABG who came into ED complaining of palpitations.  1.  A-fib with RVR - Patient required diltiazem  drip. - Troponins are slightly elevated. - He will be placed in observation for A-fib with RVR requiring Dilt drip. - He recently had echocardiogram in November 2025.  No need for additional echo. - Continue metoprolol , amiodarone  and diltiazem  as needed. - It appears that he had a brain bleed in December 2025 and anticoagulation was held. - The plan is to restart anticoagulation if cleared by neurology when he sees neurology.  He has not seen the neurology yet. - Cardiology consult requested and will follow their recommendation.  2.  Slightly elevated troponin in the setting of CAD - May be related to demand ischemia due to tachycardia. - I will continue his home medications. - Will defer to cardiology for further interventions.  3.  HLD - Resume his home Lipitor  4.  CKD 3B -He appears to be at baseline. - Continue to monitor kidney function - Continue to avoid nephrotoxic medications     DVT prophylaxis: Eliquis  Code Status: Full Code Family Communication: None Disposition Plan: Home Consults called: Cardiology Admission status: Observation, progressive   Nena Rebel, MD [facility] 05/04/2024, 9:05 AM        [1]  Allergies Allergen Reactions   Aspirin  Shortness Of Breath and Swelling   Lisinopril Shortness Of Breath   Ace Inhibitors  Other (See Comments)    Unknown reaction   Nsaids Swelling   Sulfa Antibiotics Swelling and Rash   "

## 2024-05-04 NOTE — Discharge Summary (Signed)
 " Physician Discharge Summary   Patient: Timothy Goodwin MRN: [id number] DOB: 1934/10/01  Admit date:     05/04/2024  Discharge date: 05/04/24  Discharge Physician: Nena Rebel   PCP: [name], MD   Recommendations at discharge:   Continue taking your medication metoprolol  50 twice a day, amiodarone  100 mg daily, diltiazem  120 p.o. daily Follow-up with cardiology as scheduled in 1 to 2 weeks Follow-up with primary care provider as scheduled Come to the emergency room if there is any worsening symptoms.  Discharge Diagnoses: Principal Problem:   Atrial fibrillation with rapid ventricular response (HCC) Active Problems:   CKD stage 3b, GFR 30-44 ml/min (HCC)   3-vessel coronary artery disease   Hyperlipidemia   S/P CABG x 2  Resolved Problems:   * No resolved hospital problems. *  Hospital Course: HPI: Timothy Goodwin is a pleasant 89 y.o. male with medical history significant for A-fib on metoprolol  and amiodarone  but not on anticoagulation since December 2025 due to brain bleed, HTN, HLD, CAD, CKD, prostate cancer who presented to ED complaining of feeling unwell started last night.  Patient stated that he felt abnormal and got very shaky.  He stated that he felt like his heart was racing.  Denied any chest pain, fever, cough, congestion, shortness of breath, nausea, vomiting, diarrhea, urinary symptoms.  Patient stated that he took his prescribed medications without any relief.  He is currently wearing a Holter monitor for the last 2 weeks.  EMS reported he was on A-fib with RVR.  No medication was given and route.  Heart rate fluctuating between upper 90s to 120s.   ED Course: Upon arrival to the ED, patient is found to be in A-fib.  Hypertensive at 172/111, tachypneic at 25.  WBC was 9.2, troponin were 47 and 40, urine analysis did not show any signs of infection, chest x-ray did not show any signs of acute infiltrate.  Patient was started on diltiazem  infusion, IV  fluid bolus.  Hospitalist service was consulted for evaluation for admission.  Assessment and Plan: 89 year old male with history of HTN, HLD, A-fib on amiodarone  and metoprolol , CKD 3B, CAD s/p CABG who came into ED complaining of palpitations. He was initially treated with diltiazem  drip which has been weaned off.  His heart rate has been stable.  Cardiology advised to optimize the medication to metoprolol  50 mg twice daily, amiodarone  100 mg p.o. daily and diltiazem  added 120 mg p.o. daily.  Patient felt better and wanted to go home.  Patient will be discharged home on his home medications with follow-up with cardiology and primary care provider send above     Pain control - [place]  Controlled Substance Reporting System database was reviewed. and patient was instructed, not to drive, operate heavy machinery, perform activities at heights, swimming or participation in water activities or provide baby-sitting services while on Pain, Sleep and Anxiety Medications; until their outpatient Physician has advised to do so again. Also recommended to not to take more than prescribed Pain, Sleep and Anxiety Medications.  Consultants: Cardiology Procedures performed: None Disposition: Home Diet recommendation:  Cardiac diet DISCHARGE MEDICATION: Allergies as of 05/04/2024       Reactions   Aspirin  Shortness Of Breath, Swelling   Lisinopril Shortness Of Breath   Ace Inhibitors Other (See Comments)   Unknown reaction   Nsaids Swelling   Sulfa Antibiotics Swelling, Rash        Medication List     PAUSE taking these medications  apixaban  2.5 MG Tabs tablet Wait to take this until your doctor or other care provider tells you to start again. Due to subdural hematoma. Commonly known as: Eliquis  Take 1 tablet (2.5 mg total) by mouth 2 (two) times daily.       TAKE these medications    allopurinol  100 MG tablet Commonly known as: ZYLOPRIM  Take 100 mg by mouth daily.   amiodarone   200 MG tablet Commonly known as: Pacerone  Take 0.5 tablets (100 mg total) by mouth daily. Home med. What changed: how much to take   atorvastatin  10 MG tablet Commonly known as: LIPITOR Take 10 mg by mouth daily.   diltiazem  120 MG 24 hr capsule Commonly known as: CARDIZEM  CD Take 1 capsule (120 mg total) by mouth daily. Start taking on: May 05, 2024   metoprolol  tartrate 50 MG tablet Commonly known as: LOPRESSOR  Take 1 tablet (50 mg total) by mouth 2 (two) times daily. What changed:  medication strength how much to take additional instructions        Follow-up Information     Callwood, Cara D, MD. Go in 4 day(s).   Specialties: Cardiology, Internal Medicine Why: Appointment scheduled for 05/08/2024 at 8:30 AM Contact information: [address] [place] [address] [phone]                Discharge Exam: Timothy Goodwin   05/04/24 0122  Weight: 64.4 kg   Constitutional: Alert, awake, calm, comfortable HEENT: Neck supple Respiratory: Clear to auscultation B/L, no wheezing, no rales.  Cardiovascular: Regular rate and rhythm, no murmurs / rubs / gallops. No extremity edema. 2+ pedal pulses. No carotid bruits.  Abdomen: Soft, no tenderness, Bowel sounds positive.  Musculoskeletal: no clubbing / cyanosis. Good ROM, no contractures. Normal muscle tone.  Skin: no rashes, lesions, ulcers. Neurologic: CN 2-12 grossly intact. Sensation intact, No focal deficit identified Psychiatric: Alert and oriented x 3. Normal mood.     Condition at discharge: good  The results of significant diagnostics from this hospitalization (including imaging, microbiology, ancillary and laboratory) are listed below for reference.   Imaging Studies: DG Chest Portable 1 View Result Date: 05/04/2024 CLINICAL DATA:  Chest pain EXAM: PORTABLE CHEST 1 VIEW COMPARISON:  04/06/2024 FINDINGS: Cardiac shadow is enlarged. Postsurgical changes are noted. Aortic calcifications are  again seen. Elevation of left hemidiaphragm is noted. The lungs are otherwise well aerated. No focal infiltrate is seen. No sizable effusion is noted. No acute bony abnormality is seen. IMPRESSION: Chronic changes without acute abnormality. Electronically Signed   By: Oneil Devonshire M.D.   On: 05/04/2024 02:32   CT HEAD WO CONTRAST ( ) Result Date: 04/17/2024 EXAM: CT HEAD WITHOUT CONTRAST 04/14/2024 01:20:34 PM TECHNIQUE: CT of the head was performed without the administration of intravenous contrast. Automated exposure control, iterative reconstruction, and/or weight based adjustment of the mA/kV was utilized to reduce the radiation dose to as low as reasonably achievable. COMPARISON: CT head 04/06/2024. CLINICAL HISTORY: Subdural hematoma. FINDINGS: BRAIN AND VENTRICLES: There is an overall similar right frontoparietal convexity hypoattenuating extra-axial fluid collection measuring up to 0.5 cm from the inner table. No significant associated mass effect or midline shift. No hyperattenuating component to suggest recent hemorrhage. No new or increasing intracranial hemorrhage. There are overall mild similar scattered white matter hypodensities which are nonspecific but most commonly represent chronic microvascular ischemic changes. No evidence of acute infarct. No hydrocephalus. ORBITS: No acute abnormality. SINUSES: No acute abnormality. SOFT TISSUES AND SKULL: No acute soft tissue  abnormality. No skull fracture. IMPRESSION: 1. 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 No evidence of new or increasing intracranial hemorrhage. Electronically signed by: Prentice Spade MD 04/17/2024 08:46 AM EST RP Workstation: [id number]   NM Hepato W/EF Result Date: 04/07/2024 EXAM: NM HEPATOBILLARY SCAN 04/07/2024 10:26:42 AM TECHNIQUE: RADIOPHARMACEUTICAL: 5.48 mCi Tc-63m mebrofenin  Anterior planar images  of the abdomen and pelvis were obtained over approximately 1 hour. Ensure plus or equivalent fatty meal was given by mouth. Dynamic imaging of the abdomen is obtained, and quantitative analysis is performed. COMPARISON: None available. CLINICAL HISTORY: Cholecystitis. FINDINGS: Homogenous uptake within the liver. Normal clearance of the blood pool. Appropriate excretion into the biliary system. Activity is visualized in the small bowel. Activity is visualized in the gallbladder. Normal filling of the gallbladder. Gallbladder ejection fraction measures: 82% Normal value is >33% for Ensure protocol. Patient reported symptoms during infusion: None. IMPRESSION: 1. Normal filling of the gallbladder. 2.  Gallbladder ejection fraction of 82%. Electronically signed by: Norleen Boxer MD 04/07/2024 10:53 AM EST RP Workstation: [id number]   US  ABDOMEN LIMITED RUQ (LIVER/GB) Result Date: 04/06/2024 CLINICAL DATA:  [id number] Transaminitis [id number] EXAM: ULTRASOUND ABDOMEN LIMITED RIGHT UPPER QUADRANT COMPARISON:  02/23/2024 FINDINGS: Gallbladder: Decompressed containing multiple shadowing gallstones. Circumferential wall thickening with gallbladder wall edema. Small amount of pericholecystic fluid is also present. No sonographic Murphy's sign noted by sonographer. Common bile duct: Diameter: 6 mm Liver: Normal echogenicity. No focal lesion identified. No intrahepatic biliary ductal dilation. Portal vein is patent on color Doppler imaging with normal direction of blood flow towards the liver. Right Kidney: Partially visualized. No mass. No hydronephrosis or nephrolithiasis. Other: None. IMPRESSION: Decompressed gallbladder containing multiple gallstones. Mild gallbladder wall thickening and edema with trace pericholecystic fluid. In the absence of a sonographic Murphy's sign, the study is equivocal for acute cholecystitis. These changes could be due to acute hepatitis, related to intra-abdominal inflammation, hypoproteinemia, or  volume overload from CHF or renal failure. Laboratory correlation recommended. Electronically Signed   By: Rogelia Myers M.D.   On: 04/06/2024 14:10   DG Humerus Left Result Date: 04/06/2024 CLINICAL DATA:  mid shaft pain (mild) after fall EXAM: LEFT HUMERUS - 2+ VIEW COMPARISON:  None Available. FINDINGS: Osteopenia.No acute fracture or dislocation. There is no evidence of arthropathy or other focal bone abnormality. Soft tissues are unremarkable. IMPRESSION: No acute fracture or dislocation. Electronically Signed   By: Rogelia Myers M.D.   On: 04/06/2024 13:34   CT Cervical Spine Wo Contrast Result Date: 04/06/2024 CLINICAL DATA:  Syncopal episode with fall. EXAM: CT CERVICAL SPINE WITHOUT CONTRAST TECHNIQUE: Multidetector CT imaging of the cervical spine was performed without intravenous contrast. Multiplanar CT image reconstructions were also generated. RADIATION DOSE REDUCTION: This exam was performed according to the departmental dose-optimization program which includes automated exposure control, adjustment of the mA and/or kV according to patient size and/or use of iterative reconstruction technique. COMPARISON:  02/23/2024 FINDINGS: Alignment: No posttraumatic subluxation. Subtle stable degenerative posterior subluxation of C3 on C4 and of C5 and C6. Skull base and vertebrae: Moderate spondylosis of the cervical spine to include uncovertebral joint spurring and facet arthropathy. Vertebral body heights are maintained. Stable minimally displaced left C7 transverse process fracture. No additional fractures visualized. Moderate bilateral neural foraminal narrowing at the C3-4 level, C4-5 and C5-6 levels and to lesser extent at the C6-7 level. Soft tissues and spinal canal: No prevertebral fluid or swelling. No visible canal  hematoma. Disc levels: Moderate disc space narrowing from the C3-4 level to the C6-7 level. Upper chest: No acute findings. Other: Old left posterior first and second rib  fractures. IMPRESSION: 1. Stable minimally displaced left C7 transverse process fracture. No additional fractures visualized. 2. Moderate spondylosis of the cervical spine with multilevel disc disease and bilateral neural foraminal narrowing as described. Electronically Signed   By: Toribio Agreste M.D.   On: 04/06/2024 12:15   CT Chest Wo Contrast Result Date: 04/06/2024 CLINICAL DATA:  Left rib pain after multiple falls EXAM: CT CHEST WITHOUT CONTRAST TECHNIQUE: Multidetector CT imaging of the chest was performed following the standard protocol without IV contrast. RADIATION DOSE REDUCTION: This exam was performed according to the departmental dose-optimization program which includes automated exposure control, adjustment of the mA and/or kV according to patient size and/or use of iterative reconstruction technique. COMPARISON:  February 23, 2024 FINDINGS: Cardiovascular: Status post coronary artery bypass graft. Aortic atherosclerosis. No evidence of thoracic aortic aneurysm. Normal cardiac size. No pericardial effusion. Mediastinum/Nodes: No enlarged mediastinal or axillary lymph nodes. Thyroid gland, trachea, and esophagus demonstrate no significant findings. Lungs/Pleura: No pneumothorax is noted. Minimal left pleural effusion is noted with associated mild left posterior basilar atelectasis or infiltrate. 5 mm nodule is noted in right middle lobe best seen on image number 115 of series 3. This is unchanged compared to prior exam. 3 mm nodule is noted in left upper lobe best seen on image number 86 of series 3. This is also unchanged. Upper Abdomen: No acute abnormality. Musculoskeletal: No chest wall mass or suspicious bone lesions identified. IMPRESSION: 1. Minimal left pleural effusion is noted with associated mild left posterior basilar atelectasis or infiltrate. 2. Stable bilateral pulmonary nodules are noted, the largest measuring 5 mm in the right middle lobe. No follow-up needed if patient is low-risk  (and has no known or suspected primary neoplasm). Non-contrast chest CT can be considered in 12 months if patient is high-risk. This recommendation follows the consensus statement: Guidelines for Management of Incidental Pulmonary Nodules Detected on CT Images: From the Fleischner Society 2017; Radiology 2017; 284:228-243. 3. Status post coronary artery bypass graft. 4. Aortic atherosclerosis. Aortic Atherosclerosis (ICD10-I70.0). Electronically Signed   By: Lynwood Landy Raddle M.D.   On: 04/06/2024 12:14   DG Ribs Unilateral W/Chest Left Result Date: 04/06/2024 CLINICAL DATA:  rib injury EXAM: LEFT RIBS AND CHEST - 3+ VIEW COMPARISON:  03/08/2024 FINDINGS: Sternotomy wires and CABG markers. Elevation of the left hemidiaphragm with left basilar atelectasis. Low lung volumes with bronchovascular crowding. No focal airspace consolidation, pleural effusion, or pneumothorax. No cardiomegaly. Subtle cortical irregularity of the anterior left eighth rib. IMPRESSION: Subtle cortical irregularity of the anterior left eighth rib, worrisome for a nondisplaced fracture. No pneumothorax. Electronically Signed   By: Rogelia Myers M.D.   On: 04/06/2024 11:49   CT HEAD WO CONTRAST Result Date: 04/06/2024 EXAM: CT HEAD WITHOUT CONTRAST 04/06/2024 10:54:37 AM TECHNIQUE: CT of the head was performed without the administration of intravenous contrast. Automated exposure control, iterative reconstruction, and/or weight based adjustment of the mA/kV was utilized to reduce the radiation dose to as low as reasonably achievable. COMPARISON: 02/23/2024 CLINICAL HISTORY: Head trauma, moderate-severe Pt c/o syncopal episode, reports he went to stand up and woke up on the floor. Pt states he has had 2 falls. Pt c/o L rib pain. Pt has an abrasion to the L side of his head, FINDINGS: BRAIN AND VENTRICLES: New 4 mm thick low-density subacute to  chronic right subdural hematoma. No acute extraaxial fluid collection. Patchy and confluent areas  of decreased attenuation are noted throughout the deep and periventricular white matter of the cerebral hemispheres bilaterally suggestive of chronic microvascular ischemic changes. Cerebral ventricle sizes are concordant with the degree of cerebral volume loss. Atrophy and chronic small vessel disease changes are also noted. No evidence of acute infarct. No hydrocephalus. No mass effect or midline shift. ORBITS: No acute abnormality. SINUSES: No acute abnormality. SOFT TISSUES AND SKULL: No acute soft tissue abnormality. No skull fracture. IMPRESSION: 1. Subacute to chronic 4 mm right subdural hematoma. No acute component identified. 2. No acute findings. Electronically signed by: Morgane Naveau MD 04/06/2024 11:11 AM EST RP Workstation: [id number]    Microbiology: Results for orders placed or performed during the hospital encounter of 04/06/24  Resp panel by RT-PCR (RSV, Flu A&B, Covid) Anterior Nasal Swab     Status: None   Collection Time: 04/06/24  2:48 PM   Specimen: Anterior Nasal Swab  Result Value Ref Range Status   SARS Coronavirus 2 by RT PCR NEGATIVE NEGATIVE Final    Comment: (NOTE) SARS-CoV-2 target nucleic acids are NOT DETECTED.  The SARS-CoV-2 RNA is generally detectable in upper respiratory specimens during the acute phase of infection. The lowest concentration of SARS-CoV-2 viral copies this assay can detect is 138 copies/mL. A negative result does not preclude SARS-Cov-2 infection and should not be used as the sole basis for treatment or other patient management decisions. A negative result may occur with  improper specimen collection/handling, submission of specimen other than nasopharyngeal swab, presence of viral mutation(s) within the areas targeted by this assay, and inadequate number of viral copies(<138 copies/mL). A negative result must be combined with clinical observations, patient history, and epidemiological information. The expected result is Negative.  Fact  Sheet for Patients:  bloggercourse.com  Fact Sheet for Healthcare Providers:  seriousbroker.it  This test is no t yet approved or cleared by the [place]  FDA and  has been authorized for detection and/or diagnosis of SARS-CoV-2 by FDA under an Emergency Use Authorization (EUA). This EUA will remain  in effect (meaning this test can be used) for the duration of the COVID-19 declaration under Section 564(b)(1) of the Act, 21 U.S.C.section 360bbb-3(b)(1), unless the authorization is terminated  or revoked sooner.       Influenza A by PCR NEGATIVE NEGATIVE Final   Influenza B by PCR NEGATIVE NEGATIVE Final    Comment: (NOTE) The Xpert Xpress SARS-CoV-2/FLU/RSV plus assay is intended as an aid in the diagnosis of influenza from Nasopharyngeal swab specimens and should not be used as a sole basis for treatment. Nasal washings and aspirates are unacceptable for Xpert Xpress SARS-CoV-2/FLU/RSV testing.  Fact Sheet for Patients: bloggercourse.com  Fact Sheet for Healthcare Providers: seriousbroker.it  This test is not yet approved or cleared by the [place]  FDA and has been authorized for detection and/or diagnosis of SARS-CoV-2 by FDA under an Emergency Use Authorization (EUA). This EUA will remain in effect (meaning this test can be used) for the duration of the COVID-19 declaration under Section 564(b)(1) of the Act, 21 U.S.C. section 360bbb-3(b)(1), unless the authorization is terminated or revoked.     Resp Syncytial Virus by PCR NEGATIVE NEGATIVE Final    Comment: (NOTE) Fact Sheet for Patients: bloggercourse.com  Fact Sheet for Healthcare Providers: seriousbroker.it  This test is not yet approved or cleared by the [place]  FDA and has been authorized for detection and/or diagnosis of SARS-CoV-2  by FDA under an  Emergency Use Authorization (EUA). This EUA will remain in effect (meaning this test can be used) for the duration of the COVID-19 declaration under Section 564(b)(1) of the Act, 21 U.S.C. section 360bbb-3(b)(1), unless the authorization is terminated or revoked.  Performed at [facility], [address] [place], [place] [address]     Labs: CBC: Recent Labs  Lab 05/04/24 0149  WBC 9.2  NEUTROABS 6.7  HGB 12.4*  HCT 39.8  MCV 99.7  PLT 239   Basic Metabolic Panel: Recent Labs  Lab 05/04/24 0149  NA 143  K 4.3  CL 110  CO2 21*  GLUCOSE 121*  BUN 35*  CREATININE 1.95*  CALCIUM  9.4  MG 2.3   Liver Function Tests: Recent Labs  Lab 05/04/24 0149  AST 39  ALT 75*  ALKPHOS 131*  BILITOT 0.5  PROT 6.7  ALBUMIN 3.9   CBG: No results for input(s): GLUCAP in the last 168 hours.  Discharge time spent: greater than 30 minutes.  Signed: Nena Rebel, MD [facility] 05/04/2024 "

## 2024-05-04 NOTE — ED Notes (Signed)
 Pt ambulatory independently from room 7 down hallway and back. Pt tolerated activity without difficulty.

## 2024-05-11 ENCOUNTER — Ambulatory Visit (HOSPITAL_COMMUNITY): Admitting: Physician Assistant

## 2024-05-18 ENCOUNTER — Non-Acute Institutional Stay: Admitting: Nurse Practitioner

## 2024-05-18 ENCOUNTER — Encounter: Payer: Self-pay | Admitting: Nurse Practitioner

## 2024-05-18 ENCOUNTER — Ambulatory Visit
Admission: RE | Admit: 2024-05-18 | Discharge: 2024-05-18 | Disposition: A | Discharge status: Home / Self Care | Source: Ambulatory Visit | Attending: Neurosurgery | Admitting: Neurosurgery

## 2024-05-18 VITALS — BP 124/72 | HR 59 | Temp 98.2°F | Ht 64.0 in | Wt 147.0 lb

## 2024-05-18 DIAGNOSIS — M109 Gout, unspecified: Secondary | ICD-10-CM | POA: Diagnosis not present

## 2024-05-18 DIAGNOSIS — S065XAA Traumatic subdural hemorrhage with loss of consciousness status unknown, initial encounter: Secondary | ICD-10-CM | POA: Insufficient documentation

## 2024-05-18 DIAGNOSIS — I10 Essential (primary) hypertension: Secondary | ICD-10-CM | POA: Diagnosis not present

## 2024-05-18 DIAGNOSIS — Z8546 Personal history of malignant neoplasm of prostate: Secondary | ICD-10-CM | POA: Diagnosis not present

## 2024-05-18 DIAGNOSIS — I4811 Longstanding persistent atrial fibrillation: Secondary | ICD-10-CM | POA: Diagnosis not present

## 2024-05-18 DIAGNOSIS — N1832 Chronic kidney disease, stage 3b: Secondary | ICD-10-CM | POA: Diagnosis not present

## 2024-05-18 DIAGNOSIS — Z85828 Personal history of other malignant neoplasm of skin: Secondary | ICD-10-CM | POA: Diagnosis not present

## 2024-05-18 DIAGNOSIS — I25810 Atherosclerosis of coronary artery bypass graft(s) without angina pectoris: Secondary | ICD-10-CM | POA: Diagnosis not present

## 2024-05-18 MED ORDER — AMIODARONE HCL 200 MG PO TABS: 200.0000 mg | ORAL_TABLET | Freq: Every day | ORAL | Status: AC
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
## 2024-05-25 ENCOUNTER — Ambulatory Visit
Admission: RE | Admit: 2024-05-25 | Discharge: 2024-05-25 | Disposition: A | Source: Ambulatory Visit | Attending: Internal Medicine | Admitting: Internal Medicine

## 2024-05-25 ENCOUNTER — Other Ambulatory Visit: Payer: Self-pay | Admitting: Internal Medicine

## 2024-05-25 ENCOUNTER — Ambulatory Visit: Admitting: Neurosurgery

## 2024-05-25 ENCOUNTER — Encounter: Payer: Self-pay | Admitting: Neurosurgery

## 2024-05-25 VITALS — BP 130/70 | Wt 149.8 lb

## 2024-05-25 DIAGNOSIS — R21 Rash and other nonspecific skin eruption: Secondary | ICD-10-CM

## 2024-05-25 DIAGNOSIS — S065XAA Traumatic subdural hemorrhage with loss of consciousness status unknown, initial encounter: Secondary | ICD-10-CM

## 2024-05-25 DIAGNOSIS — R609 Edema, unspecified: Secondary | ICD-10-CM

## 2024-05-25 NOTE — Progress Notes (Signed)
 "  Referring Physician:  [name], MD [address] [facility] [place],  [place] [address]  Primary Physician:  Caro Harlene POUR, NP  History of Present Illness: 05/25/2024 Timothy Goodwin presents today for 4 week follow up. He is doing well without headaches or neurologic symptoms. He has not yet restarted his Eliquis  but has a cardiology appointment later this morning.   04/17/24 Timothy Goodwin is here today for follow up of SDH. He was seen in the hospital on 04/06/24 by Dr. Claudene for evaluation after a fall due to a syncopal event. He was on Eliquis  at the time. He was neurologically asymptomatic.  Today he is working with cardiology to figure out the origin of his syncope.  He reports he is without headaches.  Review of Systems:  A 10 point review of systems is negative, except for the pertinent positives and negatives detailed in the HPI.  Past Medical History: Past Medical History:  Diagnosis Date   Actinic keratosis    Basal cell carcinoma 11/02/2019   upper back spinal    Carotid artery stenosis    CKD (chronic kidney disease), stage III (HCC)    Coronary artery disease    Gout    Hx of CABG 04/05/2018   2v; LIMA-LAD, SVG-PL   Hyperlipidemia    Hypertension    Left inguinal hernia    NSTEMI (non-ST elevated myocardial infarction) (HCC) 12/25/2013   NSTEMI (non-ST elevated myocardial infarction) (HCC) 03/30/2018   Peripheral vascular disease    Postoperative atrial fibrillation (HCC)    Prostate cancer (HCC) 1995   Right renal artery stenosis     Past Surgical History: Past Surgical History:  Procedure Laterality Date   CARDIAC CATHETERIZATION N/A 12/27/2013   Location: [facility]; Surgeon: Margie Lovelace, MD   CORONARY ANGIOPLASTY WITH STENT PLACEMENT N/A 2000   OM1 stents (unknown type) placed; Location: [facility]   CORONARY ARTERY BYPASS GRAFT N/A 04/05/2018   2v; LIMA-LAD, SVG-PL; Location: [facility]; Surgeon: Morene Keens, MD    LEFT HEART CATH AND CORONARY ANGIOGRAPHY N/A 03/30/2018   Procedure: LEFT HEART CATH AND CORONARY ANGIOGRAPHY and possible PCI and stent;  Surgeon: Lovelace Cara BIRCH, MD;  Location: [facility] INVASIVE CV LAB;  Service: Cardiovascular;  Laterality: N/A;   PROSTATECTOMY  1995   SMALL BOWEL ENTEROSCOPY  2019   TEE WITH CARDIOVERSION N/A 04/10/2018   Required 3 cardioversions/shocks to briefly restore NSR; A.fib refractory; Location: [facility]   TONSILLECTOMY     XI ROBOTIC ASSISTED INGUINAL HERNIA REPAIR WITH MESH Left 10/30/2020   Procedure: XI ROBOTIC ASSISTED INGUINAL HERNIA REPAIR WITH MESH;  Surgeon: Lane Shope, MD;  Location: [facility] ORS;  Service: General;  Laterality: Left;    Allergies: Allergies as of 05/25/2024 - Review Complete 05/25/2024  Allergen Reaction Noted   Aspirin  Shortness Of Breath and Swelling    Lisinopril Shortness Of Breath    Nsaids Swelling 03/21/2012   Ace inhibitors Other (See Comments) 05/17/2019   Sulfa antibiotics Swelling and Rash 03/21/2012    Medications: Outpatient Encounter Medications as of 05/25/2024  Medication Sig   allopurinol  (ZYLOPRIM ) 100 MG tablet Take 100 mg by mouth daily.   amiodarone  (PACERONE ) 200 MG tablet Take 1 tablet (200 mg total) by mouth daily. Home med.   amLODipine  (NORVASC ) 5 MG tablet Take 5 mg by mouth 2 (two) times daily.   atorvastatin  (LIPITOR) 10 MG tablet Take 10 mg by mouth daily.   metoprolol  succinate (TOPROL -XL) 50 MG 24 hr tablet  Take 50 mg by mouth 2 (two) times daily.   [Paused] apixaban  (ELIQUIS ) 2.5 MG TABS tablet Take 1 tablet (2.5 mg total) by mouth 2 (two) times daily. (Patient not taking: Reported on 05/18/2024)   No facility-administered encounter medications on file as of 05/25/2024.    Social History: Social History[1]  Family Medical History: Family History  Problem Relation Age of Onset   Cancer Mother 31       ovarian   Prostate cancer Father     Physical Examination: Today's Vitals   05/25/24  0924  BP: 130/70  Weight: 149 lb 12.8 oz (67.9 kg)  PainSc: 2   PainLoc: Leg   Body mass index is 25.71 kg/m.   NEUROLOGICAL:     Awake, alert, oriented to person, place, and time.  Speech is clear and fluent. Fund of knowledge is appropriate.   Cranial Nerves: Pupils equal round and reactive to light.  Facial tone is symmetric.  Facial sensation is symmetric. No pronator drift  Strength: 5/5 throughout upper and lower extremities.   Bilateral upper and lower extremity sensation is intact to light touch.    Gait is normal.     Medical Decision Making  Imaging: CT head 05/18/24 FINDINGS:   BRAIN AND VENTRICLES: The small subdural hematoma over the right frontoparietal convexity on the prior study has resolved. No new intracranial hemorrhage, acute infarct, mass, midline shift, hydrocephalus, or extra-axial collection is identified. There is mild cerebral atrophy. Cerebral white matter hypodensities are unchanged and nonspecific but compatible with mild chronic small vessel ischemic disease. Calcified atherosclerosis at the skull base.   ORBITS: No acute abnormality.   SINUSES: No acute abnormality.   SOFT TISSUES AND SKULL: No acute soft tissue abnormality. No skull fracture.   IMPRESSION: 1. Resolved subdural hematoma over the right frontoparietal convexity. 2. No new intracranial abnormality. 3. Cerebral Atrophy (ICD10-G31.9).   Electronically signed by: Dasie Hamburg MD 05/22/2024 11:00 AM EST RP Workstation: [id number]  CT head 04/14/24 IMPRESSION: 1. 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 No evidence of new or increasing intracranial hemorrhage.   Electronically signed by: Prentice Spade MD 04/17/2024 08:46 AM EST RP Workstation: [id number]  I have personally reviewed the images and agree with the above interpretation.  Assessment  and Plan: Timothy Goodwin is a pleasant 89 y.o. male presenting today for 4 week f/u of SDH. His most recent CT shows resolution of his SDH. He is neurologically well. I will defer the decision to resume Eliquis  to cardiology but he is cleared to do so from a neurosurgical standpoint should cardiology deem the benefits outweigh the risks. I will see him going forward on an as needed basis. He expressed understanding and was in agreement with this plan  Thank you for involving me in the care of this patient.   I spent a total of 30 minutes in both face-to-face and non-face-to-face activities for this visit on the date of this encounter.   [facility]      [1]  Social History Tobacco Use   Smoking status: Never   Smokeless tobacco: Never  Vaping Use   Vaping status: Never Used  Substance Use Topics   Alcohol use: Never   Drug use: No   "

## 2024-05-26 ENCOUNTER — Encounter: Payer: Self-pay | Admitting: Emergency Medicine

## 2024-05-26 ENCOUNTER — Other Ambulatory Visit: Payer: Self-pay

## 2024-05-26 ENCOUNTER — Emergency Department
Admission: EM | Admit: 2024-05-26 | Discharge: 2024-05-26 | Disposition: A | Discharge status: Home / Self Care | Source: Home / Self Care | Attending: Emergency Medicine | Admitting: Emergency Medicine

## 2024-05-26 ENCOUNTER — Emergency Department

## 2024-05-26 DIAGNOSIS — J189 Pneumonia, unspecified organism: Secondary | ICD-10-CM

## 2024-05-26 DIAGNOSIS — R079 Chest pain, unspecified: Secondary | ICD-10-CM

## 2024-05-26 LAB — BASIC METABOLIC PANEL WITH GFR
Anion gap: 13 (ref 5–15)
BUN: 56 mg/dL — ABNORMAL HIGH (ref 8–23)
CO2: 21 mmol/L — ABNORMAL LOW (ref 22–32)
Calcium: 9.2 mg/dL (ref 8.9–10.3)
Chloride: 107 mmol/L (ref 98–111)
Creatinine, Ser: 2.23 mg/dL — ABNORMAL HIGH (ref 0.61–1.24)
GFR, Estimated: 28 mL/min — ABNORMAL LOW
Glucose, Bld: 193 mg/dL — ABNORMAL HIGH (ref 70–99)
Potassium: 4.8 mmol/L (ref 3.5–5.1)
Sodium: 140 mmol/L (ref 135–145)

## 2024-05-26 LAB — PROTIME-INR
INR: 1.3 — ABNORMAL HIGH (ref 0.8–1.2)
Prothrombin Time: 17.1 s — ABNORMAL HIGH (ref 11.4–15.2)

## 2024-05-26 LAB — CBC
HCT: 38.5 % — ABNORMAL LOW (ref 39.0–52.0)
Hemoglobin: 12.1 g/dL — ABNORMAL LOW (ref 13.0–17.0)
MCH: 30.7 pg (ref 26.0–34.0)
MCHC: 31.4 g/dL (ref 30.0–36.0)
MCV: 97.7 fL (ref 80.0–100.0)
Platelets: 221 10*3/uL (ref 150–400)
RBC: 3.94 MIL/uL — ABNORMAL LOW (ref 4.22–5.81)
RDW: 14.7 % (ref 11.5–15.5)
WBC: 9.1 10*3/uL (ref 4.0–10.5)
nRBC: 0 % (ref 0.0–0.2)

## 2024-05-26 LAB — TROPONIN T, HIGH SENSITIVITY
Troponin T High Sensitivity: 43 ng/L — ABNORMAL HIGH (ref 0–19)
Troponin T High Sensitivity: 40 ng/L — ABNORMAL HIGH (ref 0–19)

## 2024-05-26 MED ORDER — AZITHROMYCIN 500 MG PO TABS
500.0000 mg | ORAL_TABLET | Freq: Once | ORAL | Status: AC
  Administered 2024-05-26: 500 mg via ORAL
  Filled 2024-05-26: qty 1

## 2024-05-26 MED ORDER — CEPHALEXIN 500 MG PO CAPS: 500.0000 mg | ORAL_CAPSULE | Freq: Two times a day (BID) | ORAL | 0 refills | Status: AC

## 2024-05-26 MED ORDER — AZITHROMYCIN 250 MG PO TABS: 250.0000 mg | ORAL_TABLET | Freq: Every day | ORAL | 0 refills | Status: AC

## 2024-05-26 NOTE — ED Notes (Signed)
 See triage note  Presents with some chest pain States pain started about 2 this afternoon   States pain lasted for about 30 mins  Denies any cough   fever  Denies any pain at present

## 2024-05-26 NOTE — ED Triage Notes (Signed)
 First nurse note: pt to ED [facility] from [facility] for chest pain/tremors. Reports itching since taking new meds for a fib.

## 2024-05-26 NOTE — Discharge Instructions (Signed)
 Your workup today has shown reassuring results.  Your x-ray did show possible early or mild pneumonia.  You have been covered with an antibiotic as a precaution.  Please begin taking your antibiotic tomorrow 05/27/2024 for the next 4 days.  Please follow-up with your doctor on Monday for recheck/reevaluation.  Return to the emergency department for any return of/worsening pain any trouble breathing or any other symptom personally concerning to yourself.

## 2024-05-26 NOTE — ED Provider Notes (Addendum)
 "  [facility] Provider Note    Event Date/Time   First MD Initiated Contact with Patient 05/26/24 1729     (approximate)  History   Chief Complaint: Chest Pain  HPI  Timothy Goodwin is a 89 y.o. male with a past medical history of CKD, hypertension, hyperlipidemia, CAD, atrial fibrillation on Eliquis , presents to the emergency department for chest pain.  According to the patient earlier today he was walking, states when he stopped walking he noticed he was having some chest discomfort.  He is not sure if it was due to the walking or not.  Patient states he came to the emergency department however by the time he arrived to the emergency department states the chest pain was largely resolved.  Patient denies any chest pain here.  Did state some shortness of breath earlier today but denies any shortness of breath myself.  No nausea or diaphoresis at any point.  Physical Exam   Triage Vital Signs: ED Triage Vitals  Encounter Vitals Group     BP 05/26/24 1551 (!) 160/72     Girls Systolic BP Percentile --      Girls Diastolic BP Percentile --      Boys Systolic BP Percentile --      Boys Diastolic BP Percentile --      Pulse Rate 05/26/24 1550 68     Resp 05/26/24 1550 18     Temp 05/26/24 1550 99.3 F (37.4 C)     Temp src --      SpO2 05/26/24 1550 97 %     Weight 05/26/24 1549 149 lb 11.1 oz (67.9 kg)     Height 05/26/24 1549 5' 4 (1.626 m)     Head Circumference --      Peak Flow --      Pain Score 05/26/24 1549 0     Pain Loc --      Pain Education --      Exclude from Growth Chart --     Most recent vital signs: Vitals:   05/26/24 1550 05/26/24 1551  BP:  (!) 160/72  Pulse: 68   Resp: 18   Temp: 99.3 F (37.4 C)   SpO2: 97%     General: Awake, no distress.  CV:  Good peripheral perfusion.  Regular rate and rhythm  Resp:  Normal effort.  Equal breath sounds bilaterally.  Abd:  No distention.  Soft, nontender.  No rebound or  guarding.  ED Results / Procedures / Treatments   EKG  EKG viewed and interpreted by myself shows what appears most consistent with atrial fibrillation versus sinus rhythm with PACs at 73 bpm the narrow QRS, normal axis, normal intervals, nonspecific ST changes.  RADIOLOGY  I have reviewed and interpreted the chest x-ray images.  No obvious consolidation seen to my evaluation. Radiology has read the x-ray as a mild patchy opacity in the right side concerning for mild pneumonia.   MEDICATIONS ORDERED IN ED: Medications - No data to display   IMPRESSION / MDM / ASSESSMENT AND PLAN / ED COURSE  I reviewed the triage vital signs and the nursing notes.  Patient's presentation is most consistent with acute presentation with potential threat to life or bodily function.  Patient presents to the emergency department for chest pain.  Overall the patient appears well, vital signs are reassuring.  Does have a borderline low-grade temperature 99.3.  Patient's lab work shows a reassuring CBC chemistry shows no significant  change from baseline with a creatinine of 2.2.  Troponin is slightly elevated however largely unchanged from historical values.  Given the slight elevation in the patient's chest pain although now resolved we will obtain a repeat troponin as a precaution.  Patient's chest x-ray shows possible mild right-sided pneumonia which could represent an early infection although patient denies any cough does have a borderline low-grade temperature.  Lungs the patient's repeat troponin is negative as the patient is symptom-free I believe we could cover the patient with antibiotics and discharged to follow-up with his primary care doctor.  Patient is agreeable to this plan of care as well.  Patient's repeat troponin is negative/unchanged.  Given the patient's reassuring workup we will discharge with Zithromax  to cover for possible pneumonia.  Patient has now developed a fever to 101.2.  Given this  I discussed with the patient coming into the hospital, he states he would still prefer to go home.  We will call him in a a prescription for Keflex  as well for double coverage.  I discussed with the patient using Tylenol  plenty of fluids and very strict return precautions should he feel any worse tomorrow he should return to the emergency department for repeat evaluation and consideration of admission.  Patient agreeable to plan of care.  FINAL CLINICAL IMPRESSION(S) / ED DIAGNOSES   Chest pain Right-sided pneumonia  Note:  This document was prepared using Dragon voice recognition software and may include unintentional dictation errors.   Dorothyann Drivers, MD 05/26/24 [id number]    Dorothyann Drivers, MD 05/26/24 1942  "

## 2024-05-26 NOTE — ED Triage Notes (Signed)
 Pt complains of chest pains that started around 230 and last approx 30 mins. Denies any pain at this time.

## 2024-05-26 NOTE — ED Notes (Signed)
Pt calling daughter for ride 

## 2024-05-26 NOTE — ED Provider Triage Note (Signed)
 Emergency Medicine Provider Triage Evaluation Note  Timothy Goodwin , a 89 y.o. male  was evaluated in triage.  Pt complains of chest pain that started about 2:30 that has since resolved. Unsure how long pain lasted. No pain at present.  Physical Exam  There were no vitals taken for this visit. Gen:   Awake, no distress   Resp:  Normal effort  MSK:   Moves extremities without difficulty  Other:  Left arm tremor noted.  Medical Decision Making  Medically screening exam initiated at 3:48 PM.  Appropriate orders placed.  Timothy Goodwin was informed that the remainder of the evaluation will be completed by another provider, this initial triage assessment does not replace that evaluation, and the importance of remaining in the ED until their evaluation is complete.  Chest pain protocol started.    Timothy Kirk NOVAK, FNP 05/26/24 1550

## 2024-05-26 NOTE — ED Notes (Signed)
 Son on way to pick patient up.

## 2024-06-01 ENCOUNTER — Encounter: Admitting: Nurse Practitioner

## 2024-08-22 ENCOUNTER — Ambulatory Visit: Admitting: Dermatology
